# Patient Record
Sex: Male | Born: 1945 | Race: White | Hispanic: No | Marital: Married | Smoking: Former smoker
Health system: Southern US, Community
[De-identification: ages and names within clinical notes are randomized; demographics above are authoritative.]

## PROBLEM LIST (undated history)

## (undated) DIAGNOSIS — I219 Acute myocardial infarction, unspecified: Secondary | ICD-10-CM

## (undated) DIAGNOSIS — I10 Essential (primary) hypertension: Secondary | ICD-10-CM

## (undated) DIAGNOSIS — E039 Hypothyroidism, unspecified: Secondary | ICD-10-CM

## (undated) DIAGNOSIS — G629 Polyneuropathy, unspecified: Secondary | ICD-10-CM

## (undated) DIAGNOSIS — M109 Gout, unspecified: Secondary | ICD-10-CM

## (undated) DIAGNOSIS — T7840XA Allergy, unspecified, initial encounter: Secondary | ICD-10-CM

## (undated) DIAGNOSIS — I639 Cerebral infarction, unspecified: Secondary | ICD-10-CM

## (undated) DIAGNOSIS — E669 Obesity, unspecified: Secondary | ICD-10-CM

## (undated) DIAGNOSIS — E785 Hyperlipidemia, unspecified: Secondary | ICD-10-CM

## (undated) DIAGNOSIS — M1990 Unspecified osteoarthritis, unspecified site: Secondary | ICD-10-CM

## (undated) DIAGNOSIS — R7303 Prediabetes: Secondary | ICD-10-CM

## (undated) DIAGNOSIS — K219 Gastro-esophageal reflux disease without esophagitis: Secondary | ICD-10-CM

## (undated) DIAGNOSIS — K635 Polyp of colon: Secondary | ICD-10-CM

## (undated) DIAGNOSIS — N200 Calculus of kidney: Secondary | ICD-10-CM

## (undated) DIAGNOSIS — N289 Disorder of kidney and ureter, unspecified: Secondary | ICD-10-CM

## (undated) DIAGNOSIS — E1169 Type 2 diabetes mellitus with other specified complication: Secondary | ICD-10-CM

## (undated) DIAGNOSIS — K760 Fatty (change of) liver, not elsewhere classified: Secondary | ICD-10-CM

## (undated) DIAGNOSIS — G709 Myoneural disorder, unspecified: Secondary | ICD-10-CM

## (undated) DIAGNOSIS — K5792 Diverticulitis of intestine, part unspecified, without perforation or abscess without bleeding: Secondary | ICD-10-CM

## (undated) DIAGNOSIS — I2510 Atherosclerotic heart disease of native coronary artery without angina pectoris: Secondary | ICD-10-CM

## (undated) DIAGNOSIS — E7800 Pure hypercholesterolemia, unspecified: Secondary | ICD-10-CM

## (undated) DIAGNOSIS — C4491 Basal cell carcinoma of skin, unspecified: Secondary | ICD-10-CM

## (undated) DIAGNOSIS — U071 COVID-19: Secondary | ICD-10-CM

## (undated) DIAGNOSIS — G459 Transient cerebral ischemic attack, unspecified: Secondary | ICD-10-CM

## (undated) DIAGNOSIS — H409 Unspecified glaucoma: Secondary | ICD-10-CM

## (undated) DIAGNOSIS — I519 Heart disease, unspecified: Secondary | ICD-10-CM

## (undated) DIAGNOSIS — G4730 Sleep apnea, unspecified: Secondary | ICD-10-CM

## (undated) HISTORY — DX: Type 2 diabetes mellitus with other specified complication: E11.69

## (undated) HISTORY — DX: Sleep apnea, unspecified: G47.30

## (undated) HISTORY — DX: Type 2 diabetes mellitus with other specified complication: E78.5

## (undated) HISTORY — PX: EYE SURGERY: SHX253

## (undated) HISTORY — DX: COVID-19: U07.1

## (undated) HISTORY — DX: Fatty (change of) liver, not elsewhere classified: K76.0

## (undated) HISTORY — PX: MOHS SURGERY: SHX181

## (undated) HISTORY — DX: Type 2 diabetes mellitus with other specified complication: E66.9

## (undated) HISTORY — DX: Gastro-esophageal reflux disease without esophagitis: K21.9

## (undated) HISTORY — DX: Cerebral infarction, unspecified: I63.9

## (undated) HISTORY — DX: Polyp of colon: K63.5

## (undated) HISTORY — DX: Unspecified osteoarthritis, unspecified site: M19.90

## (undated) HISTORY — DX: Transient cerebral ischemic attack, unspecified: G45.9

## (undated) HISTORY — PX: CARPAL TUNNEL RELEASE: SHX101

## (undated) HISTORY — DX: Allergy, unspecified, initial encounter: T78.40XA

## (undated) HISTORY — DX: Heart disease, unspecified: I51.9

## (undated) HISTORY — DX: Prediabetes: R73.03

## (undated) HISTORY — DX: Myoneural disorder, unspecified: G70.9

## (undated) HISTORY — DX: Basal cell carcinoma of skin, unspecified: C44.91

## (undated) HISTORY — DX: Diverticulitis of intestine, part unspecified, without perforation or abscess without bleeding: K57.92

---

## 1988-09-27 DIAGNOSIS — H409 Unspecified glaucoma: Secondary | ICD-10-CM | POA: Insufficient documentation

## 1988-09-27 DIAGNOSIS — T7840XA Allergy, unspecified, initial encounter: Secondary | ICD-10-CM

## 1988-09-27 HISTORY — DX: Allergy, unspecified, initial encounter: T78.40XA

## 1993-09-27 DIAGNOSIS — G709 Myoneural disorder, unspecified: Secondary | ICD-10-CM

## 1993-09-27 HISTORY — DX: Myoneural disorder, unspecified: G70.9

## 2000-09-27 DIAGNOSIS — I639 Cerebral infarction, unspecified: Secondary | ICD-10-CM

## 2000-09-27 HISTORY — DX: Cerebral infarction, unspecified: I63.9

## 2006-01-29 DIAGNOSIS — S83209A Unspecified tear of unspecified meniscus, current injury, unspecified knee, initial encounter: Secondary | ICD-10-CM

## 2006-01-29 HISTORY — DX: Unspecified tear of unspecified meniscus, current injury, unspecified knee, initial encounter: S83.209A

## 2006-01-29 HISTORY — PX: KNEE ARTHROSCOPY: SUR90

## 2008-09-27 DIAGNOSIS — M1990 Unspecified osteoarthritis, unspecified site: Secondary | ICD-10-CM

## 2008-09-27 HISTORY — DX: Unspecified osteoarthritis, unspecified site: M19.90

## 2009-04-01 HISTORY — PX: LESION EXCISION: SHX5167

## 2010-09-16 HISTORY — PX: CORONARY ANGIOPLASTY WITH STENT PLACEMENT: SHX49

## 2012-02-16 HISTORY — PX: CARDIAC CATHETERIZATION: SHX172

## 2016-09-20 DIAGNOSIS — N200 Calculus of kidney: Secondary | ICD-10-CM

## 2016-09-20 HISTORY — DX: Calculus of kidney: N20.0

## 2016-09-21 ENCOUNTER — Emergency Department (HOSPITAL_BASED_OUTPATIENT_CLINIC_OR_DEPARTMENT_OTHER): Payer: Medicare Other

## 2016-09-21 ENCOUNTER — Encounter (HOSPITAL_BASED_OUTPATIENT_CLINIC_OR_DEPARTMENT_OTHER): Payer: Self-pay | Admitting: *Deleted

## 2016-09-21 ENCOUNTER — Emergency Department (HOSPITAL_BASED_OUTPATIENT_CLINIC_OR_DEPARTMENT_OTHER)
Admission: EM | Admit: 2016-09-21 | Discharge: 2016-09-21 | Disposition: A | Discharge status: Home / Self Care | Payer: Medicare Other | Attending: Emergency Medicine | Admitting: Emergency Medicine

## 2016-09-21 DIAGNOSIS — I2510 Atherosclerotic heart disease of native coronary artery without angina pectoris: Secondary | ICD-10-CM | POA: Insufficient documentation

## 2016-09-21 DIAGNOSIS — Z79899 Other long term (current) drug therapy: Secondary | ICD-10-CM | POA: Insufficient documentation

## 2016-09-21 DIAGNOSIS — N201 Calculus of ureter: Secondary | ICD-10-CM | POA: Insufficient documentation

## 2016-09-21 DIAGNOSIS — E039 Hypothyroidism, unspecified: Secondary | ICD-10-CM | POA: Diagnosis not present

## 2016-09-21 DIAGNOSIS — R109 Unspecified abdominal pain: Secondary | ICD-10-CM | POA: Diagnosis present

## 2016-09-21 DIAGNOSIS — I10 Essential (primary) hypertension: Secondary | ICD-10-CM | POA: Insufficient documentation

## 2016-09-21 DIAGNOSIS — I252 Old myocardial infarction: Secondary | ICD-10-CM | POA: Diagnosis not present

## 2016-09-21 DIAGNOSIS — Z87891 Personal history of nicotine dependence: Secondary | ICD-10-CM | POA: Insufficient documentation

## 2016-09-21 HISTORY — DX: Gout, unspecified: M10.9

## 2016-09-21 HISTORY — DX: Atherosclerotic heart disease of native coronary artery without angina pectoris: I25.10

## 2016-09-21 HISTORY — DX: Acute myocardial infarction, unspecified: I21.9

## 2016-09-21 HISTORY — DX: Pure hypercholesterolemia, unspecified: E78.00

## 2016-09-21 HISTORY — DX: Essential (primary) hypertension: I10

## 2016-09-21 HISTORY — DX: Polyneuropathy, unspecified: G62.9

## 2016-09-21 HISTORY — DX: Disorder of kidney and ureter, unspecified: N28.9

## 2016-09-21 HISTORY — DX: Hypothyroidism, unspecified: E03.9

## 2016-09-21 HISTORY — DX: Calculus of kidney: N20.0

## 2016-09-21 HISTORY — DX: Unspecified glaucoma: H40.9

## 2016-09-21 LAB — URINALYSIS, ROUTINE W REFLEX MICROSCOPIC
Bilirubin Urine: NEGATIVE
Glucose, UA: NEGATIVE mg/dL
Ketones, ur: NEGATIVE mg/dL
LEUKOCYTES UA: NEGATIVE
NITRITE: NEGATIVE
PROTEIN: 30 mg/dL — AB
SPECIFIC GRAVITY, URINE: 1.018 (ref 1.005–1.030)
pH: 5 (ref 5.0–8.0)

## 2016-09-21 LAB — CBC
HCT: 41.4 % (ref 39.0–52.0)
HEMOGLOBIN: 13.8 g/dL (ref 13.0–17.0)
MCH: 30.3 pg (ref 26.0–34.0)
MCHC: 33.3 g/dL (ref 30.0–36.0)
MCV: 91 fL (ref 78.0–100.0)
PLATELETS: 155 10*3/uL (ref 150–400)
RBC: 4.55 MIL/uL (ref 4.22–5.81)
RDW: 13.2 % (ref 11.5–15.5)
WBC: 7 10*3/uL (ref 4.0–10.5)

## 2016-09-21 LAB — BASIC METABOLIC PANEL
ANION GAP: 7 (ref 5–15)
BUN: 24 mg/dL — AB (ref 6–20)
CHLORIDE: 109 mmol/L (ref 101–111)
CO2: 24 mmol/L (ref 22–32)
Calcium: 8.7 mg/dL — ABNORMAL LOW (ref 8.9–10.3)
Creatinine, Ser: 1.12 mg/dL (ref 0.61–1.24)
Glucose, Bld: 200 mg/dL — ABNORMAL HIGH (ref 65–99)
POTASSIUM: 3.7 mmol/L (ref 3.5–5.1)
SODIUM: 140 mmol/L (ref 135–145)

## 2016-09-21 LAB — URINALYSIS, MICROSCOPIC (REFLEX)

## 2016-09-21 MED ORDER — TAMSULOSIN HCL 0.4 MG PO CAPS: 0.4000 mg | ORAL_CAPSULE | Freq: Every day | ORAL | 0 refills | Status: DC

## 2016-09-21 MED ORDER — OXYCODONE-ACETAMINOPHEN 5-325 MG PO TABS: 1.0000 | ORAL_TABLET | Freq: Four times a day (QID) | ORAL | 0 refills | Status: DC | PRN

## 2016-09-21 MED ORDER — MORPHINE SULFATE (PF) 4 MG/ML IV SOLN
4.0000 mg | Freq: Once | INTRAVENOUS | Status: DC
  Filled 2016-09-21: qty 1

## 2016-09-21 MED ORDER — ONDANSETRON HCL 4 MG/2ML IJ SOLN
4.0000 mg | Freq: Once | INTRAMUSCULAR | Status: DC
  Filled 2016-09-21: qty 2

## 2016-09-21 MED ORDER — ONDANSETRON HCL 4 MG PO TABS: 4.0000 mg | ORAL_TABLET | Freq: Four times a day (QID) | ORAL | 0 refills | Status: DC

## 2016-09-21 MED FILL — TAMSULOSIN HCL 0.4 MG CAP: 0.4 | 14 days supply | Qty: 14 | Fill #0

## 2016-09-21 MED FILL — ONDANSETRON HCL 4 MG TABLET: 4 | 3 days supply | Qty: 12 | Fill #0

## 2016-09-21 MED FILL — OXYCODONE/APAP 5-325: 5-325 | 2 days supply | Qty: 20 | Fill #0

## 2016-09-21 NOTE — Discharge Instructions (Signed)
Return to the ED with any concerns including pain not controlled by medications, vomiting and not able to keep down liquids, fever/chills, decreased level of alertness/lethargy, or any other alarming symptoms

## 2016-09-21 NOTE — ED Provider Notes (Signed)
[facility] Provider Note   CSN: [id number] Arrival date & time: 09/21/16  0831     History   Chief Complaint Chief Complaint  Patient presents with  . Flank Pain    HPI Desiree Estabrooks is a 70 y.o. male.  HPI  Pt presenting with c/o right flank pain, symptoms began last night, they are waxing and waning in nature- worse again this morning with nausea.  At one point radiated down to right groin.  Last night he saw his urine was dark brown.  No difficulty urinating.  No fever/chills. No vomiting.  He has a hx of stones in the past last one approx 30 years ago.  There are no other associated systemic symptoms, there are no other alleviating or modifying factors.   Past Medical History:  Diagnosis Date  . Coronary artery disease   . Glaucoma   . Gout   . Hypercholesteremia   . Hypertension   . Hypothyroid   . Kidney stones   . Myocardial infarction   . Neuropathy ([place])   . Renal disorder     There are no active problems to display for this patient.   History reviewed. No pertinent surgical history.     Home Medications    Prior to Admission medications   Medication Sig Start Date End Date Taking? Authorizing Provider  allopurinol (ZYLOPRIM) 100 MG tablet Take 200 mg by mouth Nightly.   Yes Historical Provider, MD  allopurinol (ZYLOPRIM) 300 MG tablet Take 300 mg by mouth daily.   Yes Historical Provider, MD  atorvastatin (LIPITOR) 40 MG tablet Take 40 mg by mouth daily.   Yes Historical Provider, MD  buPROPion (ZYBAN) 150 MG 12 hr tablet Take 150 mg by mouth 2 (two) times daily.   Yes Historical Provider, MD  clopidogrel (PLAVIX) 75 MG tablet Take 75 mg by mouth daily.   Yes Historical Provider, MD  desloratadine (CLARINEX) 5 MG tablet Take 5 mg by mouth daily.   Yes Historical Provider, MD  DULoxetine (CYMBALTA) 60 MG capsule Take 90 mg by mouth daily.   Yes Historical Provider, MD  levothyroxine (SYNTHROID, LEVOTHROID) 175 MCG tablet Take 175 mcg by  mouth daily before breakfast.   Yes Historical Provider, MD  metoprolol succinate (TOPROL-XL) 50 MG 24 hr tablet Take 50 mg by mouth daily. Take with or immediately following a meal.   Yes Historical Provider, MD  pantoprazole (PROTONIX) 40 MG tablet Take 40 mg by mouth daily.   Yes Historical Provider, MD  valsartan (DIOVAN) 320 MG tablet Take 320 mg by mouth daily.   Yes Historical Provider, MD  ondansetron (ZOFRAN) 4 MG tablet Take 1 tablet (4 mg total) by mouth every 6 (six) hours. 09/21/16   Alfonzo Beers, MD  oxyCODONE-acetaminophen (PERCOCET/ROXICET) 5-325 MG tablet Take 1-2 tablets by mouth every 6 (six) hours as needed for severe pain. 09/21/16   Alfonzo Beers, MD  tamsulosin (FLOMAX) 0.4 MG CAPS capsule Take 1 capsule (0.4 mg total) by mouth daily. 09/21/16   Alfonzo Beers, MD    Family History No family history on file.  Social History Social History  Substance Use Topics  . Smoking status: Former Smoker    Quit date: 09/21/1986  . Smokeless tobacco: Never Used  . Alcohol use Not on file     Allergies   Celebrex [celecoxib] and Iodine   Review of Systems Review of Systems  ROS reviewed and all otherwise negative except for mentioned in HPI   Physical Exam  Updated Vital Signs BP 118/68 (BP Location: Right Arm)   Pulse 81   Temp 97.7 F (36.5 C) (Oral)   Resp 18   Ht 5\' 9"  (1.753 m)   Wt 265 lb (120.2 kg)   SpO2 98%   BMI 39.13 kg/m  Vitals reviewed Physical Exam Physical Examination: General appearance - alert, well appearing, and in no distress Mental status - alert, oriented to person, place, and time Eyes - no conjunctival injection, no scleral icterus Chest - clear to auscultation, no wheezes, rales or rhonchi, symmetric air entry Heart - normal rate, regular rhythm, normal S1, S2, no murmurs, rubs, clicks or gallops Abdomen - soft, nontender, nondistended, no masses or organomegaly Back exam - right CVA tenderness Neurological - alert,  oriented Extremities - peripheral pulses normal, no pedal edema, no clubbing or cyanosis Skin - normal coloration and turgor, no rashes  ED Treatments / Results  Labs (all labs ordered are listed, but only abnormal results are displayed) Labs Reviewed  BASIC METABOLIC PANEL - Abnormal; Notable for the following:       Result Value   Glucose, Bld 200 (*)    BUN 24 (*)    Calcium 8.7 (*)    All other components within normal limits  URINALYSIS, ROUTINE W REFLEX MICROSCOPIC - Abnormal; Notable for the following:    Color, Urine AMBER (*)    APPearance TURBID (*)    Hgb urine dipstick LARGE (*)    Protein, ur 30 (*)    All other components within normal limits  URINALYSIS, MICROSCOPIC (REFLEX) - Abnormal; Notable for the following:    Bacteria, UA FEW (*)    Squamous Epithelial / LPF 6-30 (*)    All other components within normal limits  CBC    EKG  EKG Interpretation None       Radiology Ct Renal Stone Study  Result Date: 09/21/2016 CLINICAL DATA:  Flank pain with hematuria EXAM: CT ABDOMEN AND PELVIS WITHOUT CONTRAST TECHNIQUE: Multidetector CT imaging of the abdomen and pelvis was performed following the standard protocol without oral or intravenous contrast material administration. COMPARISON:  None. FINDINGS: Lower chest: Lung bases are clear. Hepatobiliary: Liver measures 21.3 cm in length. No focal lesions are evident on this noncontrast enhanced study. Gallbladder wall is not appreciably thickened. There is no biliary duct dilatation. Pancreas: No pancreatic mass or inflammatory focus evident. Spleen: No splenic lesions evident. Adrenals/Urinary Tract: Adrenals appear unremarkable bilaterally. There is no appreciable renal mass on either side. There is mild hydronephrosis on the right. There is no appreciable hydronephrosis on the left. There is no intrarenal calculus on either side. There is a 4 mm calculus in the right ureter at the level of L4. No other ureteral calculus  is evident on either side. There is mild generalized thickening of the urinary bladder wall. Stomach/Bowel: There are multiple sigmoid diverticula without diverticulitis. There is no bowel wall or mesenteric thickening. There is no evident bowel obstruction. No free air or portal venous air. Vascular/Lymphatic: There is atherosclerotic calcification in the aorta and common iliac arteries. There is also hypogastric artery calcification. There is no abdominal aortic aneurysm. Major mesenteric vessels appear patent. There is no adenopathy in the abdomen or pelvis. Reproductive: There are a few tiny prostatic calculi. Prostate and seminal vesicles are normal in size and contour. There is no pelvic mass or pelvic fluid collection. Other: Appendix appears normal. There is no ascites or abscess in the abdomen or pelvis. Musculoskeletal: There is degenerative change  in lumbar and lower thoracic spine regions. There is spinal stenosis at L4-5 due to bony hypertrophy as well as diffuse disc protrusion. There is no intramuscular or abdominal wall lesion. There is lumbar levoscoliosis. IMPRESSION: 4 mm calculus in the left ureter at the L4 level. There is mild hydronephrosis on the left. There is urinary bladder wall thickening, felt to represent a degree of cystitis. There are multiple sigmoid diverticula without diverticulitis. No bowel obstruction. No abscess. Appendix appears normal. 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 Prominent liver without focal lesion evident. Spinal stenosis at L4-5, multifactorial. Electronically Signed   By: Lowella Grip III M.D.   On: 09/21/2016 09:15    Procedures Procedures (including critical care time)  Medications Ordered in ED Medications  morphine 4 MG/ML injection 4 mg (not administered)  ondansetron (ZOFRAN) injection 4 mg (not administered)     Initial Impression / Assessment and Plan / ED Course  I have reviewed the triage vital signs and the nursing  notes.  Pertinent labs & imaging results that were available during my care of the patient were reviewed by me and considered in my medical decision making (see chart for details).  Clinical Course     Pt presenting with c/o right flank pain and dark urine.  Pt has remote hx of renal stones.  Labs reassuring, CT shows right ureteral stone.  Urine does not appear to be infected.  Pt has not needed pain medication in the ED.  Discharged with pain med rx, antinausea rx as well as flomax- d/w patient this similar to celebrex- so he may have ankle swelling- if he does stop the medication.  He did not have anaphylaxis to celebrex in the past.  Pt advised to call for followup appointment with urology.  Discharged with strict return precautions.  Pt agreeable with plan.  Final Clinical Impressions(s) / ED Diagnoses   Final diagnoses:  Right ureteral stone    New Prescriptions Discharge Medication List as of 09/21/2016 10:13 AM    START taking these medications   Details  ondansetron (ZOFRAN) 4 MG tablet Take 1 tablet (4 mg total) by mouth every 6 (six) hours., Starting Tue 09/21/2016, Print    oxyCODONE-acetaminophen (PERCOCET/ROXICET) 5-325 MG tablet Take 1-2 tablets by mouth every 6 (six) hours as needed for severe pain., Starting Tue 09/21/2016, Print    tamsulosin (FLOMAX) 0.4 MG CAPS capsule Take 1 capsule (0.4 mg total) by mouth daily., Starting Tue 09/21/2016, Print         Alfonzo Beers, MD 09/21/16 [phone]

## 2016-09-21 NOTE — ED Notes (Signed)
Pt refused morphine and zofran, states he does not need it right now.

## 2016-09-21 NOTE — ED Triage Notes (Signed)
C/o right flank since yesterday am. Last pm pain went into groin. Varies in intensity. States pain is just an ache now. Nausea no vomiting or fever.

## 2016-09-22 ENCOUNTER — Emergency Department (HOSPITAL_BASED_OUTPATIENT_CLINIC_OR_DEPARTMENT_OTHER)
Admission: EM | Admit: 2016-09-22 | Discharge: 2016-09-22 | Disposition: A | Discharge status: Home / Self Care | Payer: Medicare Other | Attending: Emergency Medicine | Admitting: Emergency Medicine

## 2016-09-22 ENCOUNTER — Encounter (HOSPITAL_BASED_OUTPATIENT_CLINIC_OR_DEPARTMENT_OTHER): Payer: Self-pay | Admitting: *Deleted

## 2016-09-22 DIAGNOSIS — Z87891 Personal history of nicotine dependence: Secondary | ICD-10-CM | POA: Diagnosis not present

## 2016-09-22 DIAGNOSIS — Z7982 Long term (current) use of aspirin: Secondary | ICD-10-CM | POA: Diagnosis not present

## 2016-09-22 DIAGNOSIS — N201 Calculus of ureter: Secondary | ICD-10-CM | POA: Insufficient documentation

## 2016-09-22 DIAGNOSIS — I252 Old myocardial infarction: Secondary | ICD-10-CM | POA: Diagnosis not present

## 2016-09-22 DIAGNOSIS — E039 Hypothyroidism, unspecified: Secondary | ICD-10-CM | POA: Insufficient documentation

## 2016-09-22 DIAGNOSIS — Z79899 Other long term (current) drug therapy: Secondary | ICD-10-CM | POA: Insufficient documentation

## 2016-09-22 DIAGNOSIS — I2510 Atherosclerotic heart disease of native coronary artery without angina pectoris: Secondary | ICD-10-CM | POA: Insufficient documentation

## 2016-09-22 DIAGNOSIS — I10 Essential (primary) hypertension: Secondary | ICD-10-CM | POA: Insufficient documentation

## 2016-09-22 DIAGNOSIS — R109 Unspecified abdominal pain: Secondary | ICD-10-CM | POA: Diagnosis present

## 2016-09-22 LAB — URINALYSIS, ROUTINE W REFLEX MICROSCOPIC
Glucose, UA: NEGATIVE mg/dL
KETONES UR: 15 mg/dL — AB
NITRITE: NEGATIVE
PROTEIN: 100 mg/dL — AB
Specific Gravity, Urine: 1.026 (ref 1.005–1.030)
pH: 5 (ref 5.0–8.0)

## 2016-09-22 LAB — URINALYSIS, MICROSCOPIC (REFLEX)

## 2016-09-22 LAB — BASIC METABOLIC PANEL
Anion gap: 8 (ref 5–15)
BUN: 25 mg/dL — ABNORMAL HIGH (ref 6–20)
CALCIUM: 9 mg/dL (ref 8.9–10.3)
CHLORIDE: 108 mmol/L (ref 101–111)
CO2: 24 mmol/L (ref 22–32)
CREATININE: 1.5 mg/dL — AB (ref 0.61–1.24)
GFR calc non Af Amer: 46 mL/min — ABNORMAL LOW (ref >60)
GFR, EST AFRICAN AMERICAN: 53 mL/min — AB (ref >60)
Glucose, Bld: 163 mg/dL — ABNORMAL HIGH (ref 65–99)
Potassium: 4.3 mmol/L (ref 3.5–5.1)
SODIUM: 140 mmol/L (ref 135–145)

## 2016-09-22 MED ORDER — ONDANSETRON HCL 4 MG/2ML IJ SOLN
4.0000 mg | Freq: Once | INTRAMUSCULAR | Status: AC
  Administered 2016-09-22: 4 mg via INTRAVENOUS
  Filled 2016-09-22: qty 2

## 2016-09-22 MED ORDER — HYDROMORPHONE HCL 2 MG PO TABS: 2.0000 mg | ORAL_TABLET | ORAL | 0 refills | Status: DC | PRN

## 2016-09-22 MED ORDER — HYDROMORPHONE HCL 1 MG/ML IJ SOLN
1.0000 mg | Freq: Once | INTRAMUSCULAR | Status: AC
Start: 2016-09-22 — End: 2016-09-22
  Administered 2016-09-22: 1 mg via INTRAVENOUS
  Filled 2016-09-22: qty 1

## 2016-09-22 MED ORDER — HYDROMORPHONE HCL 1 MG/ML IJ SOLN
1.0000 mg | Freq: Once | INTRAMUSCULAR | Status: AC
  Administered 2016-09-22: 1 mg via INTRAVENOUS
  Filled 2016-09-22: qty 1

## 2016-09-22 MED FILL — HYDROmorphone HCL 2 MG TABS: 2 | 3 days supply | Qty: 20 | Fill #0

## 2016-09-22 NOTE — ED Triage Notes (Signed)
Dx with kidney stone yesterday. Reports pain returned at 0445, worst so far. Took 2 percocet without relief. Also reports feeling constipated

## 2016-09-22 NOTE — ED Provider Notes (Signed)
[facility] Provider Note   CSN: [id number] Arrival date & time: 09/22/16  0754     History   Chief Complaint Chief Complaint  Patient presents with  . Flank Pain    HPI Whyatt Hipskind is a 70 y.o. male.  HPI  Pt with right ureteral stone diagnosed yesterday presenting with right flank pain.  Yesterday he had minimal pain and declined IV pain meds.  This morning approx 4am pain recurred- he took percocet at 5am and 6am without relief.  No fever/chills.  No vomiting.  Prior to my evaluation this morning patient urinated and now feels improved to approx 4/10- had been 9/10 on arrival.  There are no other associated systemic symptoms, there are no other alleviating or modifying factors.   Past Medical History:  Diagnosis Date  . Coronary artery disease   . Glaucoma   . Gout   . Hypercholesteremia   . Hypertension   . Hypothyroid   . Kidney stones   . Myocardial infarction   . Neuropathy ([place])   . Renal disorder     There are no active problems to display for this patient.   Past Surgical History:  Procedure Laterality Date  . CARPAL TUNNEL RELEASE    . MOHS SURGERY         Home Medications    Prior to Admission medications   Medication Sig Start Date End Date Taking? Authorizing Provider  allopurinol (ZYLOPRIM) 100 MG tablet Take 200 mg by mouth Nightly.   Yes Historical Provider, MD  allopurinol (ZYLOPRIM) 300 MG tablet Take 300 mg by mouth daily.   Yes Historical Provider, MD  Alpha-Lipoic Acid 300 MG CAPS Take by mouth 2 (two) times daily.   Yes Historical Provider, MD  aspirin 81 MG chewable tablet Chew by mouth daily.   Yes Historical Provider, MD  atorvastatin (LIPITOR) 40 MG tablet Take 40 mg by mouth daily.   Yes Historical Provider, MD  bimatoprost (LUMIGAN) 0.03 % ophthalmic solution 1 drop at bedtime.   Yes Historical Provider, MD  brimonidine-timolol (COMBIGAN) 0.2-0.5 % ophthalmic solution Place 1 drop into both eyes every 12 (twelve)  hours.   Yes Historical Provider, MD  brinzolamide (AZOPT) 1 % ophthalmic suspension Place 1 drop into the right eye 2 (two) times daily.   Yes Historical Provider, MD  buPROPion (ZYBAN) 150 MG 12 hr tablet Take 150 mg by mouth daily.    Yes Historical Provider, MD  clopidogrel (PLAVIX) 75 MG tablet Take 75 mg by mouth daily.   Yes Historical Provider, MD  desloratadine (CLARINEX) 5 MG tablet Take 5 mg by mouth daily.   Yes Historical Provider, MD  DULoxetine (CYMBALTA) 60 MG capsule Take 90 mg by mouth daily.   Yes Historical Provider, MD  levothyroxine (SYNTHROID, LEVOTHROID) 175 MCG tablet Take 175 mcg by mouth daily before breakfast.   Yes Historical Provider, MD  metoprolol succinate (TOPROL-XL) 50 MG 24 hr tablet Take 50 mg by mouth daily. Take with or immediately following a meal.   Yes Historical Provider, MD  Multiple Vitamin (MULTIVITAMIN) capsule Take 1 capsule by mouth daily.   Yes Historical Provider, MD  Omega-3 Fatty Acids (EQL OMEGA 3 FISH OIL) 1400 MG CAPS Take by mouth 2 (two) times daily.   Yes Historical Provider, MD  ondansetron (ZOFRAN) 4 MG tablet Take 1 tablet (4 mg total) by mouth every 6 (six) hours. 09/21/16  Yes Alfonzo Beers, MD  oxyCODONE-acetaminophen (PERCOCET/ROXICET) 5-325 MG tablet Take 1-2 tablets by  mouth every 6 (six) hours as needed for severe pain. 09/21/16  Yes Alfonzo Beers, MD  pantoprazole (PROTONIX) 40 MG tablet Take 40 mg by mouth daily.   Yes Historical Provider, MD  Probiotic Product (PROBIOTIC PO) Take by mouth daily.   Yes Historical Provider, MD  tamsulosin (FLOMAX) 0.4 MG CAPS capsule Take 1 capsule (0.4 mg total) by mouth daily. 09/21/16  Yes Alfonzo Beers, MD  valsartan (DIOVAN) 320 MG tablet Take 320 mg by mouth daily.   Yes Historical Provider, MD  vitamin E 400 UNIT capsule Take 400 Units by mouth daily.   Yes Historical Provider, MD  HYDROmorphone (DILAUDID) 2 MG tablet Take 1 tablet (2 mg total) by mouth every 4 (four) hours as needed for  severe pain. 09/22/16   Alfonzo Beers, MD    Family History No family history on file.  Social History Social History  Substance Use Topics  . Smoking status: Former Smoker    Quit date: 09/21/1986  . Smokeless tobacco: Never Used  . Alcohol use Yes     Comment: occasional     Allergies   Celebrex [celecoxib] and Iodine   Review of Systems Review of Systems  ROS reviewed and all otherwise negative except for mentioned in HPI   Physical Exam Updated Vital Signs BP 145/82   Pulse 77   Temp 97.8 F (36.6 C) (Oral)   Resp 18   Ht 5\' 9"  (1.753 m)   Wt 265 lb (120.2 kg)   SpO2 97%   BMI 39.13 kg/m  Vitals reviewed Physical Exam Physical Examination: General appearance - alert, well appearing, and in no distress Mental status - alert, oriented to person, place, and time Eyes - no conjunctival injection, no scleral icterus Chest - clear to auscultation, no wheezes, rales or rhonchi, symmetric air entry Heart - normal rate, regular rhythm, normal S1, S2, no murmurs, rubs, clicks or gallops Abdomen - soft, nontender, nondistended, no masses or organomegaly Back exam  Right sided CVA tenderness Neurological - alert, oriented, normal speech Extremities - peripheral pulses normal, no pedal edema, no clubbing or cyanosis Skin - normal coloration and turgor, no rashes  ED Treatments / Results  Labs (all labs ordered are listed, but only abnormal results are displayed) Labs Reviewed  BASIC METABOLIC PANEL - Abnormal; Notable for the following:       Result Value   Glucose, Bld 163 (*)    BUN 25 (*)    Creatinine, Ser 1.50 (*)    GFR calc non Af Amer 46 (*)    GFR calc Af Amer 53 (*)    All other components within normal limits  URINALYSIS, ROUTINE W REFLEX MICROSCOPIC - Abnormal; Notable for the following:    Color, Urine RED (*)    APPearance TURBID (*)    Hgb urine dipstick LARGE (*)    Bilirubin Urine MODERATE (*)    Ketones, ur 15 (*)    Protein, ur 100 (*)      Leukocytes, UA SMALL (*)    All other components within normal limits  URINALYSIS, MICROSCOPIC (REFLEX) - Abnormal; Notable for the following:    Bacteria, UA MANY (*)    Squamous Epithelial / LPF 0-5 (*)    All other components within normal limits  URINE CULTURE    EKG  EKG Interpretation None       Radiology Ct Renal Stone Study  Result Date: 09/21/2016 CLINICAL DATA:  Flank pain with hematuria EXAM: CT ABDOMEN AND PELVIS WITHOUT  CONTRAST TECHNIQUE: Multidetector CT imaging of the abdomen and pelvis was performed following the standard protocol without oral or intravenous contrast material administration. COMPARISON:  None. FINDINGS: Lower chest: Lung bases are clear. Hepatobiliary: Liver measures 21.3 cm in length. No focal lesions are evident on this noncontrast enhanced study. Gallbladder wall is not appreciably thickened. There is no biliary duct dilatation. Pancreas: No pancreatic mass or inflammatory focus evident. Spleen: No splenic lesions evident. Adrenals/Urinary Tract: Adrenals appear unremarkable bilaterally. There is no appreciable renal mass on either side. There is mild hydronephrosis on the right. There is no appreciable hydronephrosis on the left. There is no intrarenal calculus on either side. There is a 4 mm calculus in the right ureter at the level of L4. No other ureteral calculus is evident on either side. There is mild generalized thickening of the urinary bladder wall. Stomach/Bowel: There are multiple sigmoid diverticula without diverticulitis. There is no bowel wall or mesenteric thickening. There is no evident bowel obstruction. No free air or portal venous air. Vascular/Lymphatic: There is atherosclerotic calcification in the aorta and common iliac arteries. There is also hypogastric artery calcification. There is no abdominal aortic aneurysm. Major mesenteric vessels appear patent. There is no adenopathy in the abdomen or pelvis. Reproductive: There are a few  tiny prostatic calculi. Prostate and seminal vesicles are normal in size and contour. There is no pelvic mass or pelvic fluid collection. Other: Appendix appears normal. There is no ascites or abscess in the abdomen or pelvis. Musculoskeletal: There is degenerative change in lumbar and lower thoracic spine regions. There is spinal stenosis at L4-5 due to bony hypertrophy as well as diffuse disc protrusion. There is no intramuscular or abdominal wall lesion. There is lumbar levoscoliosis. IMPRESSION: 4 mm calculus in the left ureter at the L4 level. There is mild hydronephrosis on the left. There is urinary bladder wall thickening, felt to represent a degree of cystitis. There are multiple sigmoid diverticula without diverticulitis. No bowel obstruction. No abscess. Appendix appears normal. 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 Prominent liver without focal lesion evident. Spinal stenosis at L4-5, multifactorial. Electronically Signed   By: Lowella Grip III M.D.   On: 09/21/2016 09:15    Procedures Procedures (including critical care time)  Medications Ordered in ED Medications  HYDROmorphone (DILAUDID) injection 1 mg (1 mg Intravenous Given 09/22/16 0839)  ondansetron (ZOFRAN) injection 4 mg (4 mg Intravenous Given 09/22/16 0839)  HYDROmorphone (DILAUDID) injection 1 mg (1 mg Intravenous Given 09/22/16 1015)     Initial Impression / Assessment and Plan / ED Course  I have reviewed the triage vital signs and the nursing notes.  Pertinent labs & imaging results that were available during my care of the patient were reviewed by me and considered in my medical decision making (see chart for details).  Clinical Course   9:52 AM creatinine has increased from 1.1 to 1.5 since yesterday.  D/w Dr. Wendy Poet, urology- he states this is alright, recommends increasing pain medication to po dilaudid and call his office- they will get him in for a visit tomorrow.    Pt feels improved on recheck-  discussed plan with patient- he states he was planning to drive back to [place] tomorrrow to go home.  He will still call urology to see if they can see him in the morning.  He states he also has a doctor at home who has a urologist as part of their group.  He understands that he will definitely need to be rechecked  in the next couple of days.  Discharged with strict return precautions.  Pt agreeable with plan.   Final Clinical Impressions(s) / ED Diagnoses   Final diagnoses:  Right ureteral stone    New Prescriptions Discharge Medication List as of 09/22/2016 10:26 AM    START taking these medications   Details  HYDROmorphone (DILAUDID) 2 MG tablet Take 1 tablet (2 mg total) by mouth every 4 (four) hours as needed for severe pain., Starting Wed 09/22/2016, Print         Alfonzo Beers, MD 09/22/16 1134

## 2016-09-22 NOTE — Discharge Instructions (Signed)
Return to the ED with any concerns including vomiting and not able to keep down liquids, fever/chills, pain not controlled by pain medication, decreased level of alertness/lethargy, or any other alarming symptoms

## 2016-09-22 NOTE — ED Notes (Signed)
Pt up to BR to obtain urine. States pain is now a 4/10

## 2016-09-23 LAB — URINE CULTURE: CULTURE: NO GROWTH

## 2016-10-01 DIAGNOSIS — E039 Hypothyroidism, unspecified: Secondary | ICD-10-CM | POA: Insufficient documentation

## 2016-10-01 DIAGNOSIS — M1A9XX0 Chronic gout, unspecified, without tophus (tophi): Secondary | ICD-10-CM | POA: Insufficient documentation

## 2016-10-01 DIAGNOSIS — M109 Gout, unspecified: Secondary | ICD-10-CM | POA: Insufficient documentation

## 2016-10-01 DIAGNOSIS — I219 Acute myocardial infarction, unspecified: Secondary | ICD-10-CM | POA: Insufficient documentation

## 2016-10-01 DIAGNOSIS — I252 Old myocardial infarction: Secondary | ICD-10-CM | POA: Insufficient documentation

## 2016-10-01 DIAGNOSIS — E782 Mixed hyperlipidemia: Secondary | ICD-10-CM | POA: Insufficient documentation

## 2016-12-20 HISTORY — PX: COLONOSCOPY W/ POLYPECTOMY: SHX1380

## 2016-12-20 LAB — HM COLONOSCOPY

## 2018-09-27 DIAGNOSIS — G609 Hereditary and idiopathic neuropathy, unspecified: Secondary | ICD-10-CM | POA: Insufficient documentation

## 2020-03-06 DIAGNOSIS — I739 Peripheral vascular disease, unspecified: Secondary | ICD-10-CM | POA: Insufficient documentation

## 2020-03-29 DIAGNOSIS — R600 Localized edema: Secondary | ICD-10-CM | POA: Insufficient documentation

## 2020-03-29 DIAGNOSIS — M48061 Spinal stenosis, lumbar region without neurogenic claudication: Secondary | ICD-10-CM | POA: Insufficient documentation

## 2020-03-29 DIAGNOSIS — M1990 Unspecified osteoarthritis, unspecified site: Secondary | ICD-10-CM | POA: Insufficient documentation

## 2020-03-29 DIAGNOSIS — R748 Abnormal levels of other serum enzymes: Secondary | ICD-10-CM | POA: Insufficient documentation

## 2020-03-29 DIAGNOSIS — J309 Allergic rhinitis, unspecified: Secondary | ICD-10-CM | POA: Insufficient documentation

## 2020-03-29 DIAGNOSIS — K219 Gastro-esophageal reflux disease without esophagitis: Secondary | ICD-10-CM | POA: Insufficient documentation

## 2020-03-29 DIAGNOSIS — F32A Depression, unspecified: Secondary | ICD-10-CM | POA: Insufficient documentation

## 2020-03-29 DIAGNOSIS — G4733 Obstructive sleep apnea (adult) (pediatric): Secondary | ICD-10-CM | POA: Insufficient documentation

## 2020-03-29 DIAGNOSIS — R609 Edema, unspecified: Secondary | ICD-10-CM | POA: Insufficient documentation

## 2020-03-29 DIAGNOSIS — I10 Essential (primary) hypertension: Secondary | ICD-10-CM | POA: Insufficient documentation

## 2020-03-29 DIAGNOSIS — R7301 Impaired fasting glucose: Secondary | ICD-10-CM | POA: Insufficient documentation

## 2020-03-29 HISTORY — DX: Depression, unspecified: F32.A

## 2020-10-02 ENCOUNTER — Ambulatory Visit (INDEPENDENT_AMBULATORY_CARE_PROVIDER_SITE_OTHER): Payer: Medicare Other | Admitting: Podiatry

## 2020-10-02 ENCOUNTER — Other Ambulatory Visit: Payer: Self-pay

## 2020-10-02 ENCOUNTER — Encounter: Payer: Self-pay | Admitting: Podiatry

## 2020-10-02 DIAGNOSIS — M79675 Pain in left toe(s): Secondary | ICD-10-CM | POA: Diagnosis not present

## 2020-10-02 DIAGNOSIS — B351 Tinea unguium: Secondary | ICD-10-CM | POA: Diagnosis not present

## 2020-10-02 DIAGNOSIS — M79674 Pain in right toe(s): Secondary | ICD-10-CM | POA: Diagnosis not present

## 2020-10-02 DIAGNOSIS — L603 Nail dystrophy: Secondary | ICD-10-CM | POA: Diagnosis not present

## 2020-10-02 NOTE — Progress Notes (Signed)
  Subjective:  Patient ID: Juan Stanton, male    DOB: 1945-11-02,  MRN: [id number]  Chief Complaint  Patient presents with  . routine foot care    Nail trim. Possible nail fungus. PT stated that he does have neuropathy but he is not a diabetic .    75 y.o. male presents with the above complaint. History confirmed with patient.  Recently moved here from [place].  Previously had nail fungus and it was recommended by Dr. There are to have permanent nail matricectomy's of all the toenails completely.  This was not fully successful and only partially removed to the hallux and second toenails bilaterally.  He has residual nail dystrophy and onychomycosis.  Debridement has been helpful.  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  Not diabetic.  Objective:  Physical Exam: warm, good capillary refill, no trophic changes or ulcerative lesions and normal DP and PT pulses.  Absent light touch sensation and protective sensation distally.  He has nail dystrophy and residual onychomycosis of all nails Assessment:   1. Onychomycosis   2. Pain due to onychomycosis of toenails of both feet      Plan:  Patient was evaluated and treated and all questions answered.  Discussed the etiology and treatment options for the condition in detail with the patient. Educated patient on the topical and oral treatment options for mycotic nails. Recommended debridement of the nails today. Sharp and mechanical debridement performed of all painful and mycotic nails today. Nails debrided in length and thickness using a nail nipper. Discussed treatment options including appropriate shoe gear. Follow up as needed for painful nails.    Return in about 10 weeks (around 12/11/2020).

## 2020-10-15 DIAGNOSIS — E785 Hyperlipidemia, unspecified: Secondary | ICD-10-CM | POA: Insufficient documentation

## 2020-10-15 DIAGNOSIS — E789 Disorder of lipoprotein metabolism, unspecified: Secondary | ICD-10-CM | POA: Insufficient documentation

## 2020-12-11 ENCOUNTER — Other Ambulatory Visit: Payer: Self-pay

## 2020-12-11 ENCOUNTER — Ambulatory Visit (INDEPENDENT_AMBULATORY_CARE_PROVIDER_SITE_OTHER): Payer: Medicare HMO | Admitting: Podiatry

## 2020-12-11 DIAGNOSIS — B351 Tinea unguium: Secondary | ICD-10-CM

## 2020-12-11 DIAGNOSIS — M79675 Pain in left toe(s): Secondary | ICD-10-CM

## 2020-12-11 DIAGNOSIS — L84 Corns and callosities: Secondary | ICD-10-CM | POA: Diagnosis not present

## 2020-12-11 DIAGNOSIS — M79674 Pain in right toe(s): Secondary | ICD-10-CM | POA: Diagnosis not present

## 2020-12-11 DIAGNOSIS — G609 Hereditary and idiopathic neuropathy, unspecified: Secondary | ICD-10-CM | POA: Diagnosis not present

## 2020-12-15 NOTE — Progress Notes (Signed)
  Subjective:  Patient ID: Juan Stanton, male    DOB: July 18, 1946,  MRN: [id number]  Chief Complaint  Patient presents with  . Nail Problem    Nail trim     75 y.o. male returns with the above complaint. History confirmed with patient.  Doing well, no new issues Objective:  Physical Exam: warm, good capillary refill, no trophic changes or ulcerative lesions and normal DP and PT pulses.  Absent light touch sensation and protective sensation distally.  He has nail dystrophy and residual onychomycosis of all nails.  Callus present bilateral hallux  Assessment:   1. Onychomycosis   2. Pain due to onychomycosis of toenails of both feet   3. Callus of foot   4. Idiopathic neuropathy      Plan:  Patient was evaluated and treated and all questions answered.  Discussed the etiology and treatment options for the condition in detail with the patient. Educated patient on the topical and oral treatment options for mycotic nails. Recommended debridement of the nails today. Sharp and mechanical debridement performed of all painful and mycotic nails today. Nails debrided in length and thickness using a nail nipper. Discussed treatment options including appropriate shoe gear. Follow up as needed for painful nails.  All symptomatic hyperkeratoses were safely debrided with a sterile #15 blade to patient's level of comfort without incident. We discussed preventative and palliative care of these lesions including supportive and accommodative shoegear, padding, prefabricated and custom molded accommodative orthoses, use of a pumice stone and lotions/creams daily.    Return in about 3 months (around 03/13/2021) for at risk diabetic foot care.

## 2020-12-30 ENCOUNTER — Other Ambulatory Visit: Payer: Self-pay

## 2020-12-30 DIAGNOSIS — I2510 Atherosclerotic heart disease of native coronary artery without angina pectoris: Secondary | ICD-10-CM | POA: Insufficient documentation

## 2020-12-30 DIAGNOSIS — E669 Obesity, unspecified: Secondary | ICD-10-CM | POA: Insufficient documentation

## 2021-02-05 ENCOUNTER — Other Ambulatory Visit: Payer: Self-pay

## 2021-02-05 DIAGNOSIS — Z955 Presence of coronary angioplasty implant and graft: Secondary | ICD-10-CM | POA: Insufficient documentation

## 2021-03-02 ENCOUNTER — Encounter: Payer: Self-pay | Admitting: Family Medicine

## 2021-03-02 DIAGNOSIS — Z8249 Family history of ischemic heart disease and other diseases of the circulatory system: Secondary | ICD-10-CM | POA: Insufficient documentation

## 2021-03-03 ENCOUNTER — Other Ambulatory Visit: Payer: Self-pay

## 2021-03-03 ENCOUNTER — Encounter: Payer: Self-pay | Admitting: Family Medicine

## 2021-03-03 ENCOUNTER — Ambulatory Visit: Payer: Medicare HMO | Admitting: Family Medicine

## 2021-03-03 VITALS — BP 119/71 | HR 69 | Temp 98.1°F | Ht 68.0 in | Wt 260.0 lb

## 2021-03-03 DIAGNOSIS — G4733 Obstructive sleep apnea (adult) (pediatric): Secondary | ICD-10-CM

## 2021-03-03 DIAGNOSIS — H409 Unspecified glaucoma: Secondary | ICD-10-CM | POA: Diagnosis not present

## 2021-03-03 DIAGNOSIS — E782 Mixed hyperlipidemia: Secondary | ICD-10-CM

## 2021-03-03 DIAGNOSIS — I739 Peripheral vascular disease, unspecified: Secondary | ICD-10-CM

## 2021-03-03 DIAGNOSIS — Z955 Presence of coronary angioplasty implant and graft: Secondary | ICD-10-CM

## 2021-03-03 DIAGNOSIS — G609 Hereditary and idiopathic neuropathy, unspecified: Secondary | ICD-10-CM | POA: Diagnosis not present

## 2021-03-03 DIAGNOSIS — I700 Atherosclerosis of aorta: Secondary | ICD-10-CM | POA: Insufficient documentation

## 2021-03-03 DIAGNOSIS — I10 Essential (primary) hypertension: Secondary | ICD-10-CM | POA: Diagnosis not present

## 2021-03-03 DIAGNOSIS — I2510 Atherosclerotic heart disease of native coronary artery without angina pectoris: Secondary | ICD-10-CM

## 2021-03-03 DIAGNOSIS — Z6839 Body mass index (BMI) 39.0-39.9, adult: Secondary | ICD-10-CM

## 2021-03-03 DIAGNOSIS — M48061 Spinal stenosis, lumbar region without neurogenic claudication: Secondary | ICD-10-CM

## 2021-03-03 DIAGNOSIS — Z8249 Family history of ischemic heart disease and other diseases of the circulatory system: Secondary | ICD-10-CM

## 2021-03-03 DIAGNOSIS — I252 Old myocardial infarction: Secondary | ICD-10-CM

## 2021-03-03 DIAGNOSIS — Z0000 Encounter for general adult medical examination without abnormal findings: Secondary | ICD-10-CM

## 2021-03-03 DIAGNOSIS — D6869 Other thrombophilia: Secondary | ICD-10-CM | POA: Insufficient documentation

## 2021-03-03 DIAGNOSIS — R7309 Other abnormal glucose: Secondary | ICD-10-CM

## 2021-03-03 DIAGNOSIS — E039 Hypothyroidism, unspecified: Secondary | ICD-10-CM | POA: Diagnosis not present

## 2021-03-03 DIAGNOSIS — M1A9XX0 Chronic gout, unspecified, without tophus (tophi): Secondary | ICD-10-CM

## 2021-03-03 DIAGNOSIS — K219 Gastro-esophageal reflux disease without esophagitis: Secondary | ICD-10-CM | POA: Diagnosis not present

## 2021-03-03 LAB — COMPREHENSIVE METABOLIC PANEL
ALT: 36 U/L (ref 0–53)
AST: 23 U/L (ref 0–37)
Albumin: 4.3 g/dL (ref 3.5–5.2)
Alkaline Phosphatase: 49 U/L (ref 39–117)
BUN: 22 mg/dL (ref 6–23)
CO2: 24 mEq/L (ref 19–32)
Calcium: 9 mg/dL (ref 8.4–10.5)
Chloride: 104 mEq/L (ref 96–112)
Creatinine, Ser: 0.83 mg/dL (ref 0.40–1.50)
GFR: 87.1 mL/min (ref >60.00)
Glucose, Bld: 104 mg/dL — ABNORMAL HIGH (ref 70–99)
Potassium: 4.2 mEq/L (ref 3.5–5.1)
Sodium: 140 mEq/L (ref 135–145)
Total Bilirubin: 0.6 mg/dL (ref 0.2–1.2)
Total Protein: 6.4 g/dL (ref 6.0–8.3)

## 2021-03-03 LAB — B12 AND FOLATE PANEL
Folate: >24.4 ng/mL (ref >5.9)
Vitamin B-12: 333 pg/mL (ref 211–911)

## 2021-03-03 LAB — TSH: TSH: 3.84 u[IU]/mL (ref 0.35–4.50)

## 2021-03-03 LAB — HEMOGLOBIN A1C: Hgb A1c MFr Bld: 6.1 % (ref 4.6–6.5)

## 2021-03-03 LAB — T4, FREE: Free T4: 0.86 ng/dL (ref 0.60–1.60)

## 2021-03-03 MED ORDER — CLOPIDOGREL BISULFATE 75 MG PO TABS: 75.0000 mg | ORAL_TABLET | Freq: Every day | ORAL | 3 refills | Status: DC

## 2021-03-03 MED ORDER — ALLOPURINOL 300 MG PO TABS: 300.0000 mg | ORAL_TABLET | Freq: Every day | ORAL | 1 refills | Status: DC

## 2021-03-03 MED ORDER — ATORVASTATIN CALCIUM 80 MG PO TABS: 80.0000 mg | ORAL_TABLET | Freq: Every day | ORAL | 3 refills | Status: DC

## 2021-03-03 MED ORDER — PANTOPRAZOLE SODIUM 40 MG PO TBEC: 40.0000 mg | DELAYED_RELEASE_TABLET | Freq: Every day | ORAL | 1 refills | Status: DC

## 2021-03-03 MED ORDER — CANDESARTAN CILEXETIL-HCTZ 16-12.5 MG PO TABS: 1.0000 | ORAL_TABLET | Freq: Every day | ORAL | 1 refills | Status: DC

## 2021-03-03 MED ORDER — METOPROLOL SUCCINATE ER 25 MG PO TB24: 25.0000 mg | ORAL_TABLET | Freq: Every day | ORAL | 1 refills | Status: DC

## 2021-03-03 MED ORDER — DULOXETINE HCL 30 MG PO CPEP: 90.0000 mg | ORAL_CAPSULE | Freq: Every day | ORAL | 1 refills | Status: DC

## 2021-03-03 MED ORDER — LEVOTHYROXINE SODIUM 150 MCG PO TABS: 150.0000 ug | ORAL_TABLET | Freq: Every day | ORAL | 3 refills | Status: DC

## 2021-03-03 NOTE — Patient Instructions (Addendum)
Great to see you today.  I have refilled the medication(s) we provide.   If labs were collected, we will inform you of lab results once received either by echart message or telephone call.   - echart message- for normal results that have been seen by the patient already.   - telephone call: abnormal results or if patient has not viewed results in their echart.  Please help [place] help you:  We are honored you have chosen [facility] for your Primary Care home. Below you will find basic instructions that you may need to access in the future. Please help [place] help you by reading the instructions, which cover many of the frequent questions we experience.   Prescription refills and request:  -In order to allow more efficient response time, please call your pharmacy for all refills. They will forward the request electronically to [place]. This allows for the quickest possible response. Request left on a nurse line can take longer to refill, since these are checked as time allows between office patients and other phone calls.  - refill request can take up to 3-5 working days to complete.  - If request is sent electronically and request is appropiate, it is usually completed in 1-2 business days.  - all patients will need to be seen routinely for all chronic medical conditions requiring prescription medications (see follow-up below). If you are overdue for follow up on your condition, you will be asked to make an appointment and we will call in enough medication to cover you until your appointment (up to 30 days).  - all controlled substances will require a face to face visit to request/refill.  - if you desire your prescriptions to go through a new pharmacy, and have an active script at original pharmacy, you will need to call your pharmacy and have scripts transferred to new pharmacy. This is completed between the pharmacy locations and not by your provider.    Results: Our office handles many outgoing and  incoming calls daily. If we have not contacted you within 1 week about your results, please check your mychart to see if there is a message first and if not, then contact our office.  In helping with this matter, you help decrease call volume, and therefore allow [place] to be able to respond to patients needs more efficiently.  We will always attempt to call you with results,  normal or abnormal. However, if we are unable to reach you we will send a message in your my chart with results.   Acute office visits (sick visit):  An acute visit is intended for a new problem and are scheduled in shorter time slots to allow schedule openings for patients with new problems. This is the appropriate visit to discuss a new problem. Problems will not be addressed by phone call or Echart message. Appointment is needed if requesting treatment. In order to provide you with excellent quality medical care with proper time for you to explain your problem, have an exam and receive treatment with instructions, these appointments should be limited to one new problem per visit. If you experience a new problem, in which you desire to be addressed, please make an acute office visit, we save openings on the schedule to accommodate you. Please do not save your new problem for any other type of visit, let us take care of it properly and quickly for you.   Follow up visits:  Depending on your condition(s) your provider will need  to see you routinely in order to provide you with quality care and prescribe medication(s). Most chronic conditions (Example: hypertension, Diabetes, depression/anxiety... etc), require visits a couple times a year. Your provider will instruct you on proper follow up for your personal medical conditions and history. Please make certain to make follow up appointments for your condition as instructed. Failing to do so could result in lapse in your medication treatment/refills. If you request a refill, and are overdue  to be seen on a condition, we will always provide you with a 30 day script (once) to allow you time to schedule.    Medicare wellness (well visit): - we have a wonderful Nurse Maudie Mercury), that will meet with you and provide you will yearly medicare wellness visits. These visits should occur yearly (can not be scheduled less than 1 calendar year apart) and cover preventive health, immunizations, advance directives and screenings you are entitled to yearly through your medicare benefits. Do not miss out on your entitled benefits, this is when medicare will pay for these benefits to be ordered for you.  These are strongly encouraged by your provider and is the appropriate type of visit to make certain you are up to date with all preventive health benefits. If you have not had your medicare wellness exam in the last 12 months, please make certain to schedule one by calling the office and schedule your medicare wellness with Maudie Mercury as soon as possible.   Yearly physical (well visit):  - Adults are recommended to be seen yearly for physicals. Check with your insurance and date of your last physical, most insurances require one calendar year between physicals. Physicals include all preventive health topics, screenings, medical exam and labs that are appropriate for gender/age and history. You may have fasting labs needed at this visit. This is a well visit (not a sick visit), new problems should not be covered during this visit (see acute visit).  - Pediatric patients are seen more frequently when they are younger. Your provider will advise you on well child visit timing that is appropriate for your their age. - This is not a medicare wellness visit. Medicare wellness exams do not have an exam portion to the visit. Some medicare companies allow for a physical, some do not allow a yearly physical. If your medicare allows a yearly physical you can schedule the medicare wellness with our nurse Maudie Mercury and have your physical with  your provider after, on the same day. Please check with insurance for your full benefits.   Late Policy/No Shows:  - all new patients should arrive 15-30 minutes earlier than appointment to allow [place] time  to  obtain all personal demographics,  insurance information and for you to complete office paperwork. - All established patients should arrive 10-15 minutes earlier than appointment time to update all information and be checked in .  - In our best efforts to run on time, if you are late for your appointment you will be asked to either reschedule or if able, we will work you back into the schedule. There will be a wait time to work you back in the schedule,  depending on availability.  - If you are unable to make it to your appointment as scheduled, please call 24 hours ahead of time to allow [place] to fill the time slot with someone else who needs to be seen. If you do not cancel your appointment ahead of time, you may be charged a no show fee.

## 2021-03-03 NOTE — Progress Notes (Signed)
Patient ID: Juan Stanton, male  DOB: 01-21-1946, 75 y.o.   MRN: [id number] Patient Care Team    Relationship Specialty Notifications Start End  Ma Hillock, DO PCP - General Family Medicine  03/03/21   Sharyne Peach, MD Consulting Physician Ophthalmology  03/02/21   Alfonso Patten, MD Referring Physician Cardiology  03/02/21   Ulla Gallo, MD Consulting Physician Dermatology  03/02/21   Criselda Peaches, DPM Consulting Physician Podiatry  03/02/21   Elmarie Mainland, MD Consulting Physician Sleep Medicine  03/02/21     Chief Complaint  Patient presents with   Establish Care    [facility]   Peripheral Neuropathy   Obesity    Subjective:  Juan Stanton is a 75 y.o.  male present for new patient establishment. All past medical history, surgical history, allergies, family history, immunizations, medications and social history were updated in the electronic medical record today. All recent labs, ED visits and hospitalizations within the last year were reviewed.  Gastroesophageal reflux disease, unspecified whether esophagitis present Patient reports condition is stable as long as he remains on Protonix 40 mg daily.  Acquired hypothyroidism Patient reports compliance with 150 mg of levothyroxine daily.  He did have a recent dose change prior to moving to the area, without lab follow-up.  Obstructive sleep apnea (adult) (pediatric) Managed by pulmonology.  Spinal stenosis of lumbar region without neurogenic claudication/Peripheral neuropathy, hereditary/idiopathic She reports he has neuropathy of both his distal lower extremities and distal upper extremities.  He states he has been worked up by neurology and there is an unknown cause of his neuropathy.  He has been prescribed Cymbalta 90 mg daily and Lyrica 100 mg twice daily.  He states these are both very helpful, however he believes his neuropathy has been worsening over the last few months.  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 Blood pressures ranges at home within normal limits. Patient denies chest pain, shortness of breath or lower extremity edema. Pt is  prescribed statin.  Patient is on chronic anticoagulation with aspirin/Plavix  Elevated hemoglobin A1c/Obesity Patient reports he has had elevated A1c's and a random glucose greater than 200 per EMR within the last year.  They have not started medicines for diabetes as of yet and have encouraged diet and exercise.  He reports he just does not seem to be able to take any weight off despite exercise activity.  He reports he could be better at his diet and he is working on that aspect of his health.   Depression screen PHQ 2/9 03/03/2021  Decreased Interest 1  Down, Depressed, Hopeless 0  PHQ - 2 Score 1  Altered sleeping 0  Tired, decreased energy 0  Change in appetite 2  Feeling bad or failure about yourself  0  Trouble concentrating 0  Moving slowly or fidgety/restless 0  Suicidal thoughts 0  PHQ-9 Score 3   GAD 7 : Generalized Anxiety Score 03/03/2021  Nervous, Anxious, on Edge 0  Control/stop worrying 0  Worry too much - different things 0  Trouble relaxing 0  Restless 0  Easily annoyed or irritable 0  Afraid - awful might happen 0  Total GAD 7 Score 0  Fall Risk  03/03/2021  Falls in the past year? 0  Number falls in past yr: 0  Injury with Fall? 0  Follow up Falls evaluation completed   Immunization History  Administered Date(s) Administered   Influenza Split 08/09/2011, 11/02/2012,  06/28/2013, 07/16/2013   Influenza, High Dose Seasonal PF 06/18/2016, 08/14/2018   Influenza, Seasonal, Injecte, Preservative Fre 09/30/2015, 08/08/2017, 06/13/2019   PFIZER(Purple Top)SARS-COV-2 Vaccination 11/23/2019, 12/14/2019, 07/15/2020   Pneumococcal Conjugate-13 09/30/2015   Pneumococcal Polysaccharide-23 07/16/2013   Tdap 10/16/2012   Zoster Recombinat (Shingrix) 06/13/2019, 08/14/2019    No results found.  Past Medical History:  Diagnosis Date   Allergies    Coronary artery disease    Depression 03/29/2020   Formatting of this note might be different from the original. Buproprion XL 150 mg qd   Diverticulitis    sigmoid   GERD (gastroesophageal reflux disease)    Glaucoma    Gout    Heart disease    Hypercholesteremia    Hypertension    Hypothyroid    Kidney stones 09/20/2016   x2, 1888 and 2070   Myocardial infarction [facility])    Neuropathy    TIA (transient ischemic attack)    Torn meniscus 01/29/2006   Left knee.   Allergies  Allergen Reactions   Celebrex [Celecoxib] Swelling   Iodine Nausea Only   Past Surgical History:  Procedure Laterality Date   CARDIAC CATHETERIZATION  02/16/2012   [facility]   CARPAL TUNNEL RELEASE Bilateral    Left-01/15/2016.  Right-03/11/2016.   COLONOSCOPY W/ POLYPECTOMY  12/20/2016   4 mm sessile tubular adenoma w/multifoci high grade dysplasia @65  cm   CORONARY ANGIOPLASTY WITH STENT PLACEMENT  09/16/2010   MI-cardiac catheterization-1 stent-OM.  [facility]   KNEE ARTHROSCOPY Left 01/29/2006   Torn meniscus   LESION EXCISION Left 04/01/2009   Benign.  [facility]   MOHS SURGERY     Family History  Problem Relation Age of Onset   Depression Mother    Diabetes Mother    Hyperlipidemia Mother    Hypertension Mother    Hypertension Father    Depression Sister    Hyperlipidemia Sister    Hypertension Sister    Alcohol abuse Brother    Diabetes Brother    Heart attack  Brother    Heart disease Brother    Hyperlipidemia Brother    Hypertension Brother    Breast cancer Maternal Grandmother    Kidney disease Maternal Grandfather    Stroke Paternal Grandmother    Stroke Paternal Grandfather    Heart disease Brother    Hyperlipidemia Brother    Hypertension Brother    Social History   Social History Narrative   Marital status/children/pets: Married.   Education/employment: Masters degree.  Retired from [employer].   Safety:      -smoke alarm in the home:Yes     - wears seatbelt: Yes       Allergies as of 03/03/2021       Reactions   Celebrex [celecoxib] Swelling   Iodine Nausea Only        Medication List        Accurate as of March 03, 2021 11:59 PM. If you have any questions, ask your nurse or doctor.          STOP taking these medications    metoprolol tartrate 25 MG tablet Commonly known as: LOPRESSOR Stopped by: Howard Pouch, DO  TAKE these medications    acetaminophen 650 MG CR tablet Commonly known as: TYLENOL Take by mouth.   allopurinol 300 MG tablet Commonly known as: ZYLOPRIM Take 1 tablet (300 mg total) by mouth daily.   Alpha-Lipoic Acid 300 MG Caps Take by mouth 2 (two) times daily.   aspirin 81 MG chewable tablet Chew by mouth daily.   atorvastatin 80 MG tablet Commonly known as: LIPITOR Take 1 tablet (80 mg total) by mouth daily.   bimatoprost 0.03 % ophthalmic solution Commonly known as: LUMIGAN 1 drop at bedtime. What changed: Another medication with the same name was removed. Continue taking this medication, and follow the directions you see here. Changed by: Howard Pouch, DO   brimonidine-timolol 0.2-0.5 % ophthalmic solution Commonly known as: COMBIGAN Place 1 drop into both eyes every 12 (twelve) hours.   brinzolamide 1 % ophthalmic suspension Commonly known as: AZOPT Place 1 drop into the right eye 2 (two) times daily.   candesartan-hydrochlorothiazide 16-12.5  MG tablet Commonly known as: ATACAND HCT Take 1 tablet by mouth daily.   clopidogrel 75 MG tablet Commonly known as: PLAVIX Take 1 tablet (75 mg total) by mouth daily.   Co Q-10 100 MG Caps   DSS 100 MG Caps Take by mouth.   DULoxetine 30 MG capsule Commonly known as: CYMBALTA Take 3 capsules (90 mg total) by mouth daily.   fluticasone 50 MCG/ACT nasal spray Commonly known as: FLONASE Place into both nostrils.   levothyroxine 150 MCG tablet Commonly known as: SYNTHROID Take 1 tablet (150 mcg total) by mouth daily. Started by: Howard Pouch, DO   loratadine 10 MG tablet Commonly known as: CLARITIN   metoprolol succinate 25 MG 24 hr tablet Commonly known as: TOPROL-XL Take 1 tablet (25 mg total) by mouth daily. Started by: Howard Pouch, DO   multivitamin capsule Take 1 capsule by mouth daily.   nitroGLYCERIN 0.4 MG SL tablet Commonly known as: NITROSTAT Place under the tongue.   pantoprazole 40 MG tablet Commonly known as: PROTONIX Take 1 tablet (40 mg total) by mouth daily.   polyethylene glycol powder 17 GM/SCOOP powder Commonly known as: GLYCOLAX/MIRALAX Take by mouth.   pregabalin 150 MG capsule Commonly known as: LYRICA Take 1 capsule (150 mg total) by mouth 2 (two) times daily. What changed:  medication strength See the new instructions. Changed by: Howard Pouch, DO        All past medical history, surgical history, allergies, family history, immunizations andmedications were updated in the EMR today and reviewed under the history and medication portions of their EMR.    Recent Results (from the past 2160 hour(s))  B12 and Folate Panel     Status: None   Collection Time: 03/03/21  9:47 AM  Result Value Ref Range   Vitamin B-12 333 211 - 911 pg/mL   Folate >24.4 >5.9 ng/mL  TSH     Status: None   Collection Time: 03/03/21  9:47 AM  Result Value Ref Range   TSH 3.84 0.35 - 4.50 uIU/mL  T4, free     Status: None   Collection Time: 03/03/21   9:47 AM  Result Value Ref Range   Free T4 0.86 0.60 - 1.60 ng/dL    Comment: Specimens from patients who are undergoing biotin therapy and /or ingesting biotin supplements may contain high levels of biotin.  The higher biotin concentration in these specimens interferes with this Free T4 assay.  Specimens that contain high levels  of biotin may  cause false high results for this Free T4 assay.  Please interpret results in light of the total clinical presentation of the patient.    Comp Met (CMET)     Status: Abnormal   Collection Time: 03/03/21  9:47 AM  Result Value Ref Range   Sodium 140 135 - 145 mEq/L   Potassium 4.2 3.5 - 5.1 mEq/L   Chloride 104 96 - 112 mEq/L   CO2 24 19 - 32 mEq/L   Glucose, Bld 104 (H) 70 - 99 mg/dL   BUN 22 6 - 23 mg/dL   Creatinine, Ser 0.83 0.40 - 1.50 mg/dL   Total Bilirubin 0.6 0.2 - 1.2 mg/dL   Alkaline Phosphatase 49 39 - 117 U/L   AST 23 0 - 37 U/L   ALT 36 0 - 53 U/L   Total Protein 6.4 6.0 - 8.3 g/dL   Albumin 4.3 3.5 - 5.2 g/dL   GFR 87.10 >60.00 mL/min    Comment: Calculated using the CKD-EPI Creatinine Equation (2021)   Calcium 9.0 8.4 - 10.5 mg/dL  Hemoglobin A1c     Status: None   Collection Time: 03/03/21  9:47 AM  Result Value Ref Range   Hgb A1c MFr Bld 6.1 4.6 - 6.5 %    Comment: Glycemic Control Guidelines for People with Diabetes:Non Diabetic:  <6%Goal of Therapy: <7%Additional Action Suggested:  >8%     No results found.   ROS: 14 pt review of systems performed and negative (unless mentioned in an HPI)  Objective: BP 119/71   Pulse 69   Temp 98.1 F (36.7 C) (Oral)   Ht 5' 8"  (1.727 m)   Wt 260 lb (117.9 kg)   SpO2 97%   BMI 39.53 kg/m  Gen: Afebrile. No acute distress. Nontoxic in appearance, well-developed, well-nourished, very pleasant, obese male. HENT: AT. [place].  No cough on exam, no hoarseness on exam. Eyes:Pupils Equal Round Reactive to light, Extraocular movements intact,  Conjunctiva without redness, discharge or  icterus. Neck/lymp/endocrine: Supple, no lymphadenopathy, no thyromegaly CV: RRR no murmur, no edema, +2/4 P posterior tibialis pulses.  Chest: CTAB, no wheeze, rhonchi or crackles.  Normal respiratory effort.  Good air movement. Abd: Soft.  Obese. NTND. BS present.  Skin: No rashes, purpura or petechiae. Warm and well-perfused. Skin intact. Neuro/Msk:  Normal gait. PERLA. EOMi. Alert. Oriented x3.   Psych: Normal affect, dress and demeanor. Normal speech. Normal thought content and judgment.   Assessment/plan: Gerlad Pelzel is a 75 y.o. male present for new patient establishment/[facility] Gastroesophageal reflux disease, unspecified whether esophagitis present Stable. Continue Protonix 40 mg daily  Acquired hypothyroidism Continue levothyroxine 150 micrograms daily.  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 Continue daily baby aspirin. Continue Plavix 75 mg daily Continue atorvastatin 80 mg daily. Continue candesartan/HCTZ 16-12.5 mg daily. Continue metoprolol XL 25 mg daily   Elevated hemoglobin A1c/Obesity/diabetes type II in obese If A1c still above goal today, will consider  trial of Ozempic both for glucose management, obesity and provide  cardiovascular protection in a patient with heart disease. - Hemoglobin A1c Very tight control of his sugars are recommended given his cardiac conditions.  Gout: Stable. Continue allopurinol 300 mg daily   Return in about 5 months (around 08/10/2021). Greater than 46 minutes was spent on patient encounter today.  Orders Placed This Encounter  Procedures   B12 and Folate Panel   TSH   T4, free   Comp Met (CMET)   Hemoglobin A1c   Meds ordered this encounter  Medications   allopurinol (ZYLOPRIM) 300 MG tablet    Sig: Take 1 tablet (300 mg total) by mouth daily.    Dispense:  90 tablet    Refill:  1   atorvastatin (LIPITOR) 80 MG tablet    Sig: Take 1 tablet (80 mg total) by mouth daily.    Dispense:  90 tablet    Refill:  3   candesartan-hydrochlorothiazide (ATACAND HCT) 16-12.5 MG tablet    Sig: Take 1 tablet by mouth daily.    Dispense:  90 tablet    Refill:  1   clopidogrel (PLAVIX) 75 MG tablet    Sig: Take 1 tablet (75 mg total) by mouth daily.    Dispense:  90 tablet    Refill:  3   pantoprazole (PROTONIX) 40 MG tablet    Sig: Take 1 tablet (40 mg total) by mouth daily.    Dispense:  90 tablet    Refill:  1   DULoxetine (CYMBALTA) 30 MG capsule    Sig: Take 3 capsules (90 mg total) by mouth daily.    Dispense:  270 capsule    Refill:  1   metoprolol succinate (TOPROL-XL) 25 MG 24 hr tablet    Sig: Take 1 tablet (25 mg total) by mouth daily.    Dispense:  90 tablet    Refill:  1   levothyroxine (SYNTHROID) 150 MCG tablet    Sig: Take 1 tablet (150 mcg total) by mouth daily.    Dispense:  90 tablet    Refill:  3   pregabalin (LYRICA) 150 MG capsule    Sig: Take 1 capsule (150 mg total) by mouth 2 (two) times daily.    Dispense:  180 capsule    Refill:  1   Referral Orders  No referral(s) requested today     Note is dictated utilizing voice recognition software. Although note has been proof read prior to signing, occasional typographical errors still  can be missed. If any questions arise, please do not hesitate to call for verification.  Electronically signed by: Howard Pouch, DO [place]

## 2021-03-04 ENCOUNTER — Telehealth: Payer: Self-pay | Admitting: Family Medicine

## 2021-03-04 DIAGNOSIS — I252 Old myocardial infarction: Secondary | ICD-10-CM

## 2021-03-04 DIAGNOSIS — E1169 Type 2 diabetes mellitus with other specified complication: Secondary | ICD-10-CM | POA: Insufficient documentation

## 2021-03-04 DIAGNOSIS — I2510 Atherosclerotic heart disease of native coronary artery without angina pectoris: Secondary | ICD-10-CM

## 2021-03-04 DIAGNOSIS — Z955 Presence of coronary angioplasty implant and graft: Secondary | ICD-10-CM

## 2021-03-04 MED ORDER — PREGABALIN 150 MG PO CAPS: 150.0000 mg | ORAL_CAPSULE | Freq: Two times a day (BID) | ORAL | 1 refills | Status: DC

## 2021-03-04 MED ORDER — OZEMPIC (0.25 OR 0.5 MG/DOSE) 2 MG/1.5ML ~~LOC~~ SOPN: PEN_INJECTOR | SUBCUTANEOUS | 11 refills | Status: DC

## 2021-03-04 NOTE — Telephone Encounter (Signed)
LVM for pt to CB regarding results.  

## 2021-03-04 NOTE — Telephone Encounter (Signed)
Please call patient Liver, kidney and thyroid function are normal> I have refilled his thyroid supplement at his current dose. Folate levels are normal. B12 is on the lower end of normal at 333.  People can have neurological symptoms such as neuropathy-like sensations and fatigue when levels fall below 400-500.  I would recommend he start over-the-counter sublingual B12 [phone] mcg daily. Although he may find benefit in his neuropathy sensations when he increases his B12 levels, I also increased his Lyrica dose to 150 mg every 12 hours.  He can certainly finish the supply he currently has by taking his Lyrica 100 mg capsules every 8 hours.  Total daily dose Lyrica 300 mg. Diabetes screening/A1c is elevated at 6.1.  Given the elevation in A1c above normal and he has had an elevated glucose in the past above 200.  I am going to attempt to prescribe the Ozempic once weekly injection for him that will help lower his sugars, it is cardioprotective and can help with weight loss.  I did call this medicine into the local pharmacy-[facility] in [place], at least until we can see if it is going to be covered by insurance.  - There is a savings offer from the manufacturer that he can also apply for and may pay as little as $25 for 57-month supply if he qualifies.  He can text the word begin to [phone] to sign up for the savings offer  I have refilled all of his medications to his mail-in pharmacy (except Ozempic).  Recommend following up in approximately 3-4 months so we can reevaluate his A1c on medication and see if he has improvement in his neuropathy on current changes.

## 2021-03-05 NOTE — Telephone Encounter (Signed)
Spoke with pt regarding labs and instructions.   

## 2021-03-06 ENCOUNTER — Encounter: Payer: Self-pay | Admitting: Family Medicine

## 2021-03-16 ENCOUNTER — Ambulatory Visit: Payer: Medicare HMO | Admitting: Podiatry

## 2021-03-19 ENCOUNTER — Ambulatory Visit: Payer: Medicare HMO | Admitting: Podiatry

## 2021-03-19 ENCOUNTER — Other Ambulatory Visit: Payer: Self-pay

## 2021-03-19 DIAGNOSIS — B351 Tinea unguium: Secondary | ICD-10-CM | POA: Diagnosis not present

## 2021-03-19 DIAGNOSIS — G609 Hereditary and idiopathic neuropathy, unspecified: Secondary | ICD-10-CM | POA: Diagnosis not present

## 2021-03-19 DIAGNOSIS — M79674 Pain in right toe(s): Secondary | ICD-10-CM | POA: Diagnosis not present

## 2021-03-19 DIAGNOSIS — L84 Corns and callosities: Secondary | ICD-10-CM | POA: Diagnosis not present

## 2021-03-19 DIAGNOSIS — E669 Obesity, unspecified: Secondary | ICD-10-CM | POA: Diagnosis not present

## 2021-03-19 DIAGNOSIS — M79675 Pain in left toe(s): Secondary | ICD-10-CM | POA: Diagnosis not present

## 2021-03-19 DIAGNOSIS — E1169 Type 2 diabetes mellitus with other specified complication: Secondary | ICD-10-CM | POA: Diagnosis not present

## 2021-03-23 ENCOUNTER — Encounter: Payer: Self-pay | Admitting: Podiatry

## 2021-03-23 NOTE — Progress Notes (Signed)
  Subjective:  Patient ID: Juan Stanton, male    DOB: 1946-03-20,  MRN: [id number]  Chief Complaint  Patient presents with   Nail Problem    Routine foot care    75 y.o. male returns with the above complaint. History confirmed with patient.  Doing well, no new issues Objective:  Physical Exam: warm, good capillary refill, no trophic changes or ulcerative lesions and normal DP and PT pulses.  Absent light touch sensation and protective sensation distally.  He has nail dystrophy and residual onychomycosis of all nails.  Callus present bilateral hallux  Assessment:   1. Pain due to onychomycosis of toenails of both feet   2. Callus of foot   3. Diabetes mellitus type 2 in obese ([place])   4. Idiopathic neuropathy      Plan:  Patient was evaluated and treated and all questions answered.  Discussed the etiology and treatment options for the condition in detail with the patient. Educated patient on the topical and oral treatment options for mycotic nails. Recommended debridement of the nails today. Sharp and mechanical debridement performed of all painful and mycotic nails today. Nails debrided in length and thickness using a nail nipper. Discussed treatment options including appropriate shoe gear. Follow up as needed for painful nails.  All symptomatic hyperkeratoses were safely debrided with a sterile #15 blade to patient's level of comfort without incident. We discussed preventative and palliative care of these lesions including supportive and accommodative shoegear, padding, prefabricated and custom molded accommodative orthoses, use of a pumice stone and lotions/creams daily.    Return in about 3 months (around 06/19/2021) for at risk diabetic foot care.

## 2021-06-03 ENCOUNTER — Ambulatory Visit (INDEPENDENT_AMBULATORY_CARE_PROVIDER_SITE_OTHER): Payer: Medicare HMO | Admitting: *Deleted

## 2021-06-03 DIAGNOSIS — Z0000 Encounter for general adult medical examination without abnormal findings: Secondary | ICD-10-CM

## 2021-06-03 NOTE — Patient Instructions (Signed)
Juan Stanton , Thank you for taking time to come for your Medicare Wellness Visit. I appreciate your ongoing commitment to your health goals. Please review the following plan we discussed and let me know if I can assist you in the future.   Screening recommendations/referrals: Colonoscopy: up to date Recommended yearly ophthalmology/optometry visit for glaucoma screening and checkup Recommended yearly dental visit for hygiene and checkup  Vaccinations: Influenza vaccine: up to date Pneumococcal vaccine: up to date Tdap vaccine: up to date Shingles vaccine:  up to date   Advanced directives: Education provided  Conditions/risks identified:   Next appointment: 07-01-2021 @ 9:00 am  Dr. Raoul Pitch  Preventive Care 30 Years and Older, Male Preventive care refers to lifestyle choices and visits with your health care provider that can promote health and wellness. What does preventive care include? A yearly physical exam. This is also called an annual well check. Dental exams once or twice a year. Routine eye exams. Ask your health care provider how often you should have your eyes checked. Personal lifestyle choices, including: Daily care of your teeth and gums. Regular physical activity. Eating a healthy diet. Avoiding tobacco and drug use. Limiting alcohol use. Practicing safe sex. Taking low doses of aspirin every day. Taking vitamin and mineral supplements as recommended by your health care provider. What happens during an annual well check? The services and screenings done by your health care provider during your annual well check will depend on your age, overall health, lifestyle risk factors, and family history of disease. Counseling  Your health care provider may ask you questions about your: Alcohol use. Tobacco use. Drug use. Emotional well-being. Home and relationship well-being. Sexual activity. Eating habits. History of falls. Memory and ability to understand  (cognition). Work and work Statistician. Screening  You may have the following tests or measurements: Height, weight, and BMI. Blood pressure. Lipid and cholesterol levels. These may be checked every 5 years, or more frequently if you are over 36 years old. Skin check. Lung cancer screening. You may have this screening every year starting at age 63 if you have a 30-pack-year history of smoking and currently smoke or have quit within the past 15 years. Fecal occult blood test (FOBT) of the stool. You may have this test every year starting at age 5. Flexible sigmoidoscopy or colonoscopy. You may have a sigmoidoscopy every 5 years or a colonoscopy every 10 years starting at age 43. Prostate cancer screening. Recommendations will vary depending on your family history and other risks. Hepatitis C blood test. Hepatitis B blood test. Sexually transmitted disease (STD) testing. Diabetes screening. This is done by checking your blood sugar (glucose) after you have not eaten for a while (fasting). You may have this done every 1-3 years. Abdominal aortic aneurysm (AAA) screening. You may need this if you are a current or former smoker. Osteoporosis. You may be screened starting at age 21 if you are at high risk. Talk with your health care provider about your test results, treatment options, and if necessary, the need for more tests. Vaccines  Your health care provider may recommend certain vaccines, such as: Influenza vaccine. This is recommended every year. Tetanus, diphtheria, and acellular pertussis (Tdap, Td) vaccine. You may need a Td booster every 10 years. Zoster vaccine. You may need this after age 24. Pneumococcal 13-valent conjugate (PCV13) vaccine. One dose is recommended after age 8. Pneumococcal polysaccharide (PPSV23) vaccine. One dose is recommended after age 71. Talk to your health care provider about  which screenings and vaccines you need and how often you need them. This  information is not intended to replace advice given to you by your health care provider. Make sure you discuss any questions you have with your health care provider. Document Released: 10/10/2015 Document Revised: 06/02/2016 Document Reviewed: 07/15/2015 Elsevier Interactive Patient Education  2017 [employer] Prevention in the Home Falls can cause injuries. They can happen to people of all ages. There are many things you can do to make your home safe and to help prevent falls. What can I do on the outside of my home? Regularly fix the edges of walkways and driveways and fix any cracks. Remove anything that might make you trip as you walk through a door, such as a raised step or threshold. Trim any bushes or trees on the path to your home. Use bright outdoor lighting. Clear any walking paths of anything that might make someone trip, such as rocks or tools. Regularly check to see if handrails are loose or broken. Make sure that both sides of any steps have handrails. Any raised decks and porches should have guardrails on the edges. Have any leaves, snow, or ice cleared regularly. Use sand or salt on walking paths during winter. Clean up any spills in your garage right away. This includes oil or grease spills. What can I do in the bathroom? Use night lights. Install grab bars by the toilet and in the tub and shower. Do not use towel bars as grab bars. Use non-skid mats or decals in the tub or shower. If you need to sit down in the shower, use a plastic, non-slip stool. Keep the floor dry. Clean up any water that spills on the floor as soon as it happens. Remove soap buildup in the tub or shower regularly. Attach bath mats securely with double-sided non-slip rug tape. Do not have throw rugs and other things on the floor that can make you trip. What can I do in the bedroom? Use night lights. Make sure that you have a light by your bed that is easy to reach. Do not use any sheets or  blankets that are too big for your bed. They should not hang down onto the floor. Have a firm chair that has side arms. You can use this for support while you get dressed. Do not have throw rugs and other things on the floor that can make you trip. What can I do in the kitchen? Clean up any spills right away. Avoid walking on wet floors. Keep items that you use a lot in easy-to-reach places. If you need to reach something above you, use a strong step stool that has a grab bar. Keep electrical cords out of the way. Do not use floor polish or wax that makes floors slippery. If you must use wax, use non-skid floor wax. Do not have throw rugs and other things on the floor that can make you trip. What can I do with my stairs? Do not leave any items on the stairs. Make sure that there are handrails on both sides of the stairs and use them. Fix handrails that are broken or loose. Make sure that handrails are as long as the stairways. Check any carpeting to make sure that it is firmly attached to the stairs. Fix any carpet that is loose or worn. Avoid having throw rugs at the top or bottom of the stairs. If you do have throw rugs, attach them to the floor with  carpet tape. Make sure that you have a light switch at the top of the stairs and the bottom of the stairs. If you do not have them, ask someone to add them for you. What else can I do to help prevent falls? Wear shoes that: Do not have high heels. Have rubber bottoms. Are comfortable and fit you well. Are closed at the toe. Do not wear sandals. If you use a stepladder: Make sure that it is fully opened. Do not climb a closed stepladder. Make sure that both sides of the stepladder are locked into place. Ask someone to hold it for you, if possible. Clearly mark and make sure that you can see: Any grab bars or handrails. First and last steps. Where the edge of each step is. Use tools that help you move around (mobility aids) if they are  needed. These include: Canes. Walkers. Scooters. Crutches. Turn on the lights when you go into a dark area. Replace any light bulbs as soon as they burn out. Set up your furniture so you have a clear path. Avoid moving your furniture around. If any of your floors are uneven, fix them. If there are any pets around you, be aware of where they are. Review your medicines with your doctor. Some medicines can make you feel dizzy. This can increase your chance of falling. Ask your doctor what other things that you can do to help prevent falls. This information is not intended to replace advice given to you by your health care provider. Make sure you discuss any questions you have with your health care provider. Document Released: 07/10/2009 Document Revised: 02/19/2016 Document Reviewed: 10/18/2014 Elsevier Interactive Patient Education  2017 [employer].

## 2021-06-03 NOTE — Progress Notes (Signed)
Subjective:   Juan Stanton is a 75 y.o. male who presents for Medicare Annual/Subsequent preventive examination.  I connected with  Juan Stanton on 06/03/21 by a telephone enabled telemedicine application and verified that I am speaking with the correct person using two identifiers.   I discussed the limitations of evaluation and management by telemedicine. The patient expressed understanding and agreed to proceed.   Review of Systems     Cardiac Risk Factors include: advanced age (>56mn, >>79women);hypertension;male gender;obesity (BMI >30kg/m2)     Objective:    Today's Vitals   There is no height or weight on file to calculate BMI.  Advanced Directives 06/03/2021 09/22/2016 09/21/2016  Does Patient Have a Medical Advance Directive? No Yes Yes  Type of Advance Directive - - Living will  Would patient like information on creating a medical advance directive? No - Patient declined - -    Current Medications (verified) Outpatient Encounter Medications as of 06/03/2021  Medication Sig   acetaminophen (TYLENOL) 650 MG CR tablet Take by mouth.   allopurinol (ZYLOPRIM) 300 MG tablet Take 1 tablet (300 mg total) by mouth daily.   Alpha-Lipoic Acid 300 MG CAPS Take by mouth 2 (two) times daily.   aspirin 81 MG chewable tablet Chew by mouth daily.   atorvastatin (LIPITOR) 80 MG tablet Take 1 tablet (80 mg total) by mouth daily.   bimatoprost (LUMIGAN) 0.03 % ophthalmic solution 1 drop at bedtime.   brimonidine-timolol (COMBIGAN) 0.2-0.5 % ophthalmic solution Place 1 drop into both eyes every 12 (twelve) hours.   brinzolamide (AZOPT) 1 % ophthalmic suspension Place 1 drop into the right eye 2 (two) times daily.   candesartan-hydrochlorothiazide (ATACAND HCT) 16-12.5 MG tablet Take 1 tablet by mouth daily.   clopidogrel (PLAVIX) 75 MG tablet Take 1 tablet (75 mg total) by mouth daily.   Coenzyme Q10 (CO Q-10) 100 MG CAPS    DULoxetine (CYMBALTA) 30 MG capsule Take 3 capsules (90 mg  total) by mouth daily.   fluticasone (FLONASE) 50 MCG/ACT nasal spray Place into both nostrils.   levothyroxine (SYNTHROID) 150 MCG tablet Take 1 tablet (150 mcg total) by mouth daily.   metoprolol succinate (TOPROL-XL) 25 MG 24 hr tablet Take 1 tablet (25 mg total) by mouth daily.   Multiple Vitamin (MULTIVITAMIN) capsule Take 1 capsule by mouth daily.   nitroGLYCERIN (NITROSTAT) 0.4 MG SL tablet Place under the tongue.   pantoprazole (PROTONIX) 40 MG tablet Take 1 tablet (40 mg total) by mouth daily.   polyethylene glycol powder (GLYCOLAX/MIRALAX) 17 GM/SCOOP powder Take by mouth.   pregabalin (LYRICA) 150 MG capsule Take 1 capsule (150 mg total) by mouth 2 (two) times daily.   Semaglutide,0.25 or 0.'5MG'$ /DOS, (OZEMPIC, 0.25 OR 0.5 MG/DOSE,) 2 MG/1.5ML SOPN 0.25 mg weekly injection for 4 weeks.  Then, increase dose to 0.5 mg weekly injection.   Docusate Sodium (DSS) 100 MG CAPS Take by mouth.   loratadine (CLARITIN) 10 MG tablet  (Patient not taking: Reported on 06/03/2021)   No facility-administered encounter medications on file as of 06/03/2021.    Allergies (verified) Celebrex [celecoxib] and Iodine   History: Past Medical History:  Diagnosis Date   Allergies    Coronary artery disease    Depression 03/29/2020   Formatting of this note might be different from the original. Buproprion XL 150 mg qd   Diverticulitis    sigmoid   GERD (gastroesophageal reflux disease)    Glaucoma    Gout    Heart disease  Hypercholesteremia    Hypertension    Hypothyroid    Kidney stones 09/20/2016   x2, 1888 and 2070   Myocardial infarction [facility])    Neuropathy    Sleep apnea    Dr. Reece Levy   TIA (transient ischemic attack)    Torn meniscus 01/29/2006   Left knee.   Past Surgical History:  Procedure Laterality Date   CARDIAC CATHETERIZATION  02/16/2012   [facility]   CARPAL TUNNEL RELEASE Bilateral    Left-01/15/2016.  Right-03/11/2016.   COLONOSCOPY W/ POLYPECTOMY   12/20/2016   4 mm sessile tubular adenoma w/multifoci high grade dysplasia '@65'$  cm   CORONARY ANGIOPLASTY WITH STENT PLACEMENT  09/16/2010   MI-cardiac catheterization-1 stent-OM.  [facility]   KNEE ARTHROSCOPY Left 01/29/2006   Torn meniscus   LESION EXCISION Left 04/01/2009   Benign.  [facility]   [facility]     Family History  Problem Relation Age of Onset   Depression Mother    Diabetes Mother    Hyperlipidemia Mother    Hypertension Mother    Hypertension Father    Depression Sister    Hyperlipidemia Sister    Hypertension Sister    Alcohol abuse Brother    Diabetes Brother    Heart attack Brother    Heart disease Brother    Hyperlipidemia Brother    Hypertension Brother    Breast cancer Maternal Grandmother    Kidney disease Maternal Grandfather    Stroke Paternal Grandmother    Stroke Paternal Grandfather    Heart disease Brother    Hyperlipidemia Brother    Hypertension Brother    Social History   Socioeconomic History   Marital status: Married    Spouse name: Not on file   Number of children: Not on file   Years of education: Not on file   Highest education level: Not on file  Occupational History   Not on file  Tobacco Use   Smoking status: Former    Types: Cigarettes    Quit date: 09/21/1986    Years since quitting: 34.7   Smokeless tobacco: Never  Vaping Use   Vaping Use: Never used  Substance and Sexual Activity   Alcohol use: Yes    Alcohol/week: 2.0 standard drinks    Types: 2 Cans of beer per week   Drug use: No   Sexual activity: Not Currently  Other Topics Concern   Not on file  Social History Narrative   Marital status/children/pets: Married.   Education/employment: Masters degree.  Retired from [employer]   Safety:      -smoke alarm in the home:Yes     - wears seatbelt: Yes      Social Determinants of Health   Financial Resource Strain: Low Risk    Difficulty of Paying  Living Expenses: Not hard at all  Food Insecurity: No Food Insecurity   Worried About Charity fundraiser in the Last Year: Never true   Ran Out of Food in the Last Year: Never true  Transportation Needs: No Transportation Needs   Lack of Transportation (Medical): No   Lack of Transportation (Non-Medical): No  Physical Activity: Sufficiently Active   Days of Exercise per Week: 4 days   Minutes of Exercise per Session: 40 min  Stress: No Stress Concern Present   Feeling of Stress : Only a little  Social Connections: Moderately Isolated   Frequency of Communication with Friends and Family: Three times a  week   Frequency of Social Gatherings with Friends and Family: More than three times a week   Attends Religious Services: Never   Marine scientist or Organizations: No   Attends Music therapist: Never   Marital Status: Married    Tobacco Counseling Counseling given: Not Answered   Clinical Intake:  Pre-visit preparation completed: Yes  Pain : No/denies pain     Nutritional Risks: None Diabetes: No  How often do you need to have someone help you when you read instructions, pamphlets, or other written materials from your doctor or pharmacy?: 1 - Never  Diabetic? Pre diabetic   Interpreter Needed?: No  Information entered by :: Leroy Kennedy LPN   Activities of Daily Living In your present state of health, do you have any difficulty performing the following activities: 06/03/2021  Hearing? Y  Vision? N  Difficulty concentrating or making decisions? N  Walking or climbing stairs? N  Dressing or bathing? N  Doing errands, shopping? N  Preparing Food and eating ? N  Using the Toilet? N  In the past six months, have you accidently leaked urine? N  Do you have problems with loss of bowel control? N  Managing your Medications? N  Managing your Finances? N  Housekeeping or managing your Housekeeping? N  Some recent data might be hidden    Patient  Care Team: Ma Hillock, DO as PCP - General (Family Medicine) Sharyne Peach, MD as Consulting Physician (Ophthalmology) Atilano Median, Gayleen Orem, MD as Referring Physician (Cardiology) Ulla Gallo, MD as Consulting Physician (Dermatology) Criselda Peaches, DPM as Consulting Physician (Podiatry) Elmarie Mainland, MD as Consulting Physician (Sleep Medicine)  Indicate any recent Medical Services you may have received from other than [facility] providers in the past year (date may be approximate).     Assessment:   This is a routine wellness examination for Juan Stanton.  Hearing/Vision screen Hearing Screening - Comments:: Patient has tinnitus No hearing aids  Vision Screening - Comments:: Dr. Delman Cheadle Up to date  Dietary issues and exercise activities discussed: Current Exercise Habits: Home exercise routine, Type of exercise: walking, Time (Minutes): 30, Frequency (Times/Week): 4, Weekly Exercise (Minutes/Week): 120, Intensity: Mild   Goals Addressed   None    Depression Screen PHQ 2/9 Scores 06/03/2021 03/03/2021  PHQ - 2 Score 1 1  PHQ- 9 Score - 3    Fall Risk Fall Risk  06/03/2021 03/03/2021  Falls in the past year? 0 0  Number falls in past yr: 0 0  Injury with Fall? 0 0  Follow up Falls evaluation completed;Falls prevention discussed Falls evaluation completed    FALL RISK PREVENTION PERTAINING TO THE HOME:  Any stairs in or around the home? Yes  If so, are there any without handrails? No  Home free of loose throw rugs in walkways, pet beds, electrical cords, etc? Yes  Adequate lighting in your home to reduce risk of falls? Yes   ASSISTIVE DEVICES UTILIZED TO PREVENT FALLS:  Life alert? No  Use of a cane, walker or w/c? No  Grab bars in the bathroom? No  Shower chair or bench in shower? No  Elevated toilet seat or a handicapped toilet? No   TIMED UP AND GO:  Was the test performed? No .    Cognitive Function:  Normal cognitive status assessed by direct observation by  this Nurse Health Advisor. No abnormalities found.          Immunizations Immunization History  Administered Date(s) Administered   Influenza Split 08/09/2011, 11/02/2012, 06/28/2013, 07/16/2013   Influenza, High Dose Seasonal PF 06/18/2016, 08/14/2018   Influenza, Seasonal, Injecte, Preservative Fre 09/30/2015, 08/08/2017, 06/13/2019   PFIZER(Purple Top)SARS-COV-2 Vaccination 11/23/2019, 12/14/2019, 07/15/2020   Pneumococcal Conjugate-13 09/30/2015   Pneumococcal Polysaccharide-23 07/16/2013   Tdap 10/16/2012   Zoster Recombinat (Shingrix) 06/13/2019, 08/14/2019    TDAP status: Up to date  Flu Vaccine status: Up to date  Pneumococcal vaccine status: Up to date  Covid-19 vaccine status: Information provided on how to obtain vaccines.   Qualifies for Shingles Vaccine? No   Zostavax completed No   Shingrix Completed?: Yes  Screening Tests Health Maintenance  Topic Date Due   FOOT EXAM  Never done   OPHTHALMOLOGY EXAM  Never done   Hepatitis C Screening  Never done   COVID-19 Vaccine (4 - Booster for Pfizer series) 11/15/2020   INFLUENZA VACCINE  04/27/2021   HEMOGLOBIN A1C  09/02/2021   COLONOSCOPY (Pts 45-66yr Insurance coverage will need to be confirmed)  12/20/2021   TETANUS/TDAP  10/16/2022   PNA vac Low Risk Adult  Completed   Zoster Vaccines- Shingrix  Completed   HPV VACCINES  Aged Out    Health Maintenance  Health Maintenance Due  Topic Date Due   FOOT EXAM  Never done   OPHTHALMOLOGY EXAM  Never done   Hepatitis C Screening  Never done   COVID-19 Vaccine (4 - Booster for Pfizer series) 11/15/2020   INFLUENZA VACCINE  04/27/2021    Colorectal cancer screening: Type of screening: Colonoscopy. Completed 2018. Repeat every 5 years  Lung Cancer Screening: (Low Dose CT Chest recommended if Age 725-80years, 30 pack-year currently smoking OR have quit w/in 15years.) qualify.   Lung Cancer Screening Referral:   Additional Screening:  Hepatitis C  Screening: does qualify;   Vision Screening: Recommended annual ophthalmology exams for early detection of glaucoma and other disorders of the eye. Is the patient up to date with their annual eye exam?  Yes  Who is the provider or what is the name of the office in which the patient attends annual eye exams? Dr. GDelman CheadleIf pt is not established with a provider, would they like to be referred to a provider to establish care? No .   Dental Screening: Recommended annual dental exams for proper oral hygiene  Community Resource Referral / Chronic Care Management: CRR required this visit?  No   CCM required this visit?  No      Plan:     I have personally reviewed and noted the following in the patient's chart:   Medical and social history Use of alcohol, tobacco or illicit drugs  Current medications and supplements including opioid prescriptions. Patient is not currently taking opioid prescriptions. Functional ability and status Nutritional status Physical activity Advanced directives List of other physicians Hospitalizations, surgeries, and ER visits in previous 12 months Vitals Screenings to include cognitive, depression, and falls Referrals and appointments  In addition, I have reviewed and discussed with patient certain preventive protocols, quality metrics, and best practice recommendations. A written personalized care plan for preventive services as well as general preventive health recommendations were provided to patient.     JLeroy Kennedy LPN   [id number]  Nurse Notes:

## 2021-06-12 ENCOUNTER — Encounter: Payer: Self-pay | Admitting: Family Medicine

## 2021-06-12 ENCOUNTER — Other Ambulatory Visit: Payer: Self-pay

## 2021-06-12 ENCOUNTER — Ambulatory Visit (INDEPENDENT_AMBULATORY_CARE_PROVIDER_SITE_OTHER): Payer: Medicare HMO | Admitting: Family Medicine

## 2021-06-12 VITALS — BP 99/66 | HR 68 | Temp 98.5°F | Ht 68.0 in | Wt 270.0 lb

## 2021-06-12 DIAGNOSIS — E039 Hypothyroidism, unspecified: Secondary | ICD-10-CM

## 2021-06-12 DIAGNOSIS — Z6839 Body mass index (BMI) 39.0-39.9, adult: Secondary | ICD-10-CM

## 2021-06-12 DIAGNOSIS — E1169 Type 2 diabetes mellitus with other specified complication: Secondary | ICD-10-CM

## 2021-06-12 DIAGNOSIS — M48061 Spinal stenosis, lumbar region without neurogenic claudication: Secondary | ICD-10-CM

## 2021-06-12 DIAGNOSIS — E782 Mixed hyperlipidemia: Secondary | ICD-10-CM

## 2021-06-12 DIAGNOSIS — I252 Old myocardial infarction: Secondary | ICD-10-CM

## 2021-06-12 DIAGNOSIS — Z955 Presence of coronary angioplasty implant and graft: Secondary | ICD-10-CM

## 2021-06-12 DIAGNOSIS — Z23 Encounter for immunization: Secondary | ICD-10-CM | POA: Diagnosis not present

## 2021-06-12 DIAGNOSIS — Z8249 Family history of ischemic heart disease and other diseases of the circulatory system: Secondary | ICD-10-CM

## 2021-06-12 DIAGNOSIS — E785 Hyperlipidemia, unspecified: Secondary | ICD-10-CM

## 2021-06-12 DIAGNOSIS — E669 Obesity, unspecified: Secondary | ICD-10-CM | POA: Diagnosis not present

## 2021-06-12 DIAGNOSIS — I2510 Atherosclerotic heart disease of native coronary artery without angina pectoris: Secondary | ICD-10-CM

## 2021-06-12 DIAGNOSIS — E119 Type 2 diabetes mellitus without complications: Secondary | ICD-10-CM

## 2021-06-12 DIAGNOSIS — G609 Hereditary and idiopathic neuropathy, unspecified: Secondary | ICD-10-CM

## 2021-06-12 DIAGNOSIS — D6869 Other thrombophilia: Secondary | ICD-10-CM

## 2021-06-12 DIAGNOSIS — I739 Peripheral vascular disease, unspecified: Secondary | ICD-10-CM

## 2021-06-12 DIAGNOSIS — M1A9XX0 Chronic gout, unspecified, without tophus (tophi): Secondary | ICD-10-CM | POA: Diagnosis not present

## 2021-06-12 DIAGNOSIS — K219 Gastro-esophageal reflux disease without esophagitis: Secondary | ICD-10-CM

## 2021-06-12 DIAGNOSIS — I700 Atherosclerosis of aorta: Secondary | ICD-10-CM

## 2021-06-12 DIAGNOSIS — I10 Essential (primary) hypertension: Secondary | ICD-10-CM

## 2021-06-12 DIAGNOSIS — E538 Deficiency of other specified B group vitamins: Secondary | ICD-10-CM

## 2021-06-12 LAB — POCT GLYCOSYLATED HEMOGLOBIN (HGB A1C)
HbA1c POC (<> result, manual entry): 5.5 % (ref 4.0–5.6)
HbA1c, POC (controlled diabetic range): 5.5 % (ref 0.0–7.0)
HbA1c, POC (prediabetic range): 5.5 % — AB (ref 5.7–6.4)
Hemoglobin A1C: 5.5 % (ref 4.0–5.6)

## 2021-06-12 MED ORDER — CLOPIDOGREL BISULFATE 75 MG PO TABS: 75.0000 mg | ORAL_TABLET | Freq: Every day | ORAL | 3 refills | Status: DC

## 2021-06-12 MED ORDER — PANTOPRAZOLE SODIUM 40 MG PO TBEC: 40.0000 mg | DELAYED_RELEASE_TABLET | Freq: Every day | ORAL | 1 refills | Status: DC

## 2021-06-12 MED ORDER — CANDESARTAN CILEXETIL-HCTZ 16-12.5 MG PO TABS: 1.0000 | ORAL_TABLET | Freq: Every day | ORAL | 1 refills | Status: DC

## 2021-06-12 MED ORDER — OZEMPIC (0.25 OR 0.5 MG/DOSE) 2 MG/1.5ML ~~LOC~~ SOPN: 0.5000 mg | PEN_INJECTOR | Freq: Every day | SUBCUTANEOUS | 2 refills | Status: DC

## 2021-06-12 MED ORDER — ALLOPURINOL 300 MG PO TABS: 300.0000 mg | ORAL_TABLET | Freq: Every day | ORAL | 1 refills | Status: DC

## 2021-06-12 MED ORDER — DULOXETINE HCL 30 MG PO CPEP: 90.0000 mg | ORAL_CAPSULE | Freq: Every day | ORAL | 1 refills | Status: DC

## 2021-06-12 NOTE — Telephone Encounter (Signed)
Pt requesting rx to be sent via mail order.  Med pending, please advise.

## 2021-06-12 NOTE — Patient Instructions (Signed)
Great to see you today.  I have refilled the medication(s) we provide.   If labs were collected, we will inform you of lab results once received either by echart message or telephone call.   - echart message- for normal results that have been seen by the patient already.   - telephone call: abnormal results or if patient has not viewed results in their echart.  

## 2021-06-12 NOTE — Progress Notes (Signed)
Patient ID: Juan Stanton, male  DOB: 1946-08-24, 75 y.o.   MRN: [id number] Patient Care Team    Relationship Specialty Notifications Start End  Ma Hillock, DO PCP - General Family Medicine  03/03/21   Sharyne Peach, MD Consulting Physician Ophthalmology  03/02/21   Alfonso Patten, MD Referring Physician Cardiology  03/02/21   Ulla Gallo, MD Consulting Physician Dermatology  03/02/21   Criselda Peaches, DPM Consulting Physician Podiatry  03/02/21   Elmarie Mainland, MD Consulting Physician Sleep Medicine  03/02/21     Chief Complaint  Patient presents with   Diabetes    [place]; pt is not fasting    Subjective: Juan Stanton is a 75 y.o.  male present for [facility] Gastroesophageal reflux disease, unspecified whether esophagitis present Patient reports condition is stable as long as he remains on Protonix 40 mg daily.  No complaints  Acquired hypothyroidism Patient reports compliance with 150 mg of levothyroxine daily.  Labs up-to-date  Obstructive sleep apnea (adult) (pediatric) Managed by pulmonology.  Spinal stenosis of lumbar region without neurogenic claudication/Peripheral neuropathy, hereditary/idiopathic After last visit patient reports the additional Lyrica dose did make a difference.  As well as adding the B12.  He is taking sublingual B12 daily.  He is due for labs today.  He reports compliance with Cymbalta 90 mg daily. Prior note He reports he has neuropathy of both his distal lower extremities and distal upper extremities.  He states he has been worked up by neurology and there is an unknown cause of his neuropathy.  He has been prescribed Cymbalta 90 mg daily and Lyrica 100 mg twice daily.  He states these are both very helpful, however he believes his neuropathy has been worsening over the last few months.  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 Blood pressures ranges at home within normal limits. .Patient denies chest pain, shortness of breath, dizziness or lower extremity edema.  Pt is  prescribed statin.  Patient is on chronic anticoagulation with aspirin/Plavix  Diabetes/obesity Pt reports compliance with Ozempic 0.5 mg weekly.  He states he is tolerating the medicine well.. Denies new numbness, tingling of extremities, hypo/hyperglycemic events or non-healing wounds.  Depression screen [facility] 2/9 06/03/2021 03/03/2021  Decreased Interest 1 1  Down, Depressed, Hopeless 0 0  PHQ - 2 Score 1 1  Altered sleeping - 0  Tired, decreased energy - 0  Change in appetite - 2  Feeling bad or failure about yourself  - 0  Trouble concentrating - 0  Moving slowly or fidgety/restless - 0  Suicidal thoughts - 0  PHQ-9 Score - 3   GAD 7 : Generalized Anxiety Score 03/03/2021  Nervous, Anxious, on Edge 0  Control/stop worrying 0  Worry too much - different things 0  Trouble relaxing 0  Restless 0  Easily annoyed or irritable 0  Afraid - awful might happen 0  Total GAD 7 Score 0       Fall Risk  06/03/2021 03/03/2021  Falls in the past year? 0 0  Number falls in past yr: 0 0  Injury with Fall? 0 0  Follow up Falls evaluation completed;Falls prevention discussed Falls evaluation completed   Immunization History  Administered Date(s) Administered   Fluad Quad(high Dose 65+) 06/12/2021   Influenza Split 08/09/2011, 11/02/2012, 06/28/2013, 07/16/2013   Influenza, High Dose Seasonal PF 06/18/2016, 08/14/2018   Influenza, Seasonal, Injecte, Preservative Fre 09/30/2015, 08/08/2017, 06/13/2019   PFIZER(Purple  Top)SARS-COV-2 Vaccination 11/23/2019, 12/14/2019, 07/15/2020   Pneumococcal Conjugate-13 09/30/2015   Pneumococcal Polysaccharide-23 07/16/2013   Tdap 10/16/2012   Zoster Recombinat (Shingrix) 06/13/2019, 08/14/2019    No results found.  Past Medical History:  Diagnosis Date   Allergies    Coronary artery disease    Depression 03/29/2020   Formatting of this note might be different from the original. Buproprion XL 150 mg qd   Diverticulitis    sigmoid   GERD (gastroesophageal reflux disease)    Glaucoma    Gout    Heart disease    Hypercholesteremia    Hypertension    Hypothyroid    Kidney stones 09/20/2016   x2, 1888 and 2070   Myocardial infarction [facility])    Neuropathy    Sleep apnea    Dr. Reece Levy   TIA (transient ischemic attack)    Torn meniscus 01/29/2006   Left knee.   Allergies  Allergen Reactions   Celebrex [Celecoxib] Swelling   Iodine Nausea Only   Past Surgical History:  Procedure Laterality Date   CARDIAC CATHETERIZATION  02/16/2012   [facility]   CARPAL TUNNEL RELEASE Bilateral    Left-01/15/2016.  Right-03/11/2016.   COLONOSCOPY W/ POLYPECTOMY  12/20/2016   4 mm sessile tubular adenoma w/multifoci high grade dysplasia '@65'$  cm   CORONARY ANGIOPLASTY WITH STENT PLACEMENT  09/16/2010   MI-cardiac catheterization-1 stent-OM.  [facility]   KNEE ARTHROSCOPY Left 01/29/2006   Torn meniscus   LESION EXCISION Left 04/01/2009   Benign.  [facility]   MOHS SURGERY     Family History  Problem Relation Age of Onset   Depression Mother    Diabetes Mother    Hyperlipidemia Mother    Hypertension Mother    Hypertension Father    Depression Sister    Hyperlipidemia Sister    Hypertension Sister    Alcohol abuse Brother    Diabetes Brother    Heart attack Brother    Heart disease Brother    Hyperlipidemia Brother    Hypertension Brother    Breast cancer Maternal Grandmother    Kidney disease Maternal  Grandfather    Stroke Paternal Grandmother    Stroke Paternal Grandfather    Heart disease Brother    Hyperlipidemia Brother    Hypertension Brother    Social History   Social History Narrative   Marital status/children/pets: Married.   Education/employment: Masters degree.  Retired from [employer].   Safety:      -smoke alarm in the home:Yes     - wears seatbelt: Yes       Allergies as of 06/12/2021       Reactions   Celebrex [celecoxib] Swelling   Iodine Nausea Only        Medication List        Accurate as of June 12, 2021  5:38 PM. If you have any questions, ask your nurse or doctor.          acetaminophen 650 MG CR tablet Commonly known as: TYLENOL Take by mouth.   allopurinol 300 MG tablet Commonly known as: ZYLOPRIM Take 1 tablet (300 mg total) by mouth daily.   Alpha-Lipoic Acid 300 MG Caps Take by mouth 2 (two) times  daily.   aspirin 81 MG chewable tablet Chew by mouth daily.   atorvastatin 80 MG tablet Commonly known as: LIPITOR Take 1 tablet (80 mg total) by mouth daily.   bimatoprost 0.03 % ophthalmic solution Commonly known as: LUMIGAN 1 drop at bedtime.   brimonidine-timolol 0.2-0.5 % ophthalmic solution Commonly known as: COMBIGAN Place 1 drop into both eyes every 12 (twelve) hours.   brinzolamide 1 % ophthalmic suspension Commonly known as: AZOPT Place 1 drop into the right eye 2 (two) times daily.   candesartan-hydrochlorothiazide 16-12.5 MG tablet Commonly known as: ATACAND HCT Take 1 tablet by mouth daily.   clopidogrel 75 MG tablet Commonly known as: PLAVIX Take 1 tablet (75 mg total) by mouth daily.   Co Q-10 100 MG Caps   DSS 100 MG Caps Take by mouth.   DULoxetine 30 MG capsule Commonly known as: CYMBALTA Take 3 capsules (90 mg total) by mouth daily.   fluticasone 50 MCG/ACT nasal spray Commonly known as: FLONASE Place into both nostrils.   levothyroxine 150 MCG tablet Commonly known  as: SYNTHROID Take 1 tablet (150 mcg total) by mouth daily.   loratadine 10 MG tablet Commonly known as: CLARITIN   metoprolol succinate 25 MG 24 hr tablet Commonly known as: TOPROL-XL Take 1 tablet (25 mg total) by mouth daily.   multivitamin capsule Take 1 capsule by mouth daily.   nitroGLYCERIN 0.4 MG SL tablet Commonly known as: NITROSTAT Place under the tongue.   Ozempic (0.25 or 0.5 MG/DOSE) 2 MG/1.5ML Sopn Generic drug: Semaglutide(0.25 or 0.'5MG'$ /DOS) Inject 0.5 mg as directed daily. What changed:  how much to take how to take this when to take this additional instructions Changed by: Howard Pouch, DO   pantoprazole 40 MG tablet Commonly known as: PROTONIX Take 1 tablet (40 mg total) by mouth daily.   PATADAY OP Apply to eye.   polyethylene glycol powder 17 GM/SCOOP powder Commonly known as: GLYCOLAX/MIRALAX Take by mouth.   pregabalin 150 MG capsule Commonly known as: LYRICA Take 1 capsule (150 mg total) by mouth 2 (two) times daily.        All past medical history, surgical history, allergies, family history, immunizations andmedications were updated in the EMR today and reviewed under the history and medication portions of their EMR.    Recent Results (from the past 2160 hour(s))  POCT HgB A1C     Status: Abnormal   Collection Time: 06/12/21  8:56 AM  Result Value Ref Range   Hemoglobin A1C 5.5 4.0 - 5.6 %   HbA1c POC (<> result, manual entry) 5.5 4.0 - 5.6 %   HbA1c, POC (prediabetic range) 5.5 (A) 5.7 - 6.4 %   HbA1c, POC (controlled diabetic range) 5.5 0.0 - 7.0 %    No results found.   ROS: 14 pt review of systems performed and negative (unless mentioned in an HPI)  Objective: BP 99/66   Pulse 68   Temp 98.5 F (36.9 C) (Oral)   Ht '5\' 8"'$  (1.727 m)   Wt 270 lb (122.5 kg)   SpO2 95%   BMI 41.05 kg/m  Gen: Afebrile. No acute distress.  Nontoxic, pleasant obese male HENT: AT. [place].  Eyes:Pupils Equal Round Reactive to light, Extraocular  movements intact,  Conjunctiva without redness, discharge or icterus. Neck/lymp/endocrine: Supple, no lymphadenopathy, no thyromegaly CV: RRR, no edema, +2/4 P posterior tibialis pulses Chest: CTAB, no wheeze or crackles Abd: Soft.  Obese. NTND.  Skin: no rashes, purpura or petechiae.  Neuro:  Normal gait. PERLA. EOMi. Alert. Oriented. x3 Psych: Normal affect, dress and demeanor. Normal speech. Normal thought content and judgment.    Assessment/plan: Juan Stanton is a 75 y.o. male present for  Gastroesophageal reflux disease, unspecified whether esophagitis present Stable.  Continue Protonix 40 mg daily  Acquired hypothyroidism Stable.  Continue levothyroxine 150 micrograms daily.    Obstructive sleep apnea (adult) (pediatric) Managed by pulmonology  Spinal stenosis of lumbar region without neurogenic claudication/Peripheral neuropathy, hereditary/idiopathic - Improving - continue Cymbalta 90 mg daily - continue  Lyrica to 150 mg twice daily. - B12 level repeated today.  He is noticing a positive change since starting the B12 sublingual.  Essential (primary) hypertension/Mixed hyperlipidemia/presence of coronary angioplasty implant and graft/Atherosclerosis of aorta (HCC)/Family history of heart disease/PAD (peripheral artery disease) (HCC)/Morbid obesity (HCC)-BMI 39.0-39.9,adult/Atherosclerosis of native coronary artery of native heart without angina pectoris/Acquired thrombophilia ([place]), chronic anticoag Stable Continue daily baby aspirin. Continue Plavix 75 mg daily Continue atorvastatin 80 mg daily. Continue candesartan/HCTZ 16-12.5 mg daily. 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  Continue Ozempic 0.5 mg weekly. - Hemoglobin A1c> 5.5 today Very tight control of his sugars are recommended given his cardiac conditions. Patient is established with podiatry Yearly eye exams encouraged-patient reports he is had his  eye exam this year.  Records requested. Influenza vaccine administered today  Gout: Stable Continue allopurinol 300 mg daily   Return in about 6 months (around 12/24/2021) for [facility] (30 min).  Orders Placed This Encounter  Procedures   Flu Vaccine QUAD High Dose(Fluad)   POCT HgB A1C   Meds ordered this encounter  Medications   allopurinol (ZYLOPRIM) 300 MG tablet    Sig: Take 1 tablet (300 mg total) by mouth daily.    Dispense:  90 tablet    Refill:  1   candesartan-hydrochlorothiazide (ATACAND HCT) 16-12.5 MG tablet    Sig: Take 1 tablet by mouth daily.    Dispense:  90 tablet    Refill:  1   clopidogrel (PLAVIX) 75 MG tablet    Sig: Take 1 tablet (75 mg total) by mouth daily.    Dispense:  90 tablet    Refill:  3   DULoxetine (CYMBALTA) 30 MG capsule    Sig: Take 3 capsules (90 mg total) by mouth daily.    Dispense:  270 capsule    Refill:  1   pantoprazole (PROTONIX) 40 MG tablet    Sig: Take 1 tablet (40 mg total) by mouth daily.    Dispense:  90 tablet    Refill:  1   Semaglutide,0.25 or 0.'5MG'$ /DOS, (OZEMPIC, 0.25 OR 0.5 MG/DOSE,) 2 MG/1.5ML SOPN    Sig: Inject 0.5 mg as directed daily.    Dispense:  6 mL    Refill:  2    Referral Orders  No referral(s) requested today     Note is dictated utilizing voice recognition software. Although note has been proof read prior to signing, occasional typographical errors still can be missed. If any questions arise, please do not hesitate to call for verification.  Electronically signed by: Howard Pouch, DO [place]

## 2021-06-22 ENCOUNTER — Ambulatory Visit: Payer: Medicare HMO | Admitting: Podiatry

## 2021-06-24 ENCOUNTER — Ambulatory Visit: Payer: Medicare HMO | Admitting: Podiatry

## 2021-06-24 ENCOUNTER — Other Ambulatory Visit: Payer: Self-pay

## 2021-06-24 DIAGNOSIS — E1169 Type 2 diabetes mellitus with other specified complication: Secondary | ICD-10-CM | POA: Diagnosis not present

## 2021-06-24 DIAGNOSIS — G609 Hereditary and idiopathic neuropathy, unspecified: Secondary | ICD-10-CM

## 2021-06-24 DIAGNOSIS — E669 Obesity, unspecified: Secondary | ICD-10-CM | POA: Diagnosis not present

## 2021-06-24 DIAGNOSIS — L84 Corns and callosities: Secondary | ICD-10-CM | POA: Diagnosis not present

## 2021-06-24 DIAGNOSIS — B351 Tinea unguium: Secondary | ICD-10-CM

## 2021-06-24 DIAGNOSIS — M79674 Pain in right toe(s): Secondary | ICD-10-CM

## 2021-06-24 DIAGNOSIS — M79675 Pain in left toe(s): Secondary | ICD-10-CM

## 2021-06-24 NOTE — Progress Notes (Signed)
  Subjective:  Patient ID: Juan Stanton, male    DOB: December 28, 1945,  MRN: [id number]  Chief Complaint  Patient presents with   Nail Problem    Routine foot care Nail trim 1-5 bilateral     75 y.o. male returns with the above complaint. History confirmed with patient.  Doing well, no new issues Objective:  Physical Exam: warm, good capillary refill, no trophic changes or ulcerative lesions and normal DP and PT pulses.  Absent light touch sensation and protective sensation distally.  He has nail dystrophy and residual onychomycosis of all nails.  Callus present bilateral hallux  Assessment:   1. Pain due to onychomycosis of toenails of both feet   2. Callus of foot   3. Diabetes mellitus type 2 in obese ([place])   4. Idiopathic neuropathy       Plan:  Patient was evaluated and treated and all questions answered.  Discussed the etiology and treatment options for the condition in detail with the patient. Educated patient on the topical and oral treatment options for mycotic nails. Recommended debridement of the nails today. Sharp and mechanical debridement performed of all painful and mycotic nails today. Nails debrided in length and thickness using a nail nipper. Discussed treatment options including appropriate shoe gear. Follow up as needed for painful nails.  All symptomatic hyperkeratoses were safely debrided with a sterile #15 blade to patient's level of comfort without incident. We discussed preventative and palliative care of these lesions including supportive and accommodative shoegear, padding, prefabricated and custom molded accommodative orthoses, use of a pumice stone and lotions/creams daily.    Return in about 3 months (around 09/23/2021) for at risk diabetic foot care.

## 2021-07-01 ENCOUNTER — Ambulatory Visit: Payer: Medicare HMO | Admitting: Family Medicine

## 2021-07-16 ENCOUNTER — Other Ambulatory Visit: Payer: Self-pay | Admitting: Family Medicine

## 2021-07-20 ENCOUNTER — Encounter: Payer: Self-pay | Admitting: Family Medicine

## 2021-07-20 ENCOUNTER — Other Ambulatory Visit: Payer: Self-pay

## 2021-07-20 ENCOUNTER — Ambulatory Visit (INDEPENDENT_AMBULATORY_CARE_PROVIDER_SITE_OTHER): Payer: Medicare HMO | Admitting: Family Medicine

## 2021-07-20 VITALS — BP 116/78 | HR 60 | Temp 97.7°F | Ht 68.0 in | Wt 268.0 lb

## 2021-07-20 DIAGNOSIS — M25551 Pain in right hip: Secondary | ICD-10-CM

## 2021-07-20 DIAGNOSIS — I700 Atherosclerosis of aorta: Secondary | ICD-10-CM | POA: Diagnosis not present

## 2021-07-20 DIAGNOSIS — Z1211 Encounter for screening for malignant neoplasm of colon: Secondary | ICD-10-CM

## 2021-07-20 DIAGNOSIS — Z125 Encounter for screening for malignant neoplasm of prostate: Secondary | ICD-10-CM | POA: Diagnosis not present

## 2021-07-20 DIAGNOSIS — Z6839 Body mass index (BMI) 39.0-39.9, adult: Secondary | ICD-10-CM

## 2021-07-20 DIAGNOSIS — M159 Polyosteoarthritis, unspecified: Secondary | ICD-10-CM

## 2021-07-20 DIAGNOSIS — Z0000 Encounter for general adult medical examination without abnormal findings: Secondary | ICD-10-CM | POA: Diagnosis not present

## 2021-07-20 DIAGNOSIS — D6869 Other thrombophilia: Secondary | ICD-10-CM

## 2021-07-20 DIAGNOSIS — M25512 Pain in left shoulder: Secondary | ICD-10-CM

## 2021-07-20 DIAGNOSIS — M25552 Pain in left hip: Secondary | ICD-10-CM

## 2021-07-20 DIAGNOSIS — E538 Deficiency of other specified B group vitamins: Secondary | ICD-10-CM | POA: Diagnosis not present

## 2021-07-20 DIAGNOSIS — Z1159 Encounter for screening for other viral diseases: Secondary | ICD-10-CM

## 2021-07-20 HISTORY — DX: Pain in right hip: M25.551

## 2021-07-20 HISTORY — DX: Pain in left shoulder: M25.512

## 2021-07-20 LAB — LIPID PANEL
Cholesterol: 140 mg/dL (ref 0–200)
HDL: 37.2 mg/dL — ABNORMAL LOW (ref >39.00)
LDL Cholesterol: 67 mg/dL (ref 0–99)
NonHDL: 102.72
Total CHOL/HDL Ratio: 4
Triglycerides: 179 mg/dL — ABNORMAL HIGH (ref 0.0–149.0)
VLDL: 35.8 mg/dL (ref 0.0–40.0)

## 2021-07-20 LAB — CBC
HCT: 43.5 % (ref 39.0–52.0)
Hemoglobin: 14.4 g/dL (ref 13.0–17.0)
MCHC: 33.2 g/dL (ref 30.0–36.0)
MCV: 94.8 fl (ref 78.0–100.0)
Platelets: 162 10*3/uL (ref 150.0–400.0)
RBC: 4.59 Mil/uL (ref 4.22–5.81)
RDW: 13.7 % (ref 11.5–15.5)
WBC: 6.9 10*3/uL (ref 4.0–10.5)

## 2021-07-20 LAB — PSA: PSA: 1.51 ng/mL (ref 0.10–4.00)

## 2021-07-20 LAB — VITAMIN B12: Vitamin B-12: 1501 pg/mL — ABNORMAL HIGH (ref 211–911)

## 2021-07-20 NOTE — Progress Notes (Signed)
This visit occurred during the SARS-CoV-2 public health emergency.  Safety protocols were in place, including screening questions prior to the visit, additional usage of staff PPE, and extensive cleaning of exam room while observing appropriate contact time as indicated for disinfecting solutions.    Patient ID: Juan Stanton, male  DOB: 11/17/1945, 75 y.o.   MRN: [id number] Patient Care Team    Relationship Specialty Notifications Start End  Ma Hillock, DO PCP - General Family Medicine  03/03/21   Sharyne Peach, MD Consulting Physician Ophthalmology  03/02/21   Alfonso Patten, MD Referring Physician Cardiology  03/02/21   Ulla Gallo, MD Consulting Physician Dermatology  03/02/21   Criselda Peaches, DPM Consulting Physician Podiatry  03/02/21   Elmarie Mainland, MD Consulting Physician Sleep Medicine  03/02/21     Chief Complaint  Patient presents with   Annual Exam    Pt is not fasting    Subjective:  Juan Stanton is a 75 y.o. male present for CPE/acute. All past medical history, surgical history, allergies, family history, immunizations, medications and social history were updated in the electronic medical record today. All recent labs, ED visits and hospitalizations within the last year were reviewed.  Health maintenance:  Colonoscopy: last screen 11/2016- 5 yr recall. Referral placed.  Immunizations:  tdap 09/2012, influenza 2022, PNA series completed, zostavax series completed. Covid series completed Infectious disease screening: Hep C completed.  PSA: No results found for: PSA, pt was counseled on prostate cancer screenings.  Assistive device: none Oxygen QIH:KVQQ Patient has a Dental home. Hospitalizations/ED visits: reviewed  Patient reports he is having left shoulder pain.  He has a history of bursitis in his shoulder and occasionally will receive steroid injections if needed.  He has not tried physical therapy in the past.  NSAIDs contraindicated secondary to  chronic anticoagulation.  Patient is a diabetic. He also reports bilateral hip pain.  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  He states he has noticed more discomfort in both of his hips equally, mostly after walking.  Given that he cannot take any anti-inflammatories, and Tylenol is not effective he is wondering if it is getting to the point where surgery would be recommended for him.  Depression screen [facility] 2/9 07/20/2021 06/03/2021 03/03/2021  Decreased Interest 1 1 1   Down, Depressed, Hopeless 1 0 0  PHQ - 2 Score 2 1 1   Altered sleeping 1 - 0  Tired, decreased energy 1 - 0  Change in appetite 0 - 2  Feeling bad or failure about yourself  0 - 0  Trouble concentrating 0 - 0  Moving slowly or fidgety/restless 0 - 0  Suicidal thoughts 0 - 0  PHQ-9 Score 4 - 3   GAD 7 : Generalized Anxiety Score 07/20/2021 03/03/2021  Nervous, Anxious, on Edge 0 0  Control/stop worrying 0 0  Worry too much - different things 0 0  Trouble relaxing 0 0  Restless 0 0  Easily annoyed or irritable 0 0  Afraid - awful might happen 0 0  Total GAD 7 Score 0 0       Fall Risk  07/20/2021 06/03/2021 03/03/2021  Falls in the past year? 0 0 0  Number falls in past yr: 0 0 0  Injury with Fall? 0 0 0  Risk for fall due to : No Fall Risks - -  Follow up Falls evaluation completed Falls evaluation completed;Falls prevention discussed Falls evaluation completed  Immunization History  Administered Date(s) Administered   Fluad Quad(high Dose 65+) 06/12/2021   Influenza Split 08/09/2011, 11/02/2012, 06/28/2013, 07/16/2013   Influenza, High Dose Seasonal PF 06/18/2016, 08/14/2018   Influenza, Seasonal, Injecte, Preservative Fre 09/30/2015, 08/08/2017, 06/13/2019   PFIZER(Purple Top)SARS-COV-2 Vaccination 11/23/2019, 12/14/2019, 07/15/2020   Pneumococcal Conjugate-13 09/30/2015   Pneumococcal Polysaccharide-23 07/16/2013   Tdap 10/16/2012   Zoster Recombinat (Shingrix) 06/13/2019, 08/14/2019    Past  Medical History:  Diagnosis Date   Allergies    Allergy 1990   Arthritis 2010   Coronary artery disease    Depression 03/29/2020   Formatting of this note might be different from the original. Buproprion XL 150 mg qd   Diverticulitis    sigmoid   GERD (gastroesophageal reflux disease)    Glaucoma    Gout    Heart disease    Hypercholesteremia    Hypertension    Hypothyroid    Kidney stones 09/20/2016   x2, 1888 and 2070   Myocardial infarction [facility])    Neuromuscular disorder ([place]) 1995   Neuropathy    Sleep apnea    Dr. Reece Levy   Stroke [facility]) 2002   TIA   TIA (transient ischemic attack)    Torn meniscus 01/29/2006   Left knee.   Allergies  Allergen Reactions   Celebrex [Celecoxib] Swelling   Iodine Nausea Only   Past Surgical History:  Procedure Laterality Date   CARDIAC CATHETERIZATION  02/16/2012   [facility]   CARPAL TUNNEL RELEASE Bilateral    Left-01/15/2016.  Right-03/11/2016.   COLONOSCOPY W/ POLYPECTOMY  12/20/2016   4 mm sessile tubular adenoma w/multifoci high grade dysplasia @65  cm   CORONARY ANGIOPLASTY WITH STENT PLACEMENT  09/16/2010   MI-cardiac catheterization-1 stent-OM.  [facility]   EYE SURGERY  2017   KNEE ARTHROSCOPY Left 01/29/2006   Torn meniscus   LESION EXCISION Left 04/01/2009   Benign.  [facility]   MOHS SURGERY     Family History  Problem Relation Age of Onset   Depression Mother    Diabetes Mother    Hyperlipidemia Mother    Hypertension Mother    Hypertension Father    Depression Sister    Hyperlipidemia Sister    Hypertension Sister    Alcohol abuse Brother    Diabetes Brother    Heart attack Brother    Heart disease Brother    Hyperlipidemia Brother    Hypertension Brother    Breast cancer Maternal Grandmother    Kidney disease Maternal Grandfather    Stroke Paternal Grandmother    Stroke Paternal Grandfather    Heart disease Brother    Hyperlipidemia Brother     Hypertension Brother    Social History   Social History Narrative   Marital status/children/pets: Married.   Education/employment: Masters degree.  Retired from [employer].   Safety:      -smoke alarm in the home:Yes     - wears seatbelt: Yes       Allergies as of 07/20/2021       Reactions   Celebrex [celecoxib] Swelling   Iodine Nausea Only        Medication List        Accurate as of July 20, 2021 12:34 PM. If you have any questions, ask your nurse or doctor.          STOP taking these medications    desloratadine 5 MG tablet Commonly known as: CLARINEX Stopped by: Joseph Art  Taquilla Downum, DO       TAKE these medications    acetaminophen 650 MG CR tablet Commonly known as: TYLENOL Take by mouth.   allopurinol 300 MG tablet Commonly known as: ZYLOPRIM Take 1 tablet (300 mg total) by mouth daily.   Alpha-Lipoic Acid 300 MG Caps Take by mouth 2 (two) times daily.   aspirin 81 MG chewable tablet Chew by mouth daily.   atorvastatin 80 MG tablet Commonly known as: LIPITOR Take 1 tablet (80 mg total) by mouth daily.   bimatoprost 0.03 % ophthalmic solution Commonly known as: LUMIGAN 1 drop at bedtime.   brimonidine-timolol 0.2-0.5 % ophthalmic solution Commonly known as: COMBIGAN Place 1 drop into both eyes every 12 (twelve) hours.   brinzolamide 1 % ophthalmic suspension Commonly known as: AZOPT Place 1 drop into the right eye 2 (two) times daily.   candesartan-hydrochlorothiazide 16-12.5 MG tablet Commonly known as: ATACAND HCT Take 1 tablet by mouth daily.   clopidogrel 75 MG tablet Commonly known as: PLAVIX Take 1 tablet (75 mg total) by mouth daily.   Co Q-10 100 MG Caps   DSS 100 MG Caps Take by mouth.   DULoxetine 30 MG capsule Commonly known as: CYMBALTA Take 3 capsules (90 mg total) by mouth daily.   fluticasone 50 MCG/ACT nasal spray Commonly known as: FLONASE Place into both nostrils.   levothyroxine 150  MCG tablet Commonly known as: SYNTHROID Take 1 tablet (150 mcg total) by mouth daily.   loratadine 10 MG tablet Commonly known as: CLARITIN   metoprolol succinate 25 MG 24 hr tablet Commonly known as: TOPROL-XL Take 1 tablet (25 mg total) by mouth daily.   multivitamin capsule Take 1 capsule by mouth daily.   nitroGLYCERIN 0.4 MG SL tablet Commonly known as: NITROSTAT Place under the tongue.   Ozempic (0.25 or 0.5 MG/DOSE) 2 MG/1.5ML Sopn Generic drug: Semaglutide(0.25 or 0.5MG /DOS) Inject 0.5 mg as directed daily.   pantoprazole 40 MG tablet Commonly known as: PROTONIX Take 1 tablet (40 mg total) by mouth daily.   PATADAY OP Apply to eye.   polyethylene glycol powder 17 GM/SCOOP powder Commonly known as: GLYCOLAX/MIRALAX Take by mouth.   pregabalin 150 MG capsule Commonly known as: LYRICA Take 1 capsule (150 mg total) by mouth 2 (two) times daily. What changed: Another medication with the same name was removed. Continue taking this medication, and follow the directions you see here. Changed by: Howard Pouch, DO       All past medical history, surgical history, allergies, family history, immunizations andmedications were updated in the EMR today and reviewed under the history and medication portions of their EMR.     Recent Results (from the past 2160 hour(s))  POCT HgB A1C     Status: Abnormal   Collection Time: 06/12/21  8:56 AM  Result Value Ref Range   Hemoglobin A1C 5.5 4.0 - 5.6 %   HbA1c POC (<> result, manual entry) 5.5 4.0 - 5.6 %   HbA1c, POC (prediabetic range) 5.5 (A) 5.7 - 6.4 %   HbA1c, POC (controlled diabetic range) 5.5 0.0 - 7.0 %    No results found.   ROS: 14 pt review of systems performed and negative (unless mentioned in an HPI)  Objective: BP 116/78   Pulse 60   Temp 97.7 F (36.5 C) (Oral)   Ht 5\' 8"  (1.727 m)   Wt 268 lb (121.6 kg)   SpO2 95%   BMI 40.75 kg/m  Gen: Afebrile. No acute  distress. Nontoxic in appearance,  well-developed, well-nourished, pleasant obese male. HENT: AT. [place]. Bilateral TM visualized and normal in appearance, normal external auditory canal. MMM, no oral lesions, adequate dentition. Bilateral nares within normal limits. Throat without erythema, ulcerations or exudates.  No cough on exam, no hoarseness on exam. Eyes:Pupils Equal Round Reactive to light, Extraocular movements intact,  Conjunctiva without redness, discharge or icterus. Neck/lymp/endocrine: Supple, no lymphadenopathy, no thyromegaly CV: RRR 1/6, no edema, +2/4 P posterior tibialis pulses.  Chest: CTAB, no wheeze, rhonchi or crackles.  Normal respiratory effort.  Good air movement. Abd: Soft.  Obese. NTND. BS present.  No masses palpated. No hepatosplenomegaly. No rebound tenderness or guarding. Skin: No rashes, purpura or petechiae. Warm and well-perfused. Skin intact. Neuro/Msk:  Normal gait. PERLA. EOMi. Alert. Oriented x3.  Cranial nerves II through XII intact. Muscle strength 5/5 upper/lower extremity. DTRs equal bilaterally. Psych: Normal affect, dress and demeanor. Normal speech. Normal thought content and judgment.  No results found.  Assessment/plan: Juan Stanton is a 75 y.o. male present for CPE/acute Acquired thrombophilia ([place]), chronic anticoag - CBC BMI 39.0-39.9,adult/Morbid obesity (HCC) Continue exercise and dietary modifications Atherosclerosis of aorta (HCC) - Lipid panel B12 deficiency - B12 Continue supplementation.  He is uncertain of total dose. Need for hepatitis C screening test - Hepatitis C antibody Colon cancer screening - Ambulatory referral to Gastroenterology Prostate cancer screening - PSA Bilateral hip pain-arthritis -History of arthritis.  NSAIDs contraindicated. Ambulatory referral to Orthopedic Surgery Acute pain of left shoulder-arthritis - Ambulatory referral to Physical Therapy -History of bursitis.  NSAIDs contraindicated.  Diabetic would prefer not to use steroids  unless absolutely necessary. Routine general medical examination at a health care facility Colonoscopy: last screen 11/2016- 5 yr recall. Referral placed.  Immunizations:  tdap 09/2012, influenza 2022, PNA series completed, zostavax series completed. Covid series completed Infectious disease screening: Hep C completed.  PSA: No results found for: PSA, pt was counseled on prostate cancer screenings.  Patient was encouraged to exercise greater than 150 minutes a week. Patient was encouraged to choose a diet filled with fresh fruits and vegetables, and lean meats. AVS provided to patient today for education/recommendation on gender specific health and safety maintenance. Return in about 4 months (around 11/20/2021) for [place] (30 min).  Orders Placed This Encounter  Procedures   CBC   Lipid panel   PSA   Hepatitis C antibody   B12   Ambulatory referral to Gastroenterology   Ambulatory referral to Orthopedic Surgery   Ambulatory referral to Physical Therapy   Orders Placed This Encounter  Procedures   CBC   Lipid panel   PSA   Hepatitis C antibody   B12   Ambulatory referral to Gastroenterology   Ambulatory referral to Orthopedic Surgery   Ambulatory referral to Physical Therapy   No orders of the defined types were placed in this encounter.  Referral Orders         Ambulatory referral to Gastroenterology         Ambulatory referral to Orthopedic Surgery         Ambulatory referral to Physical Therapy       Note is dictated utilizing voice recognition software. Although note has been proof read prior to signing, occasional typographical errors still can be missed. If any questions arise, please do not hesitate to call for verification.  Electronically signed by: Howard Pouch, DO [place]

## 2021-07-20 NOTE — Patient Instructions (Addendum)
Great to see you today.  I have refilled the medication(s) we provide.   If labs were collected, we will inform you of lab results once received either by echart message or telephone call.   - echart message- for normal results that have been seen by the patient already.   - telephone call: abnormal results or if patient has not viewed results in their echart.  [facility]  Address:  [address], [place], [place] [address]  Phone: [phone]   Health Maintenance After Age 89 After age 22, you are at a higher risk for certain long-term diseases and infections as well as injuries from falls. Falls are a major cause of broken bones and head injuries in people who are older than age 66. Getting regular preventive care can help to keep you healthy and well. Preventive care includes getting regular testing and making lifestyle changes as recommended by your health care provider. Talk with your health care provider about: Which screenings and tests you should have. A screening is a test that checks for a disease when you have no symptoms. A diet and exercise plan that is right for you. What should I know about screenings and tests to prevent falls? Screening and testing are the best ways to find a health problem early. Early diagnosis and treatment give you the best chance of managing medical conditions that are common after age 39. Certain conditions and lifestyle choices may make you more likely to have a fall. Your health care provider may recommend: Regular vision checks. Poor vision and conditions such as cataracts can make you more likely to have a fall. If you wear glasses, make sure to get your prescription updated if your vision changes. Medicine review. Work with your health care provider to regularly review all of the medicines you are taking, including over-the-counter medicines. Ask your health care provider about any side effects that may make you more likely to  have a fall. Tell your health care provider if any medicines that you take make you feel dizzy or sleepy. Osteoporosis screening. Osteoporosis is a condition that causes the bones to get weaker. This can make the bones weak and cause them to break more easily. Blood pressure screening. Blood pressure changes and medicines to control blood pressure can make you feel dizzy. Strength and balance checks. Your health care provider may recommend certain tests to check your strength and balance while standing, walking, or changing positions. Foot health exam. Foot pain and numbness, as well as not wearing proper footwear, can make you more likely to have a fall. Depression screening. You may be more likely to have a fall if you have a fear of falling, feel emotionally low, or feel unable to do activities that you used to do. Alcohol use screening. Using too much alcohol can affect your balance and may make you more likely to have a fall. What actions can I take to lower my risk of falls? General instructions Talk with your health care provider about your risks for falling. Tell your health care provider if: You fall. Be sure to tell your health care provider about all falls, even ones that seem minor. You feel dizzy, sleepy, or off-balance. Take over-the-counter and prescription medicines only as told by your health care provider. These include any supplements. Eat a healthy diet and maintain a healthy weight. A healthy diet includes low-fat dairy products, low-fat (lean) meats, and fiber from whole grains, beans, and lots of fruits and vegetables.  Home safety Remove any tripping hazards, such as rugs, cords, and clutter. Install safety equipment such as grab bars in bathrooms and safety rails on stairs. Keep rooms and walkways well-lit. Activity  Follow a regular exercise program to stay fit. This will help you maintain your balance. Ask your health care provider what types of exercise are appropriate  for you. If you need a cane or walker, use it as recommended by your health care provider. Wear supportive shoes that have nonskid soles. Lifestyle Do not drink alcohol if your health care provider tells you not to drink. If you drink alcohol, limit how much you have: 0-1 drink a day for women. 0-2 drinks a day for men. Be aware of how much alcohol is in your drink. In the [place] one drink equals one typical bottle of beer (12 oz), one-half glass of wine (5 oz), or one shot of hard liquor (1 oz). Do not use any products that contain nicotine or tobacco, such as cigarettes and e-cigarettes. If you need help quitting, ask your health care provider. Summary Having a healthy lifestyle and getting preventive care can help to protect your health and wellness after age 55. Screening and testing are the best way to find a health problem early and help you avoid having a fall. Early diagnosis and treatment give you the best chance for managing medical conditions that are more common for people who are older than age 61. Falls are a major cause of broken bones and head injuries in people who are older than age 20. Take precautions to prevent a fall at home. Work with your health care provider to learn what changes you can make to improve your health and wellness and to prevent falls. This information is not intended to replace advice given to you by your health care provider. Make sure you discuss any questions you have with your health care provider. Document Revised: 11/21/2020 Document Reviewed: 08/29/2020 Elsevier Patient Education  2022 [employer].

## 2021-07-21 LAB — HEPATITIS C ANTIBODY
Hepatitis C Ab: NONREACTIVE
SIGNAL TO CUT-OFF: 0.04 (ref <1.00)

## 2021-08-04 ENCOUNTER — Telehealth: Payer: Self-pay

## 2021-08-04 ENCOUNTER — Ambulatory Visit (INDEPENDENT_AMBULATORY_CARE_PROVIDER_SITE_OTHER): Payer: Medicare HMO | Admitting: Internal Medicine

## 2021-08-04 ENCOUNTER — Encounter: Payer: Self-pay | Admitting: Internal Medicine

## 2021-08-04 DIAGNOSIS — K5900 Constipation, unspecified: Secondary | ICD-10-CM

## 2021-08-04 DIAGNOSIS — K219 Gastro-esophageal reflux disease without esophagitis: Secondary | ICD-10-CM

## 2021-08-04 DIAGNOSIS — Z86010 Personal history of colonic polyps: Secondary | ICD-10-CM

## 2021-08-04 MED ORDER — NA SULFATE-K SULFATE-MG SULF 17.5-3.13-1.6 GM/177ML PO SOLN: 1.0000 | ORAL | 0 refills | Status: DC

## 2021-08-04 NOTE — Patient Instructions (Signed)
If you are age 73 or older, your body mass index should be between 23-30. Your Body mass index is 41.55 kg/m. If this is out of the aforementioned range listed, please consider follow up with your Primary Care Provider.  If you are age 76 or younger, your body mass index should be between 19-25. Your Body mass index is 41.55 kg/m. If this is out of the aformentioned range listed, please consider follow up with your Primary Care Provider.   ________________________________________________________  The [facility] providers would like to encourage you to use [facility] to communicate with providers for non-urgent requests or questions.  Due to long hold times on the telephone, sending your provider a message by [facility] may be a faster and more efficient way to get a response.  Please allow 48 business hours for a response.  Please remember that this is for non-urgent requests.  _______________________________________________________  Dennis Bast have been scheduled for an endoscopy and colonoscopy. Please follow the written instructions given to you at your visit today. Please pick up your prep supplies at the pharmacy within the next 1-3 days. If you use inhalers (even only as needed), please bring them with you on the day of your procedure.  Due to recent changes in healthcare laws, you may see the results of your imaging and laboratory studies on MyChart before your provider has had a chance to review them.  We understand that in some cases there may be results that are confusing or concerning to you. Not all laboratory results come back in the same time frame and the provider may be waiting for multiple results in order to interpret others.  Please give [place] 48 hours in order for your provider to thoroughly review all the results before contacting the office for clarification of your results.   You will be contacted by our office prior to your procedure for directions on holding your Plavix.  If you do not hear  from our office 1 week prior to your scheduled procedure, please call [phone] to discuss.   Thank you for entrusting me with your care and choosing [facility].  Dr Ardis Hughs

## 2021-08-04 NOTE — Progress Notes (Signed)
Chief Complaint: History of colon polyps  HPI : 75 year old male with history of CAD s/p PCI on Plavix, TIA, OSA presents for consideration of colonoscopy  He has a history of colon polyps.  Denies hematochezia or melena. Denies weight loss. He takes daily Miralax for constipation and is now going once day with the laxative. He also has heartburn issues for which he takes Protonix three times weekly. Symptoms are pretty well controlled with PPI therapy. Per intake form, denies dysphagia, N&V, or abdominal pain. Denies fam hx of GI cancers. He is on Plavix. Denies prior abdominal surgeries. Last colonoscopy was in 2018 with a colon polyp with high grade dysplasia. He has had an EGD in the remote past.   Past Medical History:  Diagnosis Date   Allergies    Allergy 1990   Arthritis 2010   Colon polyps    Coronary artery disease    Depression 03/29/2020   Formatting of this note might be different from the original. Buproprion XL 150 mg qd   Diverticulitis    sigmoid   GERD (gastroesophageal reflux disease)    Glaucoma    Gout    Heart disease    Hypercholesteremia    Hypertension    Hypothyroid    Kidney stones 09/20/2016   x2, 1888 and 2070   Myocardial infarction [facility])    Neuromuscular disorder ([place]) 1995   Neuropathy    Prediabetes    Sleep apnea    Dr. Reece Levy   Stroke [facility]) 2002   TIA   TIA (transient ischemic attack)    Torn meniscus 01/29/2006   Left knee.     Past Surgical History:  Procedure Laterality Date   CARDIAC CATHETERIZATION  02/16/2012   [facility]   CARPAL TUNNEL RELEASE Bilateral    Left-01/15/2016.  Right-03/11/2016.   COLONOSCOPY W/ POLYPECTOMY  12/20/2016   4 mm sessile tubular adenoma w/multifoci high grade dysplasia @65  cm   CORONARY ANGIOPLASTY WITH STENT PLACEMENT  09/16/2010   MI-cardiac catheterization-1 stent-OM.  [facility]   EYE SURGERY  2017   KNEE ARTHROSCOPY Left 01/29/2006   Torn meniscus   LESION  EXCISION Left 04/01/2009   Benign.  [facility]   [place]     Family History  Problem Relation Age of Onset   Depression Mother    Diabetes Mother    Hyperlipidemia Mother    Hypertension Mother    Hypertension Father    Depression Sister    Hyperlipidemia Sister    Hypertension Sister    Alcohol abuse Brother    Diabetes Brother    Heart attack Brother    Heart disease Brother    Hyperlipidemia Brother    Hypertension Brother    Breast cancer Maternal Grandmother    Kidney disease Maternal Grandfather    Stroke Paternal Grandmother    Stroke Paternal Grandfather    Heart disease Brother    Hyperlipidemia Brother    Hypertension Brother    Social History   Tobacco Use   Smoking status: Former    Packs/day: 3.00    Years: 5.00    Pack years: 15.00    Types: Cigarettes    Quit date: 02/11/1980    Years since quitting: 41.5   Smokeless tobacco: Never  Vaping Use   Vaping Use: Never used  Substance Use Topics   Alcohol use: Yes    Alcohol/week: 2.0 standard drinks    Types: 2 Cans of  beer per week   Drug use: No   Current Outpatient Medications  Medication Sig Dispense Refill   acetaminophen (TYLENOL) 650 MG CR tablet Take by mouth.     allopurinol (ZYLOPRIM) 300 MG tablet Take 1 tablet (300 mg total) by mouth daily. 90 tablet 1   Alpha-Lipoic Acid 300 MG CAPS Take by mouth 2 (two) times daily.     aspirin 81 MG chewable tablet Chew by mouth daily.     atorvastatin (LIPITOR) 80 MG tablet Take 1 tablet (80 mg total) by mouth daily. 90 tablet 3   bimatoprost (LUMIGAN) 0.03 % ophthalmic solution 1 drop at bedtime.     brimonidine-timolol (COMBIGAN) 0.2-0.5 % ophthalmic solution Place 1 drop into both eyes every 12 (twelve) hours.     brinzolamide (AZOPT) 1 % ophthalmic suspension Place 1 drop into the right eye 2 (two) times daily.     candesartan-hydrochlorothiazide (ATACAND HCT) 16-12.5 MG tablet Take 1 tablet by mouth daily. 90 tablet 1    clopidogrel (PLAVIX) 75 MG tablet Take 1 tablet (75 mg total) by mouth daily. 90 tablet 3   Coenzyme Q10 (CO Q-10) 100 MG CAPS      Docusate Sodium (DSS) 100 MG CAPS Take by mouth.     DULoxetine (CYMBALTA) 30 MG capsule Take 3 capsules (90 mg total) by mouth daily. 270 capsule 1   fluticasone (FLONASE) 50 MCG/ACT nasal spray Place into both nostrils.     levothyroxine (SYNTHROID) 150 MCG tablet Take 1 tablet (150 mcg total) by mouth daily. 90 tablet 3   metoprolol succinate (TOPROL-XL) 25 MG 24 hr tablet Take 1 tablet (25 mg total) by mouth daily. 90 tablet 1   Multiple Vitamin (MULTIVITAMIN) capsule Take 1 capsule by mouth daily.     nitroGLYCERIN (NITROSTAT) 0.4 MG SL tablet Place under the tongue.     Olopatadine HCl (PATADAY OP) Apply to eye.     pantoprazole (PROTONIX) 40 MG tablet Take 1 tablet (40 mg total) by mouth daily. 90 tablet 1   polyethylene glycol powder (GLYCOLAX/MIRALAX) 17 GM/SCOOP powder Take by mouth.     pregabalin (LYRICA) 150 MG capsule Take 1 capsule (150 mg total) by mouth 2 (two) times daily. 180 capsule 1   Semaglutide,0.25 or 0.5MG /DOS, (OZEMPIC, 0.25 OR 0.5 MG/DOSE,) 2 MG/1.5ML SOPN Inject 0.5 mg as directed daily. 6 mL 2   No current facility-administered medications for this visit.   Allergies  Allergen Reactions   Celebrex [Celecoxib] Swelling   Iodine Nausea Only     Review of Systems: All systems reviewed and negative except where noted in HPI.   Physical Exam: BP 116/80   Pulse 85   Ht 5\' 8"  (1.727 m)   Wt 273 lb 4 oz (123.9 kg)   BMI 41.55 kg/m  Constitutional: Pleasant,well-developed, male in no acute distress. HEENT: Normocephalic and atraumatic. Conjunctivae are normal. No scleral icterus. Cardiovascular: Normal rate, regular rhythm.  Pulmonary/chest: Effort normal and breath sounds normal. No wheezing, rales or rhonchi. Abdominal: Soft, nondistended, nontender. Bowel sounds active throughout. There are no masses palpable. No  hepatomegaly. Extremities: No edema Neurological: Alert and oriented to person place and time. Skin: Skin is warm and dry. No rashes noted. Psychiatric: Normal mood and affect. Behavior is normal.  Labs 02/2021: Unremarkable CMP  Labs 06/2021: Nml CBC  CT renal stone study 09/21/16: IMPRESSION: 4 mm calculus in the left ureter at the L4 level. There is mild hydronephrosis on the left.  There is urinary bladder wall  thickening, felt to represent a degree of cystitis.  There are multiple sigmoid diverticula without diverticulitis. No bowel obstruction. No abscess. Appendix appears normal.  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  Prominent liver without focal lesion evident.  Spinal stenosis at L4-5, multifactorial.  Colonoscopy 12/20/16: 4 mm sigmoid colon polyp Path: TA with high grade dysplasia  ASSESSMENT AND PLAN: History of adenoma with high grade dysplasia Constipation GERD Per review of his most recent colonoscopy in 2018, he had 1 adenoma with high-grade dysplasia, which requires 3-year polyp surveillance.  Thus we will plan to move forward with follow up colonoscopy.  Patient has several risk factors for Barrett's esophagus including GERD, male sex, and age.  We will plan to add on an EGD to his colonoscopy. - Continue Miralax QD - Continue Protonix QD - EGD/colonoscopy [facility]. Will need Plavix held beforehand.  We will touch base with Dr. Gabrielle Dare with [facility].  [name], MD

## 2021-08-04 NOTE — Telephone Encounter (Signed)
   Juan Stanton Nov 16, 1945 [id number]  Dear Dr Atilano Median:  We have scheduled the above named patient for an endoscopy and colonoscopy procedure. Our records show that he is on anticoagulation therapy.  Please advise as to whether the patient may come off their therapy of Plavix 5 days prior to their procedure which is scheduled for 08-13-21.  Please route your response to [place], [place] or fax response to [phone].  Sincerely,    [facility]

## 2021-08-07 NOTE — Telephone Encounter (Signed)
Called to Dr Gilman Schmidt office and spoke with Jeani Hawking.  Jeani Hawking stated that they had not received clearance fax.  Refaxed clearance form to [phone].  Jeani Hawking received the fax and stated she would hand deliver to Dr Gilman Schmidt nurse.  Will follow up this afternoon.

## 2021-08-07 NOTE — Telephone Encounter (Signed)
Received fax from [facility] that patient can hold Plavix.  Patient advised that he has been given clearance to hold Plavix 5 days prior to endoscopy and colonoscopy scheduled for 08-13-21.  Patient advised to take last dose of Plavix on 08-07-21, and he will be advised when to restart Plavix by Dr Lorenso Courier after the procedure.  Patient agreed to plan and verbalized understanding.  No further questions.

## 2021-08-10 ENCOUNTER — Ambulatory Visit: Payer: Medicare HMO | Admitting: Family Medicine

## 2021-08-13 ENCOUNTER — Ambulatory Visit (AMBULATORY_SURGERY_CENTER): Payer: Medicare HMO | Admitting: Internal Medicine

## 2021-08-13 ENCOUNTER — Encounter: Payer: Self-pay | Admitting: Internal Medicine

## 2021-08-13 VITALS — BP 131/80 | HR 64 | Temp 97.5°F | Resp 13 | Ht 68.0 in | Wt 273.0 lb

## 2021-08-13 DIAGNOSIS — K317 Polyp of stomach and duodenum: Secondary | ICD-10-CM | POA: Diagnosis not present

## 2021-08-13 DIAGNOSIS — K319 Disease of stomach and duodenum, unspecified: Secondary | ICD-10-CM

## 2021-08-13 DIAGNOSIS — R12 Heartburn: Secondary | ICD-10-CM | POA: Diagnosis not present

## 2021-08-13 DIAGNOSIS — Z86010 Personal history of colonic polyps: Secondary | ICD-10-CM

## 2021-08-13 DIAGNOSIS — Z538 Procedure and treatment not carried out for other reasons: Secondary | ICD-10-CM

## 2021-08-13 DIAGNOSIS — K219 Gastro-esophageal reflux disease without esophagitis: Secondary | ICD-10-CM

## 2021-08-13 DIAGNOSIS — K2970 Gastritis, unspecified, without bleeding: Secondary | ICD-10-CM | POA: Diagnosis not present

## 2021-08-13 MED ORDER — SODIUM CHLORIDE 0.9 % IV SOLN: 500.0000 mL | INTRAVENOUS | Status: DC

## 2021-08-13 NOTE — Progress Notes (Signed)
GASTROENTEROLOGY PROCEDURE H&P NOTE   Primary Care Physician: Ma Hillock, DO    Reason for Procedure:   History of colon polyp, GERD, screening for Barrett's  Plan:    EGD/colonoscopy  Patient is appropriate for endoscopic procedure(s) in the ambulatory ([facility]) setting.  The nature of the procedure, as well as the risks, benefits, and alternatives were carefully and thoroughly reviewed with the patient. Ample time for discussion and questions allowed. The patient understood, was satisfied, and agreed to proceed.     HPI: Juan Stanton is a 75 y.o. male who presents for EGD/colonoscopy for evaluation of polyp surveillance, GERD, Barrett's screening .  Patient was most recently seen in the Gastroenterology Clinic on 08/04/21.  No interval change in medical history since that appointment. Please refer to that note for full details regarding GI history and clinical presentation.   Past Medical History:  Diagnosis Date   Allergies    Allergy 1990   Arthritis 2010   Colon polyps    Coronary artery disease    Depression 03/29/2020   Formatting of this note might be different from the original. Buproprion XL 150 mg qd   Diverticulitis    sigmoid   GERD (gastroesophageal reflux disease)    Glaucoma    Gout    Heart disease    Hypercholesteremia    Hypertension    Hypothyroid    Kidney stones 09/20/2016   x2, 1888 and 2070   Myocardial infarction [facility])    Neuromuscular disorder ([place]) 1995   Neuropathy    Prediabetes    Sleep apnea    Dr. Reece Levy   Stroke [facility]) 2002   TIA   TIA (transient ischemic attack)    Torn meniscus 01/29/2006   Left knee.    Past Surgical History:  Procedure Laterality Date   CARDIAC CATHETERIZATION  02/16/2012   [facility]   CARPAL TUNNEL RELEASE Bilateral    Left-01/15/2016.  Right-03/11/2016.   COLONOSCOPY W/ POLYPECTOMY  12/20/2016   4 mm sessile tubular adenoma w/multifoci high grade dysplasia @65  cm   CORONARY  ANGIOPLASTY WITH STENT PLACEMENT  09/16/2010   MI-cardiac catheterization-1 stent-OM.  [facility]   EYE SURGERY  2017   KNEE ARTHROSCOPY Left 01/29/2006   Torn meniscus   LESION EXCISION Left 04/01/2009   Benign.  [facility]   MOHS SURGERY      Prior to Admission medications   Medication Sig Start Date End Date Taking? Authorizing Provider  allopurinol (ZYLOPRIM) 300 MG tablet Take 1 tablet (300 mg total) by mouth daily. 06/12/21  Yes Kuneff, Renee A, DO  Alpha-Lipoic Acid 300 MG CAPS Take by mouth 2 (two) times daily.   Yes [provider]  aspirin 81 MG chewable tablet Chew by mouth daily.   Yes [provider]  atorvastatin (LIPITOR) 80 MG tablet Take 1 tablet (80 mg total) by mouth daily. 03/03/21  Yes Kuneff, Renee A, DO  bimatoprost (LUMIGAN) 0.03 % ophthalmic solution 1 drop at bedtime.   Yes [provider]  brimonidine-timolol (COMBIGAN) 0.2-0.5 % ophthalmic solution Place 1 drop into both eyes every 12 (twelve) hours.   Yes [provider]  brinzolamide (AZOPT) 1 % ophthalmic suspension Place 1 drop into the right eye 2 (two) times daily.   Yes [provider]  candesartan-hydrochlorothiazide (ATACAND HCT) 16-12.5 MG tablet Take 1 tablet by mouth daily. 06/12/21  Yes Kuneff, Renee A, DO  Coenzyme Q10 (CO Q-10) 100 MG CAPS  Yes [provider]  Docusate Sodium (DSS) 100 MG CAPS Take by mouth.   Yes [provider]  DULoxetine (CYMBALTA) 30 MG capsule Take 3 capsules (90 mg total) by mouth daily. 06/12/21  Yes Kuneff, Renee A, DO  levothyroxine (SYNTHROID) 150 MCG tablet Take 1 tablet (150 mcg total) by mouth daily. 03/03/21  Yes Kuneff, Renee A, DO  metoprolol succinate (TOPROL-XL) 25 MG 24 hr tablet Take 1 tablet (25 mg total) by mouth daily. 03/03/21  Yes Kuneff, Renee A, DO  Multiple Vitamin (MULTIVITAMIN) capsule Take 1 capsule by mouth daily.   Yes [provider]  Olopatadine HCl  (PATADAY OP) Apply to eye.   Yes [provider]  pantoprazole (PROTONIX) 40 MG tablet Take 1 tablet (40 mg total) by mouth daily. 06/12/21  Yes Kuneff, Renee A, DO  polyethylene glycol powder (GLYCOLAX/MIRALAX) 17 GM/SCOOP powder Take by mouth. 10/03/19  Yes [provider]  pregabalin (LYRICA) 150 MG capsule Take 1 capsule (150 mg total) by mouth 2 (two) times daily. 03/04/21  Yes Kuneff, Renee A, DO  Semaglutide,0.25 or 0.5MG /DOS, (OZEMPIC, 0.25 OR 0.5 MG/DOSE,) 2 MG/1.5ML SOPN Inject 0.5 mg as directed daily. 06/12/21  Yes Kuneff, Renee A, DO  acetaminophen (TYLENOL) 650 MG CR tablet Take by mouth. 10/03/19   [provider]  clopidogrel (PLAVIX) 75 MG tablet Take 1 tablet (75 mg total) by mouth daily. 06/12/21   Kuneff, Renee A, DO  fluticasone (FLONASE) 50 MCG/ACT nasal spray Place into both nostrils. 01/04/21   [provider]  nitroGLYCERIN (NITROSTAT) 0.4 MG SL tablet Place under the tongue. 05/27/20   [provider]    Current Outpatient Medications  Medication Sig Dispense Refill   allopurinol (ZYLOPRIM) 300 MG tablet Take 1 tablet (300 mg total) by mouth daily. 90 tablet 1   Alpha-Lipoic Acid 300 MG CAPS Take by mouth 2 (two) times daily.     aspirin 81 MG chewable tablet Chew by mouth daily.     atorvastatin (LIPITOR) 80 MG tablet Take 1 tablet (80 mg total) by mouth daily. 90 tablet 3   bimatoprost (LUMIGAN) 0.03 % ophthalmic solution 1 drop at bedtime.     brimonidine-timolol (COMBIGAN) 0.2-0.5 % ophthalmic solution Place 1 drop into both eyes every 12 (twelve) hours.     brinzolamide (AZOPT) 1 % ophthalmic suspension Place 1 drop into the right eye 2 (two) times daily.     candesartan-hydrochlorothiazide (ATACAND HCT) 16-12.5 MG tablet Take 1 tablet by mouth daily. 90 tablet 1   Coenzyme Q10 (CO Q-10) 100 MG CAPS      Docusate Sodium (DSS) 100 MG CAPS Take by mouth.     DULoxetine (CYMBALTA) 30 MG capsule Take 3 capsules (90 mg total) by mouth  daily. 270 capsule 1   levothyroxine (SYNTHROID) 150 MCG tablet Take 1 tablet (150 mcg total) by mouth daily. 90 tablet 3   metoprolol succinate (TOPROL-XL) 25 MG 24 hr tablet Take 1 tablet (25 mg total) by mouth daily. 90 tablet 1   Multiple Vitamin (MULTIVITAMIN) capsule Take 1 capsule by mouth daily.     Olopatadine HCl (PATADAY OP) Apply to eye.     pantoprazole (PROTONIX) 40 MG tablet Take 1 tablet (40 mg total) by mouth daily. 90 tablet 1   polyethylene glycol powder (GLYCOLAX/MIRALAX) 17 GM/SCOOP powder Take by mouth.     pregabalin (LYRICA) 150 MG capsule Take 1 capsule (150 mg total) by mouth 2 (two) times daily. 180 capsule 1  Semaglutide,0.25 or 0.5MG /DOS, (OZEMPIC, 0.25 OR 0.5 MG/DOSE,) 2 MG/1.5ML SOPN Inject 0.5 mg as directed daily. 6 mL 2   acetaminophen (TYLENOL) 650 MG CR tablet Take by mouth.     clopidogrel (PLAVIX) 75 MG tablet Take 1 tablet (75 mg total) by mouth daily. 90 tablet 3   fluticasone (FLONASE) 50 MCG/ACT nasal spray Place into both nostrils.     nitroGLYCERIN (NITROSTAT) 0.4 MG SL tablet Place under the tongue.     Current Facility-Administered Medications  Medication Dose Route Frequency Provider Last Rate Last Admin   0.9 %  sodium chloride infusion  500 mL Intravenous Continuous Sharyn Creamer, MD        Allergies as of 08/13/2021 - Review Complete 08/13/2021  Allergen Reaction Noted   Celebrex [celecoxib] Swelling 09/21/2016   Iodine Nausea Only 09/21/2016    Family History  Problem Relation Age of Onset   Depression Mother    Diabetes Mother    Hyperlipidemia Mother    Hypertension Mother    Hypertension Father    Depression Sister    Hyperlipidemia Sister    Hypertension Sister    Alcohol abuse Brother    Diabetes Brother    Heart attack Brother    Heart disease Brother    Hyperlipidemia Brother    Hypertension Brother    Breast cancer Maternal Grandmother    Kidney disease Maternal Grandfather    Stroke Paternal Grandmother     Stroke Paternal Grandfather    Heart disease Brother    Hyperlipidemia Brother    Hypertension Brother     Social History   Socioeconomic History   Marital status: Married    Spouse name: Not on file   Number of children: Not on file   Years of education: Not on file   Highest education level: Not on file  Occupational History   Not on file  Tobacco Use   Smoking status: Former    Packs/day: 3.00    Years: 5.00    Pack years: 15.00    Types: Cigarettes    Quit date: 02/11/1980    Years since quitting: 41.5   Smokeless tobacco: Never  Vaping Use   Vaping Use: Never used  Substance and Sexual Activity   Alcohol use: Yes    Alcohol/week: 2.0 standard drinks    Types: 2 Cans of beer per week   Drug use: No   Sexual activity: Not Currently  Other Topics Concern   Not on file  Social History Narrative   Marital status/children/pets: Married.   Education/employment: Masters degree.  Retired from [employer]   Safety:      -smoke alarm in the home:Yes     - wears seatbelt: Yes      Social Determinants of Health   Financial Resource Strain: Low Risk    Difficulty of Paying Living Expenses: Not hard at all  Food Insecurity: No Food Insecurity   Worried About Charity fundraiser in the Last Year: Never true   Ran Out of Food in the Last Year: Never true  Transportation Needs: No Transportation Needs   Lack of Transportation (Medical): No   Lack of Transportation (Non-Medical): No  Physical Activity: Sufficiently Active   Days of Exercise per Week: 4 days   Minutes of Exercise per Session: 40 min  Stress: No Stress Concern Present   Feeling of Stress : Only a little  Social Connections: Moderately Isolated   Frequency of Communication with Friends and Family:  Three times a week   Frequency of Social Gatherings with Friends and Family: More than three times a week   Attends Religious Services: Never   Marine scientist or Organizations: No    Attends Music therapist: Never   Marital Status: Married  Human resources officer Violence: Not At Risk   Fear of Current or Ex-Partner: No   Emotionally Abused: No   Physically Abused: No   Sexually Abused: No    Physical Exam: Vital signs in last 24 hours: BP 110/71   Pulse 73   Temp (!) 97.5 F (36.4 C) (Temporal)   Ht 5\' 8"  (1.727 m)   Wt 273 lb (123.8 kg)   SpO2 96%   BMI 41.51 kg/m  GEN: NAD EYE: Sclerae anicteric ENT: MMM CV: Non-tachycardic Pulm: No increased WOB GI: Soft NEURO:  Alert & Oriented   [name], MD [facility]   08/13/2021 3:25 PM

## 2021-08-13 NOTE — Op Note (Signed)
[place] Patient Name: Juan Stanton Procedure Date: 08/13/2021 3:24 PM MRN: [id number] Endoscopist: Sonny Masters "Juan Stanton ,  Age: 75 Referring MD:  Date of Birth: 25-May-1946 Gender: Male Account #: [id number] Procedure:                Colonoscopy Indications:              High risk colon cancer surveillance: Personal                            history of colonic polyps Medicines:                Monitored Anesthesia Care Procedure:                Pre-Anesthesia Assessment:                           - Prior to the procedure, a History and Physical                            was performed, and patient medications and                            allergies were reviewed. The patient's tolerance of                            previous anesthesia was also reviewed. The risks                            and benefits of the procedure and the sedation                            options and risks were discussed with the patient.                            All questions were answered, and informed consent                            was obtained. Prior Anticoagulants: The patient has                            taken Plavix (clopidogrel), last dose was 5 days                            prior to procedure. ASA Grade Assessment: III - A                            patient with severe systemic disease. After                            reviewing the risks and benefits, the patient was                            deemed in satisfactory condition to undergo the  procedure.                           After obtaining informed consent, the colonoscope                            was passed under direct vision. Throughout the                            procedure, the patient's blood pressure, pulse, and                            oxygen saturations were monitored continuously. The                            Olympus CF-HQ190L [id number]) Colonoscope was                             introduced through the anus and advanced to the the                            cecum, identified by the ileocecal valve. The                            colonoscopy was performed without difficulty. The                            patient tolerated the procedure well. The quality                            of the bowel preparation was inadequate. Scope In: 3:51:17 PM Scope Out: 4:07:27 PM Scope Withdrawal Time: 0 hours 12 minutes 13 seconds  Total Procedure Duration: 0 hours 16 minutes 10 seconds  Findings:                 Stool was found in the entire colon, interfering                            with visualization.                           A few diverticula were found in the sigmoid colon.                           Non-bleeding internal hemorrhoids were found during                            retroflexion. Complications:            No immediate complications. Estimated Blood Loss:     Estimated blood loss: none. Impression:               - Preparation of the colon was inadequate.                           - Stool in the entire examined colon.                           -  Diverticulosis in the sigmoid colon.                           - Non-bleeding internal hemorrhoids.                           - No specimens collected. Recommendation:           - Discharge patient to home (with escort).                           - Repeat colonoscopy at appointment to be scheduled                            because the bowel preparation was suboptimal.                           - The findings and recommendations were discussed                            with the patient. [address]Christia Stanton,  08/13/2021 4:18:44 PM

## 2021-08-13 NOTE — Progress Notes (Signed)
Called to room to assist during endoscopic procedure.  Patient ID and intended procedure confirmed with present staff. Received instructions for my participation in the procedure from the performing physician.  

## 2021-08-13 NOTE — Op Note (Addendum)
[place] Patient Name: Juan Stanton Procedure Date: 08/13/2021 3:30 PM MRN: [id number] Endoscopist: Sonny Masters "Juan Stanton ,  Age: 75 Referring MD:  Date of Birth: [birth year] Gender: Male Account #: [id number] Procedure:                Upper GI endoscopy Indications:              Heartburn, Screening for Barrett's esophagus Medicines:                Monitored Anesthesia Care Procedure:                Pre-Anesthesia Assessment:                           - Prior to the procedure, a History and Physical                            was performed, and patient medications and                            allergies were reviewed. The patient's tolerance of                            previous anesthesia was also reviewed. The risks                            and benefits of the procedure and the sedation                            options and risks were discussed with the patient.                            All questions were answered, and informed consent                            was obtained. Prior Anticoagulants: The patient has                            taken Plavix (clopidogrel), last dose was 5 days                            prior to procedure. ASA Grade Assessment: III - A                            patient with severe systemic disease. After                            reviewing the risks and benefits, the patient was                            deemed in satisfactory condition to undergo the                            procedure.  After obtaining informed consent, the endoscope was                            passed under direct vision. Throughout the                            procedure, the patient's blood pressure, pulse, and                            oxygen saturations were monitored continuously. The                            Endoscope was introduced through the mouth, and                            advanced to the second part of duodenum.  The upper                            GI endoscopy was accomplished without difficulty.                            The patient tolerated the procedure well. Scope In: Scope Out: Findings:                 The examined esophagus was normal.                           Localized inflammation characterized by congestion                            (edema) and erythema was found in the gastric body                            and in the gastric antrum. Biopsies were taken with                            a cold forceps for Helicobacter pylori testing.                           Multiple 5 to 10 mm pedunculated and sessile polyps                            with no bleeding and no stigmata of recent bleeding                            were found in the gastric body. Biopsies were taken                            with a cold forceps for histology.                           The examined duodenum was normal. Complications:            No immediate complications. Estimated Blood Loss:  Estimated blood loss was minimal. Impression:               - Normal esophagus.                           - Gastritis. Biopsied.                           - Multiple gastric polyps. Biopsied.                           - Normal examined duodenum. Recommendation:           - Await pathology results.                           - Perform a colonoscopy today. Sonny Masters "Juan Stanton,  08/13/2021 4:13:49 PM

## 2021-08-13 NOTE — Progress Notes (Signed)
Pt's states no medical or surgical changes since previsit or office visit. 

## 2021-08-13 NOTE — Patient Instructions (Signed)
YOU HAD AN ENDOSCOPIC PROCEDURE TODAY: Refer to the procedure report and other information in the discharge instructions given to you for any specific questions about what was found during the examination. If this information does not answer your questions, please call [place] office at [phone] to clarify.  ° °YOU SHOULD EXPECT: Some feelings of bloating in the abdomen. Passage of more gas than usual. Walking can help get rid of the air that was put into your GI tract during the procedure and reduce the bloating. If you had a lower endoscopy (such as a colonoscopy or flexible sigmoidoscopy) you may notice spotting of blood in your stool or on the toilet paper. Some abdominal soreness may be present for a day or two, also. ° °DIET: Your first meal following the procedure should be a light meal and then it is ok to progress to your normal diet. A half-sandwich or bowl of soup is an example of a good first meal. Heavy or fried foods are harder to digest and may make you feel nauseous or bloated. Drink plenty of fluids but you should avoid alcoholic beverages for 24 hours. If you had a esophageal dilation, please see attached instructions for diet.   ° °ACTIVITY: Your care partner should take you home directly after the procedure. You should plan to take it easy, moving slowly for the rest of the day. You can resume normal activity the day after the procedure however YOU SHOULD NOT DRIVE, use power tools, machinery or perform tasks that involve climbing or major physical exertion for 24 hours (because of the sedation medicines used during the test).  ° °SYMPTOMS TO REPORT IMMEDIATELY: °A gastroenterologist can be reached at any hour. Please call [phone]  for any of the following symptoms:  °Following lower endoscopy (colonoscopy, flexible sigmoidoscopy) °Excessive amounts of blood in the stool  °Significant tenderness, worsening of abdominal pains  °Swelling of the abdomen that is new, acute  °Fever of 100° or  higher  °Following upper endoscopy (EGD, EUS, ERCP, esophageal dilation) °Vomiting of blood or coffee ground material  °New, significant abdominal pain  °New, significant chest pain or pain under the shoulder blades  °Painful or persistently difficult swallowing  °New shortness of breath  °Black, tarry-looking or red, bloody stools ° °FOLLOW UP:  °If any biopsies were taken you will be contacted by phone or by letter within the next 1-3 weeks. Call [phone]  if you have not heard about the biopsies in 3 weeks.  °Please also call with any specific questions about appointments or follow up tests. ° °

## 2021-08-13 NOTE — Progress Notes (Signed)
Patient had poor colon prep. Colonoscopy rescheduled for tomorrow. Patient and spouse received colon prep instructions and both verbalized understanding.

## 2021-08-13 NOTE — Progress Notes (Signed)
Report to PACU, RN, vss, BBS= Clear.  

## 2021-08-14 ENCOUNTER — Other Ambulatory Visit: Payer: Self-pay

## 2021-08-14 ENCOUNTER — Encounter: Payer: Self-pay | Admitting: Gastroenterology

## 2021-08-14 ENCOUNTER — Ambulatory Visit: Payer: Medicare HMO | Admitting: Gastroenterology

## 2021-08-14 VITALS — BP 128/71 | HR 61 | Temp 98.4°F | Resp 13 | Ht 68.0 in | Wt 273.0 lb

## 2021-08-14 DIAGNOSIS — Z86010 Personal history of colonic polyps: Secondary | ICD-10-CM

## 2021-08-14 DIAGNOSIS — D123 Benign neoplasm of transverse colon: Secondary | ICD-10-CM

## 2021-08-14 MED ORDER — SODIUM CHLORIDE 0.9 % IV SOLN: 500.0000 mL | Freq: Once | INTRAVENOUS | Status: DC

## 2021-08-14 NOTE — Progress Notes (Signed)
Pt's states no medical or surgical changes since visit yesterday, had colon sched yesterday and was not cleaned out, resched colon today  Vitals DT

## 2021-08-14 NOTE — Progress Notes (Signed)
History and Physical Interval Note:  08/14/2021 9:31 AM  Juan Stanton  has presented today for endoscopic procedure(s), with the diagnosis of  Encounter Diagnosis  Name Primary?   Personal history of colonic polyps Yes  .  The various methods of evaluation and treatment have been discussed with the patient and/or family. After consideration of risks, benefits and other options for treatment, the patient has consented to  the endoscopic procedure(s).   The patient's history has been reviewed, patient examined, no change in status, stable for endoscopic procedure(s).  I have reviewed the patient's chart and labs.  Questions were answered to the patient's satisfaction.    Patient underwent EGD/Colonoscopy yesterday but bowel prep was inadequate.  Repeat attempt at colonoscopy today.  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  No fam hx of CRC.  Juan Stanton E. Candis Schatz, MD [facility]

## 2021-08-14 NOTE — Progress Notes (Signed)
Called to room to assist during endoscopic procedure.  Patient ID and intended procedure confirmed with present staff. Received instructions for my participation in the procedure from the performing physician.  

## 2021-08-14 NOTE — Progress Notes (Signed)
To PACU, VSS. Report to [email or web address] 

## 2021-08-14 NOTE — Patient Instructions (Addendum)
Handouts on hemorrhoids, diverticulosis, and polyps given to patient. Await pathology results. Repeat colonoscopy for screening purposes in 3 years. Recommend a 2-day bowel prep for next colonoscopy Resume Plavix (Clopidogrel) at prior dose tomorrow 08/15/21 Resume previous diet and continue present medications.   YOU HAD AN ENDOSCOPIC PROCEDURE TODAY AT [facility]:   Refer to the procedure report that was given to you for any specific questions about what was found during the examination.  If the procedure report does not answer your questions, please call your gastroenterologist to clarify.  If you requested that your care partner not be given the details of your procedure findings, then the procedure report has been included in a sealed envelope for you to review at your convenience later.  YOU SHOULD EXPECT: Some feelings of bloating in the abdomen. Passage of more gas than usual.  Walking can help get rid of the air that was put into your GI tract during the procedure and reduce the bloating. If you had a lower endoscopy (such as a colonoscopy or flexible sigmoidoscopy) you may notice spotting of blood in your stool or on the toilet paper. If you underwent a bowel prep for your procedure, you may not have a normal bowel movement for a few days.  Please Note:  You might notice some irritation and congestion in your nose or some drainage.  This is from the oxygen used during your procedure.  There is no need for concern and it should clear up in a day or so.  SYMPTOMS TO REPORT IMMEDIATELY:  Following lower endoscopy (colonoscopy or flexible sigmoidoscopy):  Excessive amounts of blood in the stool  Significant tenderness or worsening of abdominal pains  Swelling of the abdomen that is new, acute  Fever of 100F or higher  For urgent or emergent issues, a gastroenterologist can be reached at any hour by calling [phone]. Do not use MyChart messaging for urgent  concerns.    DIET:  We do recommend a small meal at first, but then you may proceed to your regular diet.  Drink plenty of fluids but you should avoid alcoholic beverages for 24 hours.  ACTIVITY:  You should plan to take it easy for the rest of today and you should NOT DRIVE or use heavy machinery until tomorrow (because of the sedation medicines used during the test).    FOLLOW UP: Our staff will call the number listed on your records 48-72 hours following your procedure to check on you and address any questions or concerns that you may have regarding the information given to you following your procedure. If we do not reach you, we will leave a message.  We will attempt to reach you two times.  During this call, we will ask if you have developed any symptoms of COVID 19. If you develop any symptoms (ie: fever, flu-like symptoms, shortness of breath, cough etc.) before then, please call [phone].  If you test positive for Covid 19 in the 2 weeks post procedure, please call and report this information to [place].    If any biopsies were taken you will be contacted by phone or by letter within the next 1-3 weeks.  Please call us at [phone] if you have not heard about the biopsies in 3 weeks.    SIGNATURES/CONFIDENTIALITY: You and/or your care partner have signed paperwork which will be entered into your electronic medical record.  These signatures attest to the fact that that the information above on your  After Visit Summary has been reviewed and is understood.  Full responsibility of the confidentiality of this discharge information lies with you and/or your care-partner.

## 2021-08-14 NOTE — Op Note (Signed)
[place] Patient Name: Juan Stanton Procedure Date: 08/14/2021 9:30 AM MRN: [id number] Endoscopist: Nicki Reaper E. Candis Schatz , MD Age: 75 Referring MD:  Date of Birth: 09/09/1946 Gender: Male Account #: [id number] Procedure:                Colonoscopy Indications:              High risk colon cancer surveillance: Personal                            history of adenoma with high grade dysplasia Medicines:                Monitored Anesthesia Care Procedure:                Pre-Anesthesia Assessment:                           - Prior to the procedure, a History and Physical                            was performed, and patient medications and                            allergies were reviewed. The patient's tolerance of                            previous anesthesia was also reviewed. The risks                            and benefits of the procedure and the sedation                            options and risks were discussed with the patient.                            All questions were answered, and informed consent                            was obtained. Prior Anticoagulants: The patient has                            taken Plavix (clopidogrel), last dose was 7 days                            prior to procedure. ASA Grade Assessment: III - A                            patient with severe systemic disease. After                            reviewing the risks and benefits, the patient was                            deemed in satisfactory condition to undergo the  procedure.                           After obtaining informed consent, the colonoscope                            was passed under direct vision. Throughout the                            procedure, the patient's blood pressure, pulse, and                            oxygen saturations were monitored continuously. The                            CF [id number] #[id number] was introduced through the  anus                            and advanced to the the terminal ileum, with                            identification of the appendiceal orifice and IC                            valve. The colonoscopy was somewhat difficult due                            to a redundant colon and significant looping.                            Successful completion of the procedure was aided by                            using manual pressure. The patient tolerated the                            procedure well. The quality of the bowel                            preparation was adequate. The terminal ileum,                            ileocecal valve, appendiceal orifice, and rectum                            were photographed. The bowel preparation used was                            Miralax via split dose instruction. Scope In: 9:45:35 AM Scope Out: 10:13:43 AM Scope Withdrawal Time: 0 hours 17 minutes 56 seconds  Total Procedure Duration: 0 hours 28 minutes 8 seconds  Findings:                 The perianal and digital rectal examinations were  normal. Pertinent negatives include normal                            sphincter tone and no palpable rectal lesions.                           A 10 mm polyp was found in the splenic flexure. The                            polyp was sessile. The polyp was removed with a                            cold snare. Resection and retrieval were complete.                            Estimated blood loss was minimal.                           Multiple small and large-mouthed diverticula were                            found in the sigmoid colon and descending colon.                           The exam was otherwise normal throughout the                            examined colon.                           The terminal ileum appeared normal.                           Non-bleeding internal hemorrhoids were found during                             retroflexion. The hemorrhoids were Grade I                            (internal hemorrhoids that do not prolapse).                           No additional abnormalities were found on                            retroflexion. Complications:            No immediate complications. Estimated Blood Loss:     Estimated blood loss was minimal. Impression:               - One 10 mm polyp at the splenic flexure, removed                            with a cold snare. Resected and retrieved.                           -  Diverticulosis in the sigmoid colon and in the                            descending colon.                           - The examined portion of the ileum was normal.                           - Non-bleeding internal hemorrhoids. Recommendation:           - Patient has a contact number available for                            emergencies. The signs and symptoms of potential                            delayed complications were discussed with the                            patient. Return to normal activities tomorrow.                            Written discharge instructions were provided to the                            patient.                           - Resume previous diet.                           - Resume Plavix (clopidogrel) at prior dose                            tomorrow.                           - Await pathology results.                           - Repeat colonoscopy in 3 years for surveillance.                           - Recommend 2 day bowel prep for next colonoscopy Azyriah Nevins E. Candis Schatz, MD 08/14/2021 10:20:53 AM This report has been signed electronically.

## 2021-08-18 ENCOUNTER — Telehealth: Payer: Self-pay | Admitting: *Deleted

## 2021-08-18 ENCOUNTER — Telehealth: Payer: Self-pay

## 2021-08-18 NOTE — Telephone Encounter (Signed)
Attempted to call patient for their post-procedure follow-up call. No answer. Left voicemail.   

## 2021-08-18 NOTE — Telephone Encounter (Signed)
Called [phone] and left a message we tried to reach pt for a follow up call. maw

## 2021-08-19 ENCOUNTER — Encounter: Payer: Self-pay | Admitting: Internal Medicine

## 2021-08-22 NOTE — Progress Notes (Signed)
Juan Stanton,  The polyp that I removed during your recent procedure was completely benign but was proven to be a "pre-cancerous" polyp that MAY have grown into cancers if it had not been removed.  Studies shows that at least 20% of women over age 73 and 30% of men over age 67 have pre-cancerous polyps.  Based on current nationally recognized surveillance guidelines, I recommend that you have a repeat colonoscopy in 3 years.   If you develop any new rectal bleeding, abdominal pain or significant bowel habit changes, please contact me before then.

## 2021-09-29 ENCOUNTER — Encounter: Payer: Self-pay | Admitting: Podiatry

## 2021-09-29 ENCOUNTER — Other Ambulatory Visit: Payer: Self-pay

## 2021-09-29 ENCOUNTER — Ambulatory Visit: Payer: Medicare HMO | Admitting: Podiatry

## 2021-09-29 DIAGNOSIS — M79674 Pain in right toe(s): Secondary | ICD-10-CM | POA: Diagnosis not present

## 2021-09-29 DIAGNOSIS — M79675 Pain in left toe(s): Secondary | ICD-10-CM

## 2021-09-29 DIAGNOSIS — B351 Tinea unguium: Secondary | ICD-10-CM

## 2021-09-29 NOTE — Progress Notes (Signed)
°  Subjective:  Patient ID: Juan Stanton, male    DOB: July 26, 1946,  MRN: [id number]  Chief Complaint  Patient presents with   Nail Problem      3 month follow up for at risk diabetic foot care    76 y.o. male returns with the above complaint. History confirmed with patient.  Doing well, no new issues Objective:  Physical Exam: warm, good capillary refill, no trophic changes or ulcerative lesions and normal DP and PT pulses.  Absent light touch sensation and protective sensation distally.  He has nail dystrophy and residual onychomycosis of all nails.  Callus present bilateral hallux  Assessment:   1. Pain due to onychomycosis of toenails of both feet       Plan:  Patient was evaluated and treated and all questions answered.  Discussed the etiology and treatment options for the condition in detail with the patient. Educated patient on the topical and oral treatment options for mycotic nails. Recommended debridement of the nails today. Sharp and mechanical debridement performed of all painful and mycotic nails today. Nails debrided in length and thickness using a nail nipper. Discussed treatment options including appropriate shoe gear. Follow up as needed for painful nails.  All symptomatic hyperkeratoses were safely debrided with a sterile #15 blade to patient's level of comfort without incident. We discussed preventative and palliative care of these lesions including supportive and accommodative shoegear, padding, prefabricated and custom molded accommodative orthoses, use of a pumice stone and lotions/creams daily.    Return in about 3 months (around 12/28/2021) for at risk diabetic foot care.

## 2021-10-17 ENCOUNTER — Other Ambulatory Visit: Payer: Self-pay | Admitting: Family Medicine

## 2021-10-19 ENCOUNTER — Other Ambulatory Visit: Payer: Self-pay | Admitting: Family Medicine

## 2021-10-21 ENCOUNTER — Ambulatory Visit (INDEPENDENT_AMBULATORY_CARE_PROVIDER_SITE_OTHER): Payer: Medicare HMO | Admitting: Family Medicine

## 2021-10-21 ENCOUNTER — Encounter: Payer: Self-pay | Admitting: Family Medicine

## 2021-10-21 ENCOUNTER — Other Ambulatory Visit: Payer: Self-pay

## 2021-10-21 VITALS — BP 111/74 | HR 71 | Temp 97.8°F | Ht 68.0 in | Wt 273.0 lb

## 2021-10-21 DIAGNOSIS — G609 Hereditary and idiopathic neuropathy, unspecified: Secondary | ICD-10-CM | POA: Diagnosis not present

## 2021-10-21 DIAGNOSIS — M1A9XX0 Chronic gout, unspecified, without tophus (tophi): Secondary | ICD-10-CM

## 2021-10-21 DIAGNOSIS — I739 Peripheral vascular disease, unspecified: Secondary | ICD-10-CM

## 2021-10-21 DIAGNOSIS — M48061 Spinal stenosis, lumbar region without neurogenic claudication: Secondary | ICD-10-CM

## 2021-10-21 DIAGNOSIS — I2510 Atherosclerotic heart disease of native coronary artery without angina pectoris: Secondary | ICD-10-CM | POA: Diagnosis not present

## 2021-10-21 DIAGNOSIS — I10 Essential (primary) hypertension: Secondary | ICD-10-CM

## 2021-10-21 DIAGNOSIS — K219 Gastro-esophageal reflux disease without esophagitis: Secondary | ICD-10-CM

## 2021-10-21 DIAGNOSIS — E785 Hyperlipidemia, unspecified: Secondary | ICD-10-CM

## 2021-10-21 DIAGNOSIS — Z6839 Body mass index (BMI) 39.0-39.9, adult: Secondary | ICD-10-CM

## 2021-10-21 DIAGNOSIS — Z955 Presence of coronary angioplasty implant and graft: Secondary | ICD-10-CM

## 2021-10-21 DIAGNOSIS — E1169 Type 2 diabetes mellitus with other specified complication: Secondary | ICD-10-CM

## 2021-10-21 DIAGNOSIS — E039 Hypothyroidism, unspecified: Secondary | ICD-10-CM

## 2021-10-21 DIAGNOSIS — I252 Old myocardial infarction: Secondary | ICD-10-CM

## 2021-10-21 DIAGNOSIS — I700 Atherosclerosis of aorta: Secondary | ICD-10-CM

## 2021-10-21 DIAGNOSIS — D6869 Other thrombophilia: Secondary | ICD-10-CM

## 2021-10-21 DIAGNOSIS — Z8249 Family history of ischemic heart disease and other diseases of the circulatory system: Secondary | ICD-10-CM

## 2021-10-21 DIAGNOSIS — G4733 Obstructive sleep apnea (adult) (pediatric): Secondary | ICD-10-CM

## 2021-10-21 LAB — POCT GLYCOSYLATED HEMOGLOBIN (HGB A1C)
HbA1c POC (<> result, manual entry): 5.8 % (ref 4.0–5.6)
HbA1c, POC (controlled diabetic range): 5.8 % (ref 0.0–7.0)
HbA1c, POC (prediabetic range): 5.8 % (ref 5.7–6.4)
Hemoglobin A1C: 5.8 % — AB (ref 4.0–5.6)

## 2021-10-21 MED ORDER — LEVOTHYROXINE SODIUM 150 MCG PO TABS: 150.0000 ug | ORAL_TABLET | Freq: Every day | ORAL | 3 refills | Status: DC

## 2021-10-21 MED ORDER — METOPROLOL SUCCINATE ER 25 MG PO TB24: 12.5000 mg | ORAL_TABLET | Freq: Every day | ORAL | 1 refills | Status: DC

## 2021-10-21 MED ORDER — PREGABALIN 150 MG PO CAPS: 150.0000 mg | ORAL_CAPSULE | Freq: Two times a day (BID) | ORAL | 0 refills | Status: DC

## 2021-10-21 MED ORDER — ATORVASTATIN CALCIUM 80 MG PO TABS: 80.0000 mg | ORAL_TABLET | Freq: Every day | ORAL | 3 refills | Status: DC

## 2021-10-21 MED ORDER — ALLOPURINOL 300 MG PO TABS: 300.0000 mg | ORAL_TABLET | Freq: Every day | ORAL | 1 refills | Status: DC

## 2021-10-21 MED ORDER — DULOXETINE HCL 60 MG PO CPEP: 60.0000 mg | ORAL_CAPSULE | Freq: Every day | ORAL | 1 refills | Status: DC

## 2021-10-21 MED ORDER — OZEMPIC (0.25 OR 0.5 MG/DOSE) 2 MG/1.5ML ~~LOC~~ SOPN: 0.5000 mg | PEN_INJECTOR | Freq: Every day | SUBCUTANEOUS | 2 refills | Status: DC

## 2021-10-21 MED ORDER — CANDESARTAN CILEXETIL-HCTZ 16-12.5 MG PO TABS: 1.0000 | ORAL_TABLET | Freq: Every day | ORAL | 1 refills | Status: DC

## 2021-10-21 MED ORDER — PREGABALIN 150 MG PO CAPS: 150.0000 mg | ORAL_CAPSULE | Freq: Two times a day (BID) | ORAL | 1 refills | Status: DC

## 2021-10-21 MED ORDER — PANTOPRAZOLE SODIUM 40 MG PO TBEC: 40.0000 mg | DELAYED_RELEASE_TABLET | Freq: Every day | ORAL | 1 refills | Status: DC

## 2021-10-21 NOTE — Patient Instructions (Signed)
Great to see you today.  I have refilled the medication(s) we provide.   If labs were collected, we will inform you of lab results once received either by echart message or telephone call.   - echart message- for normal results that have been seen by the patient already.   - telephone call: abnormal results or if patient has not viewed results in their echart.  

## 2021-10-21 NOTE — Progress Notes (Signed)
Patient ID: Juan Stanton, male  DOB: 1945-09-29, 76 y.o.   MRN: [id number] Patient Care Team    Relationship Specialty Notifications Start End  Ma Hillock, DO PCP - General Family Medicine  03/03/21   Sharyne Peach, MD Consulting Physician Ophthalmology  03/02/21   Alfonso Patten, MD Referring Physician Cardiology  03/02/21   Ulla Gallo, MD Consulting Physician Dermatology  03/02/21   Criselda Peaches, DPM Consulting Physician Podiatry  03/02/21   Elmarie Mainland, MD Consulting Physician Sleep Medicine  03/02/21     Chief Complaint  Patient presents with   Peripheral Neuropathy    CMC; pt is not fasting    Subjective: Juan Stanton is a 76 y.o.  male present for [facility] Gastroesophageal reflux disease, unspecified whether esophagitis present Patient reports condition is stable as long as he remains on Protonix 40 mg daily.  Patient is compliant.  No complaints  Acquired hypothyroidism Patient reports compliance with 150 mg of levothyroxine daily.  Labs up-to-date June 2022.  Obstructive sleep apnea (adult) (pediatric) Managed by pulmonology.  Spinal stenosis of lumbar region without neurogenic claudication/Peripheral neuropathy, hereditary/idiopathic Patient reports he is unsure if [facility] has been sending him the 100 or the 150 mg twice daily dosing.  He will check once he gets home.  He would like to take the 150 Lyrica twice daily.  He has continued the B12.  B12 responded well per lab.  Prior note He reports he has neuropathy of both his distal lower extremities and distal upper extremities.  He states he has been worked up by neurology and there is an unknown cause of his neuropathy.  He has been prescribed Cymbalta 90 mg daily and Lyrica 100 mg twice daily.  He states these are both very helpful, however he believes his neuropathy has been worsening over the last few months.  Essential (primary) hypertension/Mixed hyperlipidemia/presence of coronary angioplasty  implant and graft/Atherosclerosis of aorta (HCC)/Family history of heart disease/PAD (peripheral artery disease) (HCC)/Morbid obesity (HCC)-BMI 39.0-39.9,adult/Atherosclerosis of native coronary artery of native heart without angina pectoris/Acquired thrombophilia ([place]), chronic anticoag Pt reports compliance with metoprolol XL (12.5 mg) 25 mg daily, aspirin/Plavix/statin, candesartan-HCTZ 16-12.5 mg daily. Blood pressures ranges at home within normal limits. Patient denies chest pain, shortness of breath, dizziness or lower extremity edema.  He had his cardiology follow-up this week and EKG was reported as normal. Pt is  prescribed statin.  Patient is on chronic anticoagulation with aspirin/Plavix  Diabetes/obesity Pt reports compliance with Ozempic 0.5 mg weekly.  He states he is tolerating the medicine well.Patient denies dizziness, hyperglycemic or hypoglycemic events. Patient denies numbness, tingling in the extremities or nonhealing wounds of feet.   BP 111/74    Pulse 71    Temp 97.8 F (36.6 C) (Oral)    Ht 5\' 8"  (1.727 m)    Wt 273 lb (123.8 kg)    SpO2 95%    BMI 41.51 kg/m   Physical Exam Vitals and nursing note reviewed.  Constitutional:      General: He is not in acute distress.    Appearance: Normal appearance. He is obese. He is not ill-appearing, toxic-appearing or diaphoretic.  HENT:     Head: Normocephalic and atraumatic.  Eyes:     General: No scleral icterus.       Right eye: No discharge.        Left eye: No discharge.     Extraocular Movements: Extraocular movements intact.  Pupils: Pupils are equal, round, and reactive to light.  Cardiovascular:     Rate and Rhythm: Normal rate and regular rhythm.     Heart sounds: No murmur heard.   No friction rub. No gallop.  Pulmonary:     Effort: Pulmonary effort is normal. No respiratory distress.     Breath sounds: Normal breath sounds. No wheezing, rhonchi or rales.  Musculoskeletal:     Cervical back: Neck supple.      Right lower leg: No edema.     Left lower leg: No edema.  Lymphadenopathy:     Cervical: No cervical adenopathy.  Skin:    General: Skin is warm and dry.     Coloration: Skin is not jaundiced or pale.     Findings: No rash.  Neurological:     Mental Status: He is alert and oriented to person, place, and time. Mental status is at baseline.  Psychiatric:        Mood and Affect: Mood normal.        Behavior: Behavior normal.        Thought Content: Thought content normal.        Judgment: Judgment normal.    Assessment/plan: Juan Stanton is a 76 y.o. male present for  Gastroesophageal reflux disease, unspecified whether esophagitis present Stable.  Continue Protonix 40 mg daily  Acquired hypothyroidism Stable.  Continue levothyroxine 150 micrograms daily.    Obstructive sleep apnea (adult) (pediatric) Managed by pulmonology  Spinal stenosis of lumbar region without neurogenic claudication/Peripheral neuropathy, hereditary/idiopathic - Stable -Decrease per patient request Cymbalta 90 mg > 60 mg daily - Continue Lyrica to 150 mg twice daily. -Continue B12 level -labs up-to-date  Essential (primary) hypertension/Mixed hyperlipidemia/presence of coronary angioplasty implant and graft/Atherosclerosis of aorta (HCC)/Family history of heart disease/PAD (peripheral artery disease) (HCC)/Morbid obesity (HCC)-BMI 39.0-39.9,adult/Atherosclerosis of native coronary artery of native heart without angina pectoris/Acquired thrombophilia ([place]), chronic anticoag Stable Continue daily baby aspirin. Continue Plavix 75 mg daily Continue atorvastatin 80 mg daily. Continue candesartan/HCTZ 16-12.5 mg daily. 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 - Hemoglobin A1c> 5.5 >5.8 today Very tight control of his sugars are recommended given his cardiac conditions. Patient is established with  podiatry Yearly eye exams encouraged-completed 06/08/2021 Foot exam up-to-date 07/20/2021  Gout: Stable  Continue allopurinol 300 mg daily   Return in about 24 weeks (around 04/07/2022) for CMC (30 min).  Orders Placed This Encounter  Procedures   POCT HgB A1C   Meds ordered this encounter  Medications   pregabalin (LYRICA) 150 MG capsule    Sig: Take 1 capsule (150 mg total) by mouth 2 (two) times daily.    Dispense:  180 capsule    Refill:  1    DC other Lyrica scripts please.   pregabalin (LYRICA) 150 MG capsule    Sig: Take 1 capsule (150 mg total) by mouth 2 (two) times daily.    Dispense:  60 capsule    Refill:  0    Short term script until mail order arrives.   allopurinol (ZYLOPRIM) 300 MG tablet    Sig: Take 1 tablet (300 mg total) by mouth daily.    Dispense:  90 tablet    Refill:  1   atorvastatin (LIPITOR) 80 MG tablet    Sig: Take 1 tablet (80 mg total) by mouth daily.    Dispense:  90  tablet    Refill:  3   candesartan-hydrochlorothiazide (ATACAND HCT) 16-12.5 MG tablet    Sig: Take 1 tablet by mouth daily.    Dispense:  90 tablet    Refill:  1   levothyroxine (SYNTHROID) 150 MCG tablet    Sig: Take 1 tablet (150 mcg total) by mouth daily.    Dispense:  90 tablet    Refill:  3   metoprolol succinate (TOPROL-XL) 25 MG 24 hr tablet    Sig: Take 0.5 tablets (12.5 mg total) by mouth daily.    Dispense:  45 tablet    Refill:  1   pantoprazole (PROTONIX) 40 MG tablet    Sig: Take 1 tablet (40 mg total) by mouth daily.    Dispense:  90 tablet    Refill:  1   Semaglutide,0.25 or 0.5MG /DOS, (OZEMPIC, 0.25 OR 0.5 MG/DOSE,) 2 MG/1.5ML SOPN    Sig: Inject 0.5 mg as directed daily.    Dispense:  6 mL    Refill:  2   DULoxetine (CYMBALTA) 60 MG capsule    Sig: Take 1 capsule (60 mg total) by mouth daily.    Dispense:  90 capsule    Refill:  1    DC other scripts- dose change    Referral Orders  No referral(s) requested today     Note is dictated  utilizing voice recognition software. Although note has been proof read prior to signing, occasional typographical errors still can be missed. If any questions arise, please do not hesitate to call for verification.  Electronically signed by: Howard Pouch, DO [place]

## 2021-10-30 ENCOUNTER — Ambulatory Visit: Payer: Medicare HMO | Admitting: Family Medicine

## 2021-11-16 ENCOUNTER — Other Ambulatory Visit: Payer: Self-pay

## 2021-11-16 DIAGNOSIS — I252 Old myocardial infarction: Secondary | ICD-10-CM

## 2021-11-16 DIAGNOSIS — E1169 Type 2 diabetes mellitus with other specified complication: Secondary | ICD-10-CM

## 2021-11-16 DIAGNOSIS — I2510 Atherosclerotic heart disease of native coronary artery without angina pectoris: Secondary | ICD-10-CM

## 2021-11-16 DIAGNOSIS — Z955 Presence of coronary angioplasty implant and graft: Secondary | ICD-10-CM

## 2021-11-16 MED ORDER — OZEMPIC (0.25 OR 0.5 MG/DOSE) 2 MG/1.5ML ~~LOC~~ SOPN: 0.5000 mg | PEN_INJECTOR | SUBCUTANEOUS | 2 refills | Status: DC

## 2021-11-16 NOTE — Telephone Encounter (Signed)
Corrected prescription, weekly injections

## 2021-11-16 NOTE — Telephone Encounter (Signed)
Faxed received from pharmacy, [facility], stating that Semaglutide 0.5MG /DOS, ([place]) sig for exceeds max dose recommendations. Sig states daily but max is weekly. Please advise.

## 2021-11-20 ENCOUNTER — Ambulatory Visit: Payer: Medicare HMO | Admitting: Family Medicine

## 2021-12-01 ENCOUNTER — Other Ambulatory Visit: Payer: Self-pay | Admitting: Family Medicine

## 2021-12-01 MED ORDER — FLUTICASONE PROPIONATE 50 MCG/ACT NA SUSP: 1.0000 | Freq: Every day | NASAL | 11 refills | Status: DC

## 2021-12-01 NOTE — Telephone Encounter (Signed)
Not prescribed by you. Last seen 01/25. Please advise ?

## 2021-12-18 ENCOUNTER — Ambulatory Visit: Payer: Medicare HMO | Admitting: Family Medicine

## 2021-12-21 ENCOUNTER — Other Ambulatory Visit: Payer: Self-pay

## 2021-12-21 ENCOUNTER — Ambulatory Visit (INDEPENDENT_AMBULATORY_CARE_PROVIDER_SITE_OTHER): Payer: Medicare HMO | Admitting: Family Medicine

## 2021-12-21 ENCOUNTER — Ambulatory Visit: Payer: Medicare HMO | Admitting: Family Medicine

## 2021-12-21 ENCOUNTER — Encounter: Payer: Self-pay | Admitting: Family Medicine

## 2021-12-21 VITALS — BP 124/80 | HR 79 | Temp 98.1°F | Ht 68.0 in | Wt 269.8 lb

## 2021-12-21 DIAGNOSIS — M7061 Trochanteric bursitis, right hip: Secondary | ICD-10-CM | POA: Diagnosis not present

## 2021-12-21 DIAGNOSIS — M7062 Trochanteric bursitis, left hip: Secondary | ICD-10-CM

## 2021-12-21 MED ORDER — METHYLPREDNISOLONE ACETATE 40 MG/ML IJ SUSP
40.0000 mg | Freq: Once | INTRAMUSCULAR | Status: AC
  Administered 2021-12-21: 40 mg via INTRAMUSCULAR

## 2021-12-21 NOTE — Patient Instructions (Signed)
Apply ice pack to left hip every 4-6 hours for the next 24h ?

## 2021-12-21 NOTE — Progress Notes (Signed)
OFFICE NOTE ? ?12/21/2021 ? ?CC:  ?Chief Complaint  ?Patient presents with  ? Hip Pain  ?  Bilateral, ongoing since Nov. Referral to [facility], XR completed and  found out bursitis. Given at home exercises to complete. Pt states L hip is the worse and Friday pain became unbearable.  Has been using Tylenol.   ? ?Patient is a 76 y.o.  male who is here for hip pain. ? ?HPI: ?Complains of bilateral hip pain for the last 2 to 3 months.  In the past has walked regularly for exercise but then after a move he did not walk for a long time.  Then he began a walking regimen again and his hips began hurting. ?Points to lateral aspect of both hips. ?He went to the orthopedist a couple months ago and patient states x-rays were normal and he was told he had bilateral bursitis.  He was given some exercises to do at home and these seem to calm things down for a week or 2 but recently has returned severe. ?The pain extends at times down over the tops and sides of his thighs.  No radiation down the back of his leg into the lower leg or foot.  No groin pain.  No numbness or tingling. ? ? ? ?Past Medical History:  ?Diagnosis Date  ? Acute pain of left shoulder 07/20/2021  ? improved with PT  ? Allergy 1990  ? Arthritis 2010  ? Bilateral hip pain 07/20/2021  ? bursitis - PT helped  ? Colon polyps   ? Coronary artery disease   ? Depression 03/29/2020  ? Formatting of this note might be different from the original. Buproprion XL 150 mg qd  ? Diverticulitis   ? sigmoid  ? GERD (gastroesophageal reflux disease)   ? Glaucoma   ? Gout   ? Heart disease   ? Hypercholesteremia   ? Hypertension   ? Hypothyroid   ? Kidney stones 09/20/2016  ? x2, 1888 and 2070  ? Myocardial infarction [facility])   ? Neuromuscular disorder ([place]) 1995  ? Neuropathy   ? Sleep apnea   ? Dr. Reece Levy  ? Stroke [facility]) 2002  ? TIA  ? TIA (transient ischemic attack)   ? Torn meniscus 01/29/2006  ? Left knee.  ? Type 2 diabetes mellitus with hyperlipidemia ([place])   ? ?Past Surgical  History:  ?Procedure Laterality Date  ? CARDIAC CATHETERIZATION  02/16/2012  ? [facility]  ? CARPAL TUNNEL RELEASE Bilateral   ? Left-01/15/2016.  Right-03/11/2016.  ? COLONOSCOPY W/ POLYPECTOMY  12/20/2016  ? 4 mm sessile tubular adenoma w/multifoci high grade dysplasia '@65'$  cm  ? CORONARY ANGIOPLASTY WITH STENT PLACEMENT  09/16/2010  ? MI-cardiac catheterization-1 stent-OM.  [facility]  ? EYE SURGERY  2017  ? KNEE ARTHROSCOPY Left 01/29/2006  ? Torn meniscus  ? LESION EXCISION Left 04/01/2009  ? Benign.  [facility]  ? MOHS SURGERY    ? ?MEDS;   ?Outpatient Medications Prior to Visit  ?Medication Sig Dispense Refill  ? acetaminophen (TYLENOL) 650 MG CR tablet Take by mouth.    ? allopurinol (ZYLOPRIM) 300 MG tablet Take 1 tablet (300 mg total) by mouth daily. 90 tablet 1  ? Alpha-Lipoic Acid 300 MG CAPS Take by mouth 2 (two) times daily.    ? aspirin 81 MG chewable tablet Chew by mouth daily.    ? atorvastatin (LIPITOR) 80 MG tablet Take 1 tablet (80 mg total) by mouth daily.  90 tablet 3  ? bimatoprost (LUMIGAN) 0.03 % ophthalmic solution 1 drop at bedtime.    ? brimonidine-timolol (COMBIGAN) 0.2-0.5 % ophthalmic solution Place 1 drop into both eyes every 12 (twelve) hours.    ? brinzolamide (AZOPT) 1 % ophthalmic suspension Place 1 drop into the right eye 2 (two) times daily.    ? candesartan-hydrochlorothiazide (ATACAND HCT) 16-12.5 MG tablet Take 1 tablet by mouth daily. 90 tablet 1  ? clopidogrel (PLAVIX) 75 MG tablet Take 1 tablet (75 mg total) by mouth daily. 90 tablet 3  ? Coenzyme Q10 (CO Q-10) 100 MG CAPS     ? Docusate Sodium (DSS) 100 MG CAPS Take by mouth.    ? DULoxetine (CYMBALTA) 60 MG capsule Take 1 capsule (60 mg total) by mouth daily. 90 capsule 1  ? fluticasone (FLONASE) 50 MCG/ACT nasal spray Place 1 spray into both nostrils daily. 16 mL 11  ? levothyroxine (SYNTHROID) 150 MCG tablet Take 1 tablet (150 mcg total) by mouth daily. 90 tablet 3  ?  metoprolol succinate (TOPROL-XL) 25 MG 24 hr tablet Take 0.5 tablets (12.5 mg total) by mouth daily. 45 tablet 1  ? Multiple Vitamin (MULTIVITAMIN) capsule Take 1 capsule by mouth daily.    ? nitroGLYCERIN (NITROSTAT) 0.4 MG SL tablet Place under the tongue.    ? Olopatadine HCl (PATADAY OP) Apply to eye.    ? pantoprazole (PROTONIX) 40 MG tablet Take 1 tablet (40 mg total) by mouth daily. 90 tablet 1  ? polyethylene glycol powder (GLYCOLAX/MIRALAX) 17 GM/SCOOP powder Take by mouth.    ? pregabalin (LYRICA) 150 MG capsule Take 1 capsule (150 mg total) by mouth 2 (two) times daily. 180 capsule 1  ? pregabalin (LYRICA) 150 MG capsule Take 1 capsule (150 mg total) by mouth 2 (two) times daily. 60 capsule 0  ? Semaglutide,0.25 or 0.'5MG'$ /DOS, (OZEMPIC, 0.25 OR 0.5 MG/DOSE,) 2 MG/1.5ML SOPN Inject 0.5 mg as directed once a week. 4.5 mL 2  ? ?No facility-administered medications prior to visit.  ? ? ?PE: ? ?  12/21/2021  ? 10:41 AM 10/21/2021  ? 11:31 AM 08/14/2021  ? 10:36 AM  ?Vitals with BMI  ?Height '5\' 8"'$  '5\' 8"'$    ?Weight 269 lbs 13 oz 273 lbs   ?BMI 41.03 41.52   ?Systolic 366 440 347  ?Diastolic 80 74 71  ?Pulse 79 71 61  ? ?General: Alert and well-appearing. ?He appears to be in pain and walks slowly and gets on the exam table with difficulty. ?He has tenderness to palpation over greater trochanter bilaterally.  Minimal ischial tuberosity tenderness. ?Single-leg standing test elicits significant pain in the lateral hip, left greater than right. ?Flexion at the hip is stiff and a bit uncomfortable but does not reproduce his pain.  Minimal external rotation and internal rotation at the hips reproduces lateral hip pain. ? ?IMPRESSION AND PLAN: ? ?Bilateral gluteal tendonopathy, left greater than right. ?Not responding to home stretching/rehab regimen. ?Offered steroid injection in greater trochanter and recommended he start formal PT. ?He was in agreement with both. ?Referred to [facility] PT today. ? ?Ultrasound-guided  injection is preferred based on studies that show increased duration, increased effect, greater accuracy, decreased procedural pain, increased response rate, and decreased cost with ultrasound-guided versus blind injection. ?Procedure: Real-time ultrasound guided injection of left trochanteric bursa.Marland Kitchen ?Device: GE LogiQ E ?Verbal informed consent obtained.  Timeout conducted.  No overlying erythema, induration, or other signs of local infection. ?After sterile prep with Betadine, injected 3  cc of plain 1% lidocaine initially followed by 40 mg depo medrol+39m plain 1% lidocaine mixture using 3 1/2" 22g needle, passing the needle from lateral approach into medial.   ?Patient tolerated the procedure well.  No immediate complications.  Post-injection care discussed. ?Advised to call if fever/chills, erythema, drainage, or persistent bleeding. ?Impression: Technically successful ultrasound-guided injection. ? ?An After Visit Summary was printed and given to the patient. ? ?FOLLOW UP:  Return for 4-6 wks, earlier if not improving or if pt desires greater troch inj on the R. ? ?Signed:  PCrissie Sickles MD           12/21/2021 ? ?

## 2021-12-26 DIAGNOSIS — R310 Gross hematuria: Secondary | ICD-10-CM

## 2021-12-26 HISTORY — DX: Gross hematuria: R31.0

## 2021-12-29 ENCOUNTER — Ambulatory Visit: Payer: Medicare HMO | Admitting: Podiatry

## 2021-12-29 DIAGNOSIS — M79675 Pain in left toe(s): Secondary | ICD-10-CM

## 2021-12-29 DIAGNOSIS — L84 Corns and callosities: Secondary | ICD-10-CM | POA: Diagnosis not present

## 2021-12-29 DIAGNOSIS — E1169 Type 2 diabetes mellitus with other specified complication: Secondary | ICD-10-CM | POA: Diagnosis not present

## 2021-12-29 DIAGNOSIS — B351 Tinea unguium: Secondary | ICD-10-CM | POA: Diagnosis not present

## 2021-12-29 DIAGNOSIS — M79674 Pain in right toe(s): Secondary | ICD-10-CM

## 2021-12-29 DIAGNOSIS — E669 Obesity, unspecified: Secondary | ICD-10-CM

## 2022-01-02 NOTE — Progress Notes (Signed)
?  Subjective:  ?Patient ID: Juan Stanton, male    DOB: 02/21/1946,  MRN: [id number] ? ?Chief Complaint  ?Patient presents with  ? Nail Problem  ?  Thick painful toenails, 3 month follow up   ? ? ?76 y.o. male returns with the above complaint. History confirmed with patient.  Doing well, no new issues blood sugar remains well controlled ?Objective:  ?Physical Exam: ?warm, good capillary refill, no trophic changes or ulcerative lesions and normal DP and PT pulses.  Absent light touch sensation and protective sensation distally.  He has nail dystrophy and residual onychomycosis of all nails.  Callus present bilateral hallux ? ?Assessment:  ? ?1. Pain due to onychomycosis of toenails of both feet   ?2. Diabetes mellitus type 2 in obese [facility])   ? ? ? ? ?Plan:  ?Patient was evaluated and treated and all questions answered. ? ?Discussed the etiology and treatment options for the condition in detail with the patient. Educated patient on the topical and oral treatment options for mycotic nails. Recommended debridement of the nails today. Sharp and mechanical debridement performed of all painful and mycotic nails today. Nails debrided in length and thickness using a nail nipper. Discussed treatment options including appropriate shoe gear. Follow up as needed for painful nails. ? ?All symptomatic hyperkeratoses were safely debrided with a sterile #15 blade to patient's level of comfort without incident. We discussed preventative and palliative care of these lesions including supportive and accommodative shoegear, padding, prefabricated and custom molded accommodative orthoses, use of a pumice stone and lotions/creams daily. ? ? ? ?Return in about 3 months (around 03/30/2022) for at risk diabetic foot care.  ? ?

## 2022-01-04 ENCOUNTER — Ambulatory Visit (INDEPENDENT_AMBULATORY_CARE_PROVIDER_SITE_OTHER): Payer: Medicare HMO | Admitting: Family Medicine

## 2022-01-04 ENCOUNTER — Encounter: Payer: Self-pay | Admitting: Family Medicine

## 2022-01-04 VITALS — BP 106/66 | HR 86 | Temp 98.6°F | Ht 68.0 in | Wt 267.6 lb

## 2022-01-04 DIAGNOSIS — R319 Hematuria, unspecified: Secondary | ICD-10-CM

## 2022-01-04 DIAGNOSIS — R310 Gross hematuria: Secondary | ICD-10-CM

## 2022-01-04 LAB — POCT URINALYSIS DIPSTICK
Glucose, UA: NEGATIVE
Leukocytes, UA: NEGATIVE
Nitrite, UA: NEGATIVE
Protein, UA: POSITIVE — AB
Spec Grav, UA: 1.025 (ref 1.010–1.025)
Urobilinogen, UA: 0.2 E.U./dL
pH, UA: 5 (ref 5.0–8.0)

## 2022-01-04 MED ORDER — DIPHENHYDRAMINE HCL 50 MG PO TABS
ORAL_TABLET | ORAL | 0 refills | Status: DC
Start: 2022-01-04 — End: 2022-04-07

## 2022-01-04 MED ORDER — PREDNISONE 50 MG PO TABS
ORAL_TABLET | ORAL | 0 refills | Status: DC
Start: 2022-01-04 — End: 2022-04-07

## 2022-01-04 NOTE — Progress Notes (Signed)
OFFICE VISIT ? ?01/04/2022 ? ?CC:  ?Chief Complaint  ?Patient presents with  ? Hematuria  ?  Pt c/o blood in urine since yesterday afternoon. Last night and this morning were bad. Has not taken any otc meds. Denies having any other urinary symptoms  ? ?Patient is a 76 y.o. male who presents for concern of blood in urine. ? ?HPI: ?Patient had onset of blood in urine yesterday, a few small clots with this. ?No pain with urination, no recent urgency or frequency.  No history of BPH or prostatitis. ?No flank pain, abdominal pain, or groin pain. ?No recent fevers.  No blood in stool.  No nosebleeds. ?He is on aspirin and Plavix. ? ?Past Medical History:  ?Diagnosis Date  ? Acute pain of left shoulder 07/20/2021  ? improved with PT  ? Allergy 1990  ? Arthritis 2010  ? Bilateral hip pain 07/20/2021  ? bursitis - PT helped  ? Colon polyps   ? Coronary artery disease   ? Depression 03/29/2020  ? Formatting of this note might be different from the original. Buproprion XL 150 mg qd  ? Diverticulitis   ? sigmoid  ? GERD (gastroesophageal reflux disease)   ? Glaucoma   ? Gout   ? Heart disease   ? Hypercholesteremia   ? Hypertension   ? Hypothyroid   ? Kidney stones 09/20/2016  ? x2, 1888 and 2070  ? Myocardial infarction [facility])   ? Neuromuscular disorder ([place]) 1995  ? Neuropathy   ? Sleep apnea   ? Dr. Reece Levy  ? Stroke [facility]) 2002  ? TIA  ? TIA (transient ischemic attack)   ? Torn meniscus 01/29/2006  ? Left knee.  ? Type 2 diabetes mellitus with hyperlipidemia ([place])   ? ? ?Past Surgical History:  ?Procedure Laterality Date  ? CARDIAC CATHETERIZATION  02/16/2012  ? [facility]  ? CARPAL TUNNEL RELEASE Bilateral   ? Left-01/15/2016.  Right-03/11/2016.  ? COLONOSCOPY W/ POLYPECTOMY  12/20/2016  ? 4 mm sessile tubular adenoma w/multifoci high grade dysplasia '@65'$  cm  ? CORONARY ANGIOPLASTY WITH STENT PLACEMENT  09/16/2010  ? MI-cardiac catheterization-1 stent-OM.  [facility]  ? EYE SURGERY  2017  ? KNEE  ARTHROSCOPY Left 01/29/2006  ? Torn meniscus  ? LESION EXCISION Left 04/01/2009  ? Benign.  [facility]  ? MOHS SURGERY    ? ? ?Outpatient Medications Prior to Visit  ?Medication Sig Dispense Refill  ? acetaminophen (TYLENOL) 650 MG CR tablet Take by mouth.    ? allopurinol (ZYLOPRIM) 300 MG tablet Take 1 tablet (300 mg total) by mouth daily. 90 tablet 1  ? Alpha-Lipoic Acid 300 MG CAPS Take by mouth 2 (two) times daily.    ? aspirin 81 MG chewable tablet Chew by mouth daily.    ? atorvastatin (LIPITOR) 80 MG tablet Take 1 tablet (80 mg total) by mouth daily. 90 tablet 3  ? bimatoprost (LUMIGAN) 0.03 % ophthalmic solution 1 drop at bedtime.    ? brimonidine-timolol (COMBIGAN) 0.2-0.5 % ophthalmic solution Place 1 drop into both eyes every 12 (twelve) hours.    ? brinzolamide (AZOPT) 1 % ophthalmic suspension Place 1 drop into the right eye 2 (two) times daily.    ? candesartan-hydrochlorothiazide (ATACAND HCT) 16-12.5 MG tablet Take 1 tablet by mouth daily. 90 tablet 1  ? clopidogrel (PLAVIX) 75 MG tablet Take 1 tablet (75 mg total) by mouth daily. 90 tablet 3  ? Coenzyme Q10 (CO Q-10) 100  MG CAPS     ? Docusate Sodium (DSS) 100 MG CAPS Take by mouth.    ? DULoxetine (CYMBALTA) 60 MG capsule Take 1 capsule (60 mg total) by mouth daily. 90 capsule 1  ? fluticasone (FLONASE) 50 MCG/ACT nasal spray Place 1 spray into both nostrils daily. 16 mL 11  ? levothyroxine (SYNTHROID) 150 MCG tablet Take 1 tablet (150 mcg total) by mouth daily. 90 tablet 3  ? metoprolol succinate (TOPROL-XL) 25 MG 24 hr tablet Take 0.5 tablets (12.5 mg total) by mouth daily. 45 tablet 1  ? Multiple Vitamin (MULTIVITAMIN) capsule Take 1 capsule by mouth daily.    ? nitroGLYCERIN (NITROSTAT) 0.4 MG SL tablet Place under the tongue.    ? Olopatadine HCl (PATADAY OP) Apply to eye.    ? pantoprazole (PROTONIX) 40 MG tablet Take 1 tablet (40 mg total) by mouth daily. 90 tablet 1  ? polyethylene glycol powder (GLYCOLAX/MIRALAX) 17  GM/SCOOP powder Take by mouth.    ? pregabalin (LYRICA) 150 MG capsule Take 1 capsule (150 mg total) by mouth 2 (two) times daily. 180 capsule 1  ? Semaglutide,0.25 or 0.'5MG'$ /DOS, (OZEMPIC, 0.25 OR 0.5 MG/DOSE,) 2 MG/1.5ML SOPN Inject 0.5 mg as directed once a week. 4.5 mL 2  ? ?No facility-administered medications prior to visit.  ? ? ?Allergies  ?Allergen Reactions  ? Celebrex [Celecoxib] Swelling  ? Iodine Nausea Only  ? ? ?ROS ?As per HPI ? ?PE: ? ?  01/04/2022  ?  3:56 PM 12/21/2021  ? 10:41 AM 10/21/2021  ? 11:31 AM  ?Vitals with BMI  ?Height '5\' 8"'$  '5\' 8"'$  '5\' 8"'$   ?Weight 267 lbs 10 oz 269 lbs 13 oz 273 lbs  ?BMI 40.7 41.03 41.52  ?Systolic 144 315 400  ?Diastolic 66 80 74  ?Pulse 86 79 71  ? ? ? ?Physical Exam ? ?Gen: Alert, well appearing.  Patient is oriented to person, place, time, and situation. ?AFFECT: pleasant, lucid thought and speech. ?CV: RRR, no m/r/g.   ?LUNGS: CTA bilat, nonlabored resps, good aeration in all lung fields. ?CVA: non tender bilat.   ?No abd tenderness. ? ? ?LABS:  ?Last CBC ?Lab Results  ?Component Value Date  ? WBC 6.9 07/20/2021  ? HGB 14.4 07/20/2021  ? HCT 43.5 07/20/2021  ? MCV 94.8 07/20/2021  ? MCH 30.3 09/21/2016  ? RDW 13.7 07/20/2021  ? PLT 162.0 07/20/2021  ? ?Last metabolic panel ?Lab Results  ?Component Value Date  ? GLUCOSE 104 (H) 03/03/2021  ? NA 140 03/03/2021  ? K 4.2 03/03/2021  ? CL 104 03/03/2021  ? CO2 24 03/03/2021  ? BUN 22 03/03/2021  ? CREATININE 0.83 03/03/2021  ? GFRNONAA 46 (L) 09/22/2016  ? CALCIUM 9.0 03/03/2021  ? PROT 6.4 03/03/2021  ? ALBUMIN 4.3 03/03/2021  ? BILITOT 0.6 03/03/2021  ? ALKPHOS 49 03/03/2021  ? AST 23 03/03/2021  ? ALT 36 03/03/2021  ? ANIONGAP 8 09/22/2016  ? ?Last hemoglobin A1c ?Lab Results  ?Component Value Date  ? HGBA1C 5.8 (A) 10/21/2021  ? HGBA1C 5.8 10/21/2021  ? HGBA1C 5.8 10/21/2021  ? HGBA1C 5.8 10/21/2021  ? ?POC CC dipstick UA today: amber,1+ blood ? ?IMPRESSION AND PLAN: ? ?Painless gross hematuria. ?CT abdomen renal with  and without contrast ordered.  History of iodinated contrast reaction--> severe nausea but no angioedema, rash, or anaphylaxis. ?Discussed with radiologist and this will still require pre-CT treatment with prednisone and Benadryl.   ?Prednisone 50 mg every 6 hours x3  doses, starting 13 hours prior to procedure.  Benadryl 50 mg 1 hour prior to procedure. ?Bmet and CBC today. ?Sent urine for culture for completeness. ? ?It is okay for him to continue aspirin and Plavix at this time. ? ?An After Visit Summary was printed and given to the patient. ? ?FOLLOW UP: Return for Follow-up to be determined based on imaging and specialist evaluation. ? ?Signed:  Crissie Sickles, MD           01/04/2022 ? ?

## 2022-01-05 LAB — BASIC METABOLIC PANEL
BUN/Creatinine Ratio: 31 (calc) — ABNORMAL HIGH (ref 6–22)
BUN: 29 mg/dL — ABNORMAL HIGH (ref 7–25)
CO2: 26 mmol/L (ref 20–32)
Calcium: 9.1 mg/dL (ref 8.6–10.3)
Chloride: 107 mmol/L (ref 98–110)
Creat: 0.95 mg/dL (ref 0.70–1.28)
Glucose, Bld: 100 mg/dL — ABNORMAL HIGH (ref 65–99)
Potassium: 4.9 mmol/L (ref 3.5–5.3)
Sodium: 144 mmol/L (ref 135–146)

## 2022-01-05 LAB — CBC
HCT: 39.9 % (ref 38.5–50.0)
Hemoglobin: 13.3 g/dL (ref 13.2–17.1)
MCH: 30.2 pg (ref 27.0–33.0)
MCHC: 33.3 g/dL (ref 32.0–36.0)
MCV: 90.5 fL (ref 80.0–100.0)
MPV: 9.4 fL (ref 7.5–12.5)
Platelets: 273 10*3/uL (ref 140–400)
RBC: 4.41 10*6/uL (ref 4.20–5.80)
RDW: 11.9 % (ref 11.0–15.0)
WBC: 10.5 10*3/uL (ref 3.8–10.8)

## 2022-01-06 LAB — URINALYSIS, ROUTINE W REFLEX MICROSCOPIC
Bilirubin Urine: NEGATIVE
Hgb urine dipstick: NEGATIVE
Leukocytes,Ua: NEGATIVE
Nitrite: NEGATIVE
Specific Gravity, Urine: 1.025 (ref 1.000–1.030)
Total Protein, Urine: NEGATIVE
Urine Glucose: NEGATIVE
Urobilinogen, UA: 0.2 (ref 0.0–1.0)
pH: 5.5 (ref 5.0–8.0)

## 2022-01-06 LAB — URINE CULTURE
MICRO NUMBER:: 13243460
Result:: NO GROWTH
SPECIMEN QUALITY:: ADEQUATE

## 2022-01-14 ENCOUNTER — Telehealth: Payer: Self-pay

## 2022-01-14 DIAGNOSIS — R310 Gross hematuria: Secondary | ICD-10-CM

## 2022-01-14 NOTE — Telephone Encounter (Signed)
Please review and advise.

## 2022-01-14 NOTE — Telephone Encounter (Signed)
Patient following up on referral request and MRI/CT scan ? ?Please call patient [phone] ? ?

## 2022-01-14 NOTE — Telephone Encounter (Signed)
I have a problem with "CT of Abdom and Pelic with contrast cpt 74178" b/c when I choose [phone] it comes up as oncology/liver protocol.  Why?? The reason for the scan is gross hematuria. ?

## 2022-01-15 NOTE — Telephone Encounter (Signed)
Pt advised of imaging update.  ?

## 2022-01-15 NOTE — Telephone Encounter (Signed)
Please notify patient that due to insurer restrictions I have changed the imaging test to a kidney ultrasound. ?Please cancel the CT abdomen that is currently ordered. ? ?

## 2022-01-15 NOTE — Addendum Note (Signed)
Addended by: Tammi Sou on: 01/15/2022 07:27 AM ? ? Modules accepted: Orders ? ?

## 2022-01-18 ENCOUNTER — Ambulatory Visit (HOSPITAL_BASED_OUTPATIENT_CLINIC_OR_DEPARTMENT_OTHER)
Admission: RE | Admit: 2022-01-18 | Discharge: 2022-01-18 | Disposition: A | Discharge status: Home / Self Care | Payer: Medicare HMO | Source: Ambulatory Visit | Attending: Family Medicine | Admitting: Family Medicine

## 2022-01-18 DIAGNOSIS — R310 Gross hematuria: Secondary | ICD-10-CM | POA: Insufficient documentation

## 2022-01-19 ENCOUNTER — Encounter: Payer: Self-pay | Admitting: Family Medicine

## 2022-02-15 ENCOUNTER — Telehealth: Payer: Self-pay

## 2022-02-15 NOTE — Telephone Encounter (Signed)
User error - please see 4/20 telephone message

## 2022-02-15 NOTE — Telephone Encounter (Signed)
Eneyda H. Referral Coordinator calling from [facility] Regarding CT Abdomen Pelvis.  Patient is going to be seen in their office, provider there wants to order CT abdomen pelvis on patient.  They are unable to get it authorized thru patient's insurance because there is an outstanding authorized order for CT Abdomen Pelvis originally ordered by [facility] in April.  Our office must call [employer] to "withdraw CT Abdomen Pelvis ordered by our office. Contact [employer] at [phone] Ref#  [phone]  Lunette Stands at [facility] can be reached at [phone] ext [phone].  Please call her when we have called [employer] to have cancelled.  Let me know if can assist in any way. Thank you, Hinton Dyer

## 2022-03-03 ENCOUNTER — Ambulatory Visit (HOSPITAL_COMMUNITY)
Admission: RE | Admit: 2022-03-03 | Discharge: 2022-03-03 | Disposition: A | Discharge status: Home / Self Care | Payer: Medicare HMO | Source: Ambulatory Visit | Attending: Physician Assistant | Admitting: Physician Assistant

## 2022-03-03 ENCOUNTER — Encounter (HOSPITAL_COMMUNITY): Payer: Self-pay

## 2022-03-03 VITALS — BP 128/83 | HR 101 | Temp 98.2°F | Resp 18

## 2022-03-03 DIAGNOSIS — L729 Follicular cyst of the skin and subcutaneous tissue, unspecified: Secondary | ICD-10-CM | POA: Diagnosis not present

## 2022-03-03 DIAGNOSIS — L089 Local infection of the skin and subcutaneous tissue, unspecified: Secondary | ICD-10-CM

## 2022-03-03 MED ORDER — DOXYCYCLINE HYCLATE 100 MG PO CAPS: 100.0000 mg | ORAL_CAPSULE | Freq: Two times a day (BID) | ORAL | 0 refills | Status: DC

## 2022-03-03 MED ORDER — LIDOCAINE HCL (PF) 1 % IJ SOLN
INTRAMUSCULAR | Status: AC
  Filled 2022-03-03: qty 30

## 2022-03-03 NOTE — ED Provider Notes (Signed)
[place]    CSN: [id number] Arrival date & time: 03/03/22  1415      History   Chief Complaint Chief Complaint  Patient presents with   Blister    Boil ??? on back. - Entered by patient    HPI Juan Stanton is a 76 y.o. male.   Patient presents today with a 2-day history of enlarging painful lesion on his left flank.  He denies any known injury prior to symptom onset.  Reports pain with palpation which is rated 5 on a 0-10 pain scale, described as sharp, no aggravating relieving factors identified.  He denies history of recurrent skin infections or MRSA.  He has not tried any over-the-counter medication for symptom management.  He does have a history of diabetes but reports blood sugars are well controlled.  Denies any recent antibiotic use.  He denies any systemic symptoms including fever, nausea, vomiting, headache, weakness.   Past Medical History:  Diagnosis Date   Acute pain of left shoulder 07/20/2021   improved with PT   Allergy 1990   Arthritis 2010   Bilateral hip pain 07/20/2021   bursitis - PT helped   Colon polyps    Coronary artery disease    Depression 03/29/2020   Formatting of this note might be different from the original. Buproprion XL 150 mg qd   Diabetes mellitus type 2 in obese ([place])    Diverticulitis    sigmoid   Fatty liver    noted on renal ultrasound 12/2021   GERD (gastroesophageal reflux disease)    Glaucoma    Gout    Gross hematuria 12/2021   renal ultrasound normal   Heart disease    Hypercholesteremia    Hypertension    Hypothyroid    Kidney stones 09/20/2016   x2, 1888 and 2070   Myocardial infarction [facility])    Neuromuscular disorder ([place]) 1995   Neuropathy    Sleep apnea    Dr. Reece Levy   Stroke [facility]) 2002   TIA   TIA (transient ischemic attack)    Torn meniscus 01/29/2006   Left knee.   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) 03/06/2020   Peripheral neuropathy, hereditary/idiopathic 09/27/2018   Acquired hypothyroidism 10/01/2016   Chronic gout involving toe without tophus 10/01/2016   History of MI (myocardial infarction) 10/01/2016   Glaucoma, both eyes 09/27/1988    Past Surgical History:  Procedure Laterality Date   CARDIAC CATHETERIZATION  02/16/2012   [facility]   CARPAL TUNNEL RELEASE Bilateral    Left-01/15/2016.  Right-03/11/2016.   COLONOSCOPY W/ POLYPECTOMY  12/20/2016   4 mm sessile tubular adenoma w/multifoci high grade dysplasia '@65'$  cm   CORONARY ANGIOPLASTY WITH STENT PLACEMENT  09/16/2010   MI-cardiac catheterization-1 stent-OM.  [facility]  EYE SURGERY  2017   KNEE ARTHROSCOPY Left 01/29/2006   Torn meniscus   LESION EXCISION Left 04/01/2009   Benign.  [facility]   [place] Medications    Prior to Admission medications   Medication Sig Start Date End Date Taking? Authorizing Provider  doxycycline (VIBRAMYCIN) 100 MG capsule Take 1 capsule (100 mg total) by mouth 2 (two) times daily. 03/03/22  Yes Song Myre, Derry Skill, PA-C  acetaminophen (TYLENOL) 650 MG CR tablet Take by mouth. 10/03/19   [provider]  allopurinol (ZYLOPRIM) 300 MG tablet Take 1  tablet (300 mg total) by mouth daily. 10/21/21   Kuneff, Renee A, DO  Alpha-Lipoic Acid 300 MG CAPS Take by mouth 2 (two) times daily.    [provider]  aspirin 81 MG chewable tablet Chew by mouth daily.    [provider]  atorvastatin (LIPITOR) 80 MG tablet Take 1 tablet (80 mg total) by mouth daily. 10/21/21   Kuneff, Renee A, DO  bimatoprost (LUMIGAN) 0.03 % ophthalmic solution 1 drop at bedtime.    [provider]  brimonidine-timolol (COMBIGAN) 0.2-0.5 % ophthalmic solution Place 1 drop into both eyes every 12 (twelve) hours.    [provider]  brinzolamide (AZOPT) 1 % ophthalmic suspension Place 1 drop into the right eye 2 (two) times daily.    [provider]  candesartan-hydrochlorothiazide (ATACAND HCT) 16-12.5 MG tablet Take 1 tablet by mouth daily. 10/21/21   Kuneff, Renee A, DO  clopidogrel (PLAVIX) 75 MG tablet Take 1 tablet (75 mg total) by mouth daily. 06/12/21   Kuneff, Renee A, DO  Coenzyme Q10 (CO Q-10) 100 MG CAPS     [provider]  diphenhydrAMINE (BENADRYL) 50 MG tablet 1 tab po ONE HOUR prior to CT scan 01/04/22   McGowen, Adrian Blackwater, MD  Docusate Sodium (DSS) 100 MG CAPS Take by mouth.    [provider]  DULoxetine (CYMBALTA) 60 MG capsule Take 1 capsule (60 mg total) by mouth daily. 10/21/21   Kuneff, Renee A, DO  fluticasone (FLONASE) 50 MCG/ACT nasal spray Place 1 spray into both nostrils daily. 12/01/21   Kuneff, Renee A, DO  levothyroxine (SYNTHROID) 150 MCG tablet Take 1 tablet (150 mcg total) by mouth daily. 10/21/21   Kuneff, Renee A, DO  metoprolol succinate (TOPROL-XL) 25 MG 24 hr tablet Take 0.5 tablets (12.5 mg total) by mouth daily. 10/21/21   Kuneff, Renee A, DO  Multiple Vitamin (MULTIVITAMIN) capsule Take 1 capsule by mouth daily.    [provider]  nitroGLYCERIN (NITROSTAT) 0.4 MG SL tablet Place under the tongue. 05/27/20   [provider]  Olopatadine HCl (PATADAY OP) Apply to eye.     [provider]  pantoprazole (PROTONIX) 40 MG tablet Take 1 tablet (40 mg total) by mouth daily. 10/21/21   Kuneff, Renee A, DO  polyethylene glycol powder (GLYCOLAX/MIRALAX) 17 GM/SCOOP powder Take by mouth. 10/03/19   [provider]  predniSONE (DELTASONE) 50 MG tablet 1 tab po q6h x 3 doses.  Take 1st dose 13 hours prior to CT scan. 01/04/22   McGowen, Adrian Blackwater, MD  pregabalin (LYRICA) 150 MG capsule Take 1 capsule (150 mg total) by mouth 2 (two) times daily. 10/21/21   Kuneff, Renee A, DO  Semaglutide,0.25 or 0.'5MG'$ /DOS, (OZEMPIC, 0.25 OR 0.5 MG/DOSE,) 2 MG/1.5ML SOPN Inject 0.5 mg as directed once a week. 11/16/21   Kuneff, Renee A, DO  Family History Family History  Problem Relation Age of Onset   Depression Mother    Diabetes Mother    Hyperlipidemia Mother    Hypertension Mother    Hypertension Father    Depression Sister    Hyperlipidemia Sister    Hypertension Sister    Alcohol abuse Brother    Diabetes Brother    Heart attack Brother    Heart disease Brother    Hyperlipidemia Brother    Hypertension Brother    Breast cancer Maternal Grandmother    Kidney disease Maternal Grandfather    Stroke Paternal Grandmother    Stroke Paternal Grandfather    Heart disease Brother    Hyperlipidemia Brother    Hypertension Brother     Social History Social History   Tobacco Use   Smoking status: Former    Packs/day: 3.00    Years: 5.00    Pack years: 15.00    Types: Cigarettes    Quit date: 02/11/1980    Years since quitting: 42.0   Smokeless tobacco: Never  Vaping Use   Vaping Use: Never used  Substance Use Topics   Alcohol use: Yes    Alcohol/week: 2.0 standard drinks    Types: 2 Cans of beer per week   Drug use: No     Allergies   Celebrex [celecoxib] and Iodine   Review of Systems Review of Systems  Constitutional:  Positive for activity change. Negative for appetite change, fatigue and fever.  Respiratory:  Negative for cough and  shortness of breath.   Cardiovascular:  Negative for chest pain.  Gastrointestinal:  Negative for abdominal pain, diarrhea, nausea and vomiting.  Skin:  Positive for color change and wound.    Physical Exam Triage Vital Signs ED Triage Vitals  Enc Vitals Group     BP 03/03/22 1443 128/83     Pulse Rate 03/03/22 1443 (!) 101     Resp 03/03/22 1443 18     Temp 03/03/22 1443 98.2 F (36.8 C)     Temp Source 03/03/22 1443 Oral     SpO2 03/03/22 1443 94 %     Weight --      Height --      Head Circumference --      Peak Flow --      Pain Score 03/03/22 1442 0     Pain Loc --      Pain Edu? --      Excl. in [place]? --    No data found.  Updated Vital Signs BP 128/83   Pulse (!) 101   Temp 98.2 F (36.8 C) (Oral)   Resp 18   SpO2 94%   Visual Acuity Right Eye Distance:   Left Eye Distance:   Bilateral Distance:    Right Eye Near:   Left Eye Near:    Bilateral Near:     Physical Exam Vitals reviewed.  Constitutional:      General: He is awake.     Appearance: Normal appearance. He is well-developed. He is not ill-appearing.     Comments: Very pleasant male appears stated age in no acute distress sitting comfortably in exam room  HENT:     Head: Normocephalic and atraumatic.  Pulmonary:     Effort: Pulmonary effort is normal. No accessory muscle usage or respiratory distress.     Breath sounds: No stridor.  Skin:    Findings: Abscess present.          Comments: 3 cm x  1 cm abscess noted left flank.  Neurological:     Mental Status: He is alert.  Psychiatric:        Behavior: Behavior is cooperative.     UC Treatments / Results  Labs (all labs ordered are listed, but only abnormal results are displayed) Labs Reviewed - No data to display  EKG   Radiology No results found.  Procedures Incision and Drainage  Date/Time: 03/03/2022 3:06 PM Performed by: Terrilee Croak, PA-C Authorized by: Terrilee Croak, PA-C   Consent:    Consent obtained:  Verbal    Consent given by:  Patient   Risks, benefits, and alternatives were discussed: yes     Risks discussed:  Incomplete drainage, infection and damage to other organs   Alternatives discussed:  Observation and alternative treatment Universal protocol:    Procedure explained and questions answered to patient or proxy's satisfaction: yes     Patient identity confirmed:  Verbally with patient Location:    Type:  Cyst   Size:  3 cm x 1 cm   Location:  Trunk   Trunk location:  Abdomen Pre-procedure details:    Skin preparation:  Chlorhexidine with alcohol Sedation:    Sedation type:  None Anesthesia:    Anesthesia method:  Local infiltration   Local anesthetic:  Lidocaine 1% WITH epi Procedure type:    Complexity:  Simple Procedure details:    Ultrasound guidance: no     Needle aspiration: no     Incision types:  Stab incision   Incision depth:  Dermal   Wound management:  Probed and deloculated and irrigated with saline   Drainage:  Bloody and purulent   Drainage amount:  Moderate   Wound treatment:  Wound left open   Packing materials:  None Post-procedure details:    Procedure completion:  Tolerated well, no immediate complications (including critical care time)  Medications Ordered in UC Medications - No data to display  Initial Impression / Assessment and Plan / UC Course  I have reviewed the triage vital signs and the nursing notes.  Pertinent labs & imaging results that were available during my care of the patient were reviewed by me and considered in my medical decision making (see chart for details).     Abscess was drained in clinic.  See the procedure note above.  Patient tolerated this well.  He was started on doxycycline 100 mg twice daily for 10 days.  Recommended that he avoid sun exposure while on this medication due to photosensitivity associated with this medicine.  He is to keep area clean with soap and water.  If he develops any worsening symptoms including  fever, spread of lesion, increased pain, nausea/vomiting, change in characteristic of drainage he is to be seen immediately to which' \\he'$  expressed understanding.  Final Clinical Impressions(s) / UC Diagnoses   Final diagnoses:  Infected cyst of skin     Discharge Instructions      We drained the cyst.  Please keep it clean with soap and water.  You may notice some mild drainage.  Start doxycycline 100 mg twice daily for 10 days.  Stay out of the sun while on this medication.  You can alternate Tylenol and ibuprofen for pain.  If you have any worsening symptoms including enlarging lesion, fever, nausea, vomiting, change in drainage, increased pain you need to be seen immediately.    ED Prescriptions     Medication Sig Dispense Auth. Provider   doxycycline (VIBRAMYCIN) 100  MG capsule Take 1 capsule (100 mg total) by mouth 2 (two) times daily. 20 capsule Cicley Ganesh, Derry Skill, PA-C      PDMP not reviewed this encounter.   Terrilee Croak, PA-C 03/03/22 1507

## 2022-03-03 NOTE — ED Triage Notes (Signed)
Pt is present today with a blister on his left lower ba ck . Pt states he noticed x2 days ago

## 2022-03-03 NOTE — Discharge Instructions (Signed)
We drained the cyst.  Please keep it clean with soap and water.  You may notice some mild drainage.  Start doxycycline 100 mg twice daily for 10 days.  Stay out of the sun while on this medication.  You can alternate Tylenol and ibuprofen for pain.  If you have any worsening symptoms including enlarging lesion, fever, nausea, vomiting, change in drainage, increased pain you need to be seen immediately.

## 2022-04-04 ENCOUNTER — Other Ambulatory Visit: Payer: Self-pay | Admitting: Family Medicine

## 2022-04-06 ENCOUNTER — Ambulatory Visit: Payer: Medicare HMO | Admitting: Podiatry

## 2022-04-06 DIAGNOSIS — L84 Corns and callosities: Secondary | ICD-10-CM

## 2022-04-06 DIAGNOSIS — E669 Obesity, unspecified: Secondary | ICD-10-CM | POA: Diagnosis not present

## 2022-04-06 DIAGNOSIS — E1169 Type 2 diabetes mellitus with other specified complication: Secondary | ICD-10-CM | POA: Diagnosis not present

## 2022-04-06 DIAGNOSIS — M79675 Pain in left toe(s): Secondary | ICD-10-CM

## 2022-04-06 DIAGNOSIS — B351 Tinea unguium: Secondary | ICD-10-CM

## 2022-04-06 DIAGNOSIS — M79674 Pain in right toe(s): Secondary | ICD-10-CM

## 2022-04-06 NOTE — Progress Notes (Signed)
  Subjective:  Patient ID: Juan Stanton, male    DOB: 06-03-1946,  MRN: [id number]  Chief Complaint  Patient presents with   Nail Problem    Nail trim     76 y.o. male returns with the above complaint. History confirmed with patient.  Overall doing well reports no new issues.  Says his blood sugar is still well controlled  Objective:  Physical Exam: warm, good capillary refill, no trophic changes or ulcerative lesions and normal DP and PT pulses.  Absent light touch sensation and protective sensation distally.  He has nail dystrophy and residual onychomycosis of all nails.  Callus present bilateral hallux  Assessment:   1. Pain due to onychomycosis of toenails of both feet   2. Diabetes mellitus type 2 in obese (HCC)   3. Callus of foot       Plan:  Patient was evaluated and treated and all questions answered.  Discussed the etiology and treatment options for the condition in detail with the patient. Educated patient on the topical and oral treatment options for mycotic nails. Recommended debridement of the nails today. Sharp and mechanical debridement performed of all painful and mycotic nails today. Nails debrided in length and thickness using a nail nipper. Discussed treatment options including appropriate shoe gear. Follow up as needed for painful nails.  All symptomatic hyperkeratoses were safely debrided with a sterile #15 blade to patient's level of comfort without incident. We discussed preventative and palliative care of these lesions including supportive and accommodative shoegear, padding, prefabricated and custom molded accommodative orthoses, use of a pumice stone and lotions/creams daily.    Return in about 3 months (around 07/07/2022) for at risk diabetic foot care.

## 2022-04-07 ENCOUNTER — Ambulatory Visit (INDEPENDENT_AMBULATORY_CARE_PROVIDER_SITE_OTHER): Payer: Medicare HMO | Admitting: Family Medicine

## 2022-04-07 ENCOUNTER — Encounter: Payer: Self-pay | Admitting: Family Medicine

## 2022-04-07 VITALS — BP 105/66 | HR 62 | Temp 98.2°F | Wt 277.2 lb

## 2022-04-07 DIAGNOSIS — I10 Essential (primary) hypertension: Secondary | ICD-10-CM | POA: Diagnosis not present

## 2022-04-07 DIAGNOSIS — E1169 Type 2 diabetes mellitus with other specified complication: Secondary | ICD-10-CM | POA: Diagnosis not present

## 2022-04-07 DIAGNOSIS — E785 Hyperlipidemia, unspecified: Secondary | ICD-10-CM

## 2022-04-07 DIAGNOSIS — I2510 Atherosclerotic heart disease of native coronary artery without angina pectoris: Secondary | ICD-10-CM

## 2022-04-07 DIAGNOSIS — Z955 Presence of coronary angioplasty implant and graft: Secondary | ICD-10-CM

## 2022-04-07 DIAGNOSIS — E039 Hypothyroidism, unspecified: Secondary | ICD-10-CM | POA: Diagnosis not present

## 2022-04-07 DIAGNOSIS — I252 Old myocardial infarction: Secondary | ICD-10-CM

## 2022-04-07 LAB — COMPREHENSIVE METABOLIC PANEL
ALT: 35 U/L (ref 0–53)
AST: 27 U/L (ref 0–37)
Albumin: 4.2 g/dL (ref 3.5–5.2)
Alkaline Phosphatase: 51 U/L (ref 39–117)
BUN: 19 mg/dL (ref 6–23)
CO2: 29 mEq/L (ref 19–32)
Calcium: 9.1 mg/dL (ref 8.4–10.5)
Chloride: 105 mEq/L (ref 96–112)
Creatinine, Ser: 0.98 mg/dL (ref 0.40–1.50)
GFR: 76.15 mL/min (ref >60.00)
Glucose, Bld: 117 mg/dL — ABNORMAL HIGH (ref 70–99)
Potassium: 4.2 mEq/L (ref 3.5–5.1)
Sodium: 140 mEq/L (ref 135–145)
Total Bilirubin: 0.4 mg/dL (ref 0.2–1.2)
Total Protein: 6.4 g/dL (ref 6.0–8.3)

## 2022-04-07 LAB — T4, FREE: Free T4: 0.94 ng/dL (ref 0.60–1.60)

## 2022-04-07 LAB — POCT GLYCOSYLATED HEMOGLOBIN (HGB A1C)
HbA1c POC (<> result, manual entry): 5.7 % (ref 4.0–5.6)
HbA1c, POC (controlled diabetic range): 5.7 % (ref 0.0–7.0)
HbA1c, POC (prediabetic range): 5.7 % (ref 5.7–6.4)
Hemoglobin A1C: 5.7 % — AB (ref 4.0–5.6)

## 2022-04-07 LAB — MICROALBUMIN / CREATININE URINE RATIO
Creatinine,U: 136.9 mg/dL
Microalb Creat Ratio: 0.5 mg/g (ref 0.0–30.0)
Microalb, Ur: <0.7 mg/dL (ref 0.0–1.9)

## 2022-04-07 LAB — TSH: TSH: 3.67 u[IU]/mL (ref 0.35–5.50)

## 2022-04-07 MED ORDER — PANTOPRAZOLE SODIUM 40 MG PO TBEC
40.0000 mg | DELAYED_RELEASE_TABLET | Freq: Every day | ORAL | 1 refills | Status: DC
Start: 2022-04-07 — End: 2022-10-18

## 2022-04-07 MED ORDER — PREGABALIN 150 MG PO CAPS
150.0000 mg | ORAL_CAPSULE | Freq: Two times a day (BID) | ORAL | 1 refills | Status: DC
Start: 2022-04-07 — End: 2022-09-21

## 2022-04-07 MED ORDER — SEMAGLUTIDE (1 MG/DOSE) 4 MG/3ML ~~LOC~~ SOPN: 1.0000 mg | PEN_INJECTOR | SUBCUTANEOUS | 5 refills | Status: DC

## 2022-04-07 MED ORDER — LEVOTHYROXINE SODIUM 150 MCG PO TABS
150.0000 ug | ORAL_TABLET | Freq: Every day | ORAL | 3 refills | Status: DC
Start: 2022-04-07 — End: 2022-10-20

## 2022-04-07 MED ORDER — DULOXETINE HCL 60 MG PO CPEP
60.0000 mg | ORAL_CAPSULE | Freq: Every day | ORAL | 1 refills | Status: DC
Start: 2022-04-07 — End: 2022-10-18

## 2022-04-07 MED ORDER — CLOPIDOGREL BISULFATE 75 MG PO TABS: 75.0000 mg | ORAL_TABLET | Freq: Every day | ORAL | 3 refills | Status: DC

## 2022-04-07 MED ORDER — ALLOPURINOL 300 MG PO TABS
300.0000 mg | ORAL_TABLET | Freq: Every day | ORAL | 1 refills | Status: DC
Start: 2022-04-07 — End: 2022-09-21

## 2022-04-07 MED ORDER — METOPROLOL SUCCINATE ER 25 MG PO TB24
12.5000 mg | ORAL_TABLET | Freq: Every day | ORAL | 1 refills | Status: DC
Start: 2022-04-07 — End: 2022-07-12

## 2022-04-07 MED ORDER — CANDESARTAN CILEXETIL-HCTZ 16-12.5 MG PO TABS: 1.0000 | ORAL_TABLET | Freq: Every day | ORAL | 1 refills | Status: DC

## 2022-04-07 NOTE — Progress Notes (Signed)
Patient ID: Juan Stanton, male  DOB: [birth year], 76 y.o.   MRN: [id number] Patient Care Team    Relationship Specialty Notifications Start End  Ma Hillock, DO PCP - General Family Medicine  03/03/21   Sharyne Peach, MD Consulting Physician Ophthalmology  03/02/21   Alfonso Patten, MD Referring Physician Cardiology  03/02/21   Ulla Gallo, MD Consulting Physician Dermatology  03/02/21   Criselda Peaches, DPM Consulting Physician Podiatry  03/02/21   Elmarie Mainland, MD Consulting Physician Sleep Medicine  03/02/21     Chief Complaint  Patient presents with   Diabetes    Pt is not fasting    Subjective: Juan Stanton is a 76 y.o.  male present for [facility] Gastroesophageal reflux disease, unspecified whether esophagitis present Patient reports condition is stable as long as he remains on Protonix 40 mg daily.  Patient is compliant.  No complaints  Acquired hypothyroidism Patient reports compliance  with 150 mg of levothyroxine daily.  Labs due  Obstructive sleep apnea (adult) (pediatric) Managed by pulmonology.  Spinal stenosis of lumbar region without neurogenic claudication/Peripheral neuropathy, hereditary/idiopathic Patient reports  He would like to take the 150 Lyrica twice daily.  He has continued the B12.  B12 responded well per lab. Pt feels medication has been helpful.  He has started seeing a neuropathy specialist at the chiropractic office. Prior note He reports he has neuropathy of both his distal lower extremities and distal upper extremities.  He states he has been worked up by neurology and there is an unknown cause of his neuropathy.  He has been prescribed Cymbalta 90 mg daily and Lyrica 100 mg twice daily.  He states these are both very helpful, however he believes his neuropathy has been worsening over the last few months.  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 Blood pressures ranges at home within normal limits. Patient denies chest pain, shortness of breath, dizziness or lower extremity edema.  He follows w/ cardiology Pt is  prescribed statin.  Patient is on chronic anticoagulation with aspirin/Plavix  Diabetes/obesity Pt reports compliance with Ozempic 0.5 mg weekly.  He states he is tolerating the medicine well.out side effects.  He has not appreciated any weight loss.  Patient denies dizziness, hyperglycemic or hypoglycemic events. Patient denies numbness, tingling in the extremities or nonhealing wounds of feet.   BP 105/66   Pulse 62   Temp 98.2 F (36.8 C)   Wt 277 lb 3.2 oz (125.7 kg)   SpO2 94%   BMI 42.15 kg/m  Physical Exam Vitals and nursing note reviewed.  Constitutional:      General: He is not in acute distress.    Appearance: Normal appearance. He is obese. He is not ill-appearing, toxic-appearing or diaphoretic.  HENT:     Head: Normocephalic and atraumatic.  Eyes:     General: No scleral icterus.       Right eye: No discharge.        Left eye: No discharge.     Extraocular Movements: Extraocular movements intact.     Pupils: Pupils are equal, round, and reactive to light.  Cardiovascular:     Rate and Rhythm: Normal rate and regular  rhythm.     Heart sounds: No murmur heard.    No friction rub. No gallop.  Pulmonary:     Effort: Pulmonary effort is normal. No respiratory distress.     Breath sounds: Normal breath sounds. No wheezing, rhonchi or rales.  Musculoskeletal:     Cervical back: Neck supple.     Right lower leg: No edema.     Left lower leg: No  edema.  Lymphadenopathy:     Cervical: No cervical adenopathy.  Skin:    General: Skin is warm and dry.     Coloration: Skin is not jaundiced or pale.     Findings: No rash.  Neurological:     Mental Status: He is alert and oriented to person, place, and time. Mental status is at baseline.  Psychiatric:        Mood and Affect: Mood normal.        Behavior: Behavior normal.        Thought Content: Thought content normal.        Judgment: Judgment normal.    Diabetic Foot Exam - Simple   Simple Foot Form Diabetic Foot exam was performed with the following findings: Yes 04/07/2022 11:02 AM  Visual Inspection No deformities, no ulcerations, no other skin breakdown bilaterally: Yes Sensation Testing Intact to touch and monofilament testing bilaterally: Yes Pulse Check Posterior Tibialis and Dorsalis pulse intact bilaterally: Yes Comments     Assessment/plan: Juan Stanton is a 76 y.o. male present for [facility] Gastroesophageal reflux disease, unspecified whether esophagitis present Stable. Continue Protonix 40 mg daily  Acquired hypothyroidism Has been stable.  TSH collected today. Continue levothyroxine 150 micrograms daily.  If dose needs altered after results will prescribe new dose to pharmacy for him.  Obstructive sleep apnea (adult) (pediatric) Managed by pulmonology  Spinal stenosis of lumbar region without neurogenic claudication/Peripheral neuropathy, hereditary/idiopathic Stable. Continue Cymbalta 60 mg daily - Continue Lyrica to 150 mg twice daily.  [place] controlled substance database reviewed today. 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 Continue Plavix 75 mg daily Continue atorvastatin 80 mg daily. Continue candesartan/HCTZ 16-12.5 mg daily. Continue to cut to half of a dose daily metoprolol XL 25 mg daily  Obesity/diabetes type II in obese Stable Increase Ozempic 0.5 mg weekly > 1 mg weekly.  Diabetes management is well controlled would like to maximize therapy for cardiovascular protection and weight loss benefits. - Hemoglobin A1c> 5.5 >5.8 >5.7 today Very tight control of his sugars are recommended given his cardiac conditions. Patient is established with podiatry Yearly eye exams encouraged-completed 06/08/2021 Foot exam up-to-date 04/07/2022 Urine microalbumin collected today CMP collected today  Gout: Stable Continue allopurinol 300 mg daily   Return in about 5 months (around 09/06/2022) for cpe (40 min), Routine chronic condition follow-up.  Orders Placed This Encounter  Procedures   Microalbumin / creatinine urine ratio   TSH   T4, free   Comp Met (CMET)   POCT glycosylated hemoglobin (Hb A1C)   Meds ordered this encounter  Medications   allopurinol (ZYLOPRIM) 300 MG tablet    Sig: Take 1 tablet (300 mg total) by mouth daily.    Dispense:  90 tablet    Refill:  1   candesartan-hydrochlorothiazide (ATACAND HCT)  16-12.5 MG tablet    Sig: Take 1 tablet by mouth daily.    Dispense:  90 tablet    Refill:  1   DULoxetine (CYMBALTA) 60 MG capsule    Sig: Take 1 capsule (60 mg total) by mouth daily.    Dispense:  90 capsule    Refill:  1   metoprolol succinate (TOPROL-XL) 25 MG 24 hr tablet    Sig: Take 0.5 tablets (12.5 mg total) by mouth daily.    Dispense:  45 tablet    Refill:  1   pantoprazole (PROTONIX) 40 MG tablet    Sig: Take 1 tablet (40 mg total) by mouth daily.    Dispense:  90 tablet    Refill:  1   clopidogrel (PLAVIX) 75 MG tablet    Sig: Take 1 tablet (75 mg total) by mouth daily.     Dispense:  90 tablet    Refill:  3   Semaglutide, 1 MG/DOSE, 4 MG/3ML SOPN    Sig: Inject 1 mg as directed once a week.    Dispense:  3 mL    Refill:  5   pregabalin (LYRICA) 150 MG capsule    Sig: Take 1 capsule (150 mg total) by mouth 2 (two) times daily.    Dispense:  180 capsule    Refill:  1    DC other Lyrica scripts please.    Referral Orders  No referral(s) requested today     Note is dictated utilizing voice recognition software. Although note has been proof read prior to signing, occasional typographical errors still can be missed. If any questions arise, please do not hesitate to call for verification.  Electronically signed by: Howard Pouch, DO [place]

## 2022-04-07 NOTE — Patient Instructions (Signed)
No follow-ups on file.        Great to see you today.  I have refilled the medication(s) we provide.   If labs were collected, we will inform you of lab results once received either by echart message or telephone call.   - echart message- for normal results that have been seen by the patient already.   - telephone call: abnormal results or if patient has not viewed results in their echart.  

## 2022-04-07 NOTE — Addendum Note (Signed)
Addended by: Howard Pouch A on: 04/07/2022 05:30 PM   Modules accepted: Orders

## 2022-06-04 ENCOUNTER — Encounter: Payer: Self-pay | Admitting: Family Medicine

## 2022-06-04 DIAGNOSIS — I2510 Atherosclerotic heart disease of native coronary artery without angina pectoris: Secondary | ICD-10-CM

## 2022-06-09 ENCOUNTER — Ambulatory Visit (INDEPENDENT_AMBULATORY_CARE_PROVIDER_SITE_OTHER): Payer: Medicare HMO

## 2022-06-09 DIAGNOSIS — Z0000 Encounter for general adult medical examination without abnormal findings: Secondary | ICD-10-CM | POA: Diagnosis not present

## 2022-06-09 NOTE — Patient Instructions (Signed)
Juan Stanton , Thank you for taking time to come for your Medicare Wellness Visit. I appreciate your ongoing commitment to your health goals. Please review the following plan we discussed and let me know if I can assist you in the future.   Screening recommendations/referrals: Colonoscopy: done 08/14/21 repeat every 3 years  Recommended yearly ophthalmology/optometry visit for glaucoma screening and checkup Recommended yearly dental visit for hygiene and checkup  Vaccinations: Influenza vaccine: done 06/12/21 repeat every year  Pneumococcal vaccine: Up to date Tdap vaccine: done 10/16/12 repeat every 10 years  Shingles vaccine: completed 9/16, 08/14/19   Covid-19: completed 2/26, 3/19, 07/15/20 & 10/13/21  Advanced directives: Please bring a copy of your health care power of attorney and living will to the office at your convenience.  Conditions/risks identified: none at this time informed of silver sneakers   Next appointment: Follow up in one year for your annual wellness visit.   Preventive Care 74 Years and Older, Male Preventive care refers to lifestyle choices and visits with your health care provider that can promote health and wellness. What does preventive care include? A yearly physical exam. This is also called an annual well check. Dental exams once or twice a year. Routine eye exams. Ask your health care provider how often you should have your eyes checked. Personal lifestyle choices, including: Daily care of your teeth and gums. Regular physical activity. Eating a healthy diet. Avoiding tobacco and drug use. Limiting alcohol use. Practicing safe sex. Taking low doses of aspirin every day. Taking vitamin and mineral supplements as recommended by your health care provider. What happens during an annual well check? The services and screenings done by your health care provider during your annual well check will depend on your age, overall health, lifestyle risk factors,  and family history of disease. Counseling  Your health care provider may ask you questions about your: Alcohol use. Tobacco use. Drug use. Emotional well-being. Home and relationship well-being. Sexual activity. Eating habits. History of falls. Memory and ability to understand (cognition). Work and work Statistician. Screening  You may have the following tests or measurements: Height, weight, and BMI. Blood pressure. Lipid and cholesterol levels. These may be checked every 5 years, or more frequently if you are over 74 years old. Skin check. Lung cancer screening. You may have this screening every year starting at age 74 if you have a 30-pack-year history of smoking and currently smoke or have quit within the past 15 years. Fecal occult blood test (FOBT) of the stool. You may have this test every year starting at age 74. Flexible sigmoidoscopy or colonoscopy. You may have a sigmoidoscopy every 5 years or a colonoscopy every 10 years starting at age 74. Prostate cancer screening. Recommendations will vary depending on your family history and other risks. Hepatitis C blood test. Hepatitis B blood test. Sexually transmitted disease (STD) testing. Diabetes screening. This is done by checking your blood sugar (glucose) after you have not eaten for a while (fasting). You may have this done every 1-3 years. Abdominal aortic aneurysm (AAA) screening. You may need this if you are a current or former smoker. Osteoporosis. You may be screened starting at age 74 if you are at high risk. if you are at high risk. Talk with your health care provider about your test results, treatment options, and if necessary, the need for more tests. Vaccines  Your health care provider may recommend certain vaccines, such as: Influenza vaccine. This is recommended every year. Tetanus, diphtheria, and acellular pertussis (Tdap, Td) vaccine.  You may need a Td booster every 10 years. Zoster vaccine. You may need this after age  74. Pneumococcal 13-valent conjugate (PCV13) vaccine. One dose is recommended after age 74. Pneumococcal polysaccharide (PPSV23) vaccine. One dose is recommended after age 74. Talk to your health care provider about which screenings and vaccines you need and how often you need them. This information is not intended to replace advice given to you by your health care provider. Make sure you discuss any questions you have with your health care provider. Document Released: 10/10/2015 Document Revised: 06/02/2016 Document Reviewed: 07/15/2015 Elsevier Interactive Patient Education  2017 [place] Prevention in the Home Falls can cause injuries. They can happen to people of all ages. There are many things you can do to make your home safe and to help prevent falls. What can I do on the outside of my home? Regularly fix the edges of walkways and driveways and fix any cracks. Remove anything that might make you trip as you walk through a door, such as a raised step or threshold. Trim any bushes or trees on the path to your home. Use bright outdoor lighting. Clear any walking paths of anything that might make someone trip, such as rocks or tools. Regularly check to see if handrails are loose or broken. Make sure that both sides of any steps have handrails. Any raised decks and porches should have guardrails on the edges. Have any leaves, snow, or ice cleared regularly. Use sand or salt on walking paths during winter. Clean up any spills in your garage right away. This includes oil or grease spills. What can I do in the bathroom? Use night lights. Install grab bars by the toilet and in the tub and shower. Do not use towel bars as grab bars. Use non-skid mats or decals in the tub or shower. If you need to sit down in the shower, use a plastic, non-slip stool. Keep the floor dry. Clean up any water that spills on the floor as soon as it happens. Remove soap buildup in the tub or shower  regularly. Attach bath mats securely with double-sided non-slip rug tape. Do not have throw rugs and other things on the floor that can make you trip. What can I do in the bedroom? Use night lights. Make sure that you have a light by your bed that is easy to reach. Do not use any sheets or blankets that are too big for your bed. They should not hang down onto the floor. Have a firm chair that has side arms. You can use this for support while you get dressed. Do not have throw rugs and other things on the floor that can make you trip. What can I do in the kitchen? Clean up any spills right away. Avoid walking on wet floors. Keep items that you use a lot in easy-to-reach places. If you need to reach something above you, use a strong step stool that has a grab bar. Keep electrical cords out of the way. Do not use floor polish or wax that makes floors slippery. If you must use wax, use non-skid floor wax. Do not have throw rugs and other things on the floor that can make you trip. What can I do with my stairs? Do not leave any items on the stairs. Make sure that there are handrails on both sides of the stairs and use them. Fix handrails that are broken or loose. Make sure that handrails are as long as  the stairways. Check any carpeting to make sure that it is firmly attached to the stairs. Fix any carpet that is loose or worn. Avoid having throw rugs at the top or bottom of the stairs. If you do have throw rugs, attach them to the floor with carpet tape. Make sure that you have a light switch at the top of the stairs and the bottom of the stairs. If you do not have them, ask someone to add them for you. What else can I do to help prevent falls? Wear shoes that: Do not have high heels. Have rubber bottoms. Are comfortable and fit you well. Are closed at the toe. Do not wear sandals. If you use a stepladder: Make sure that it is fully opened. Do not climb a closed stepladder. Make sure that  both sides of the stepladder are locked into place. Ask someone to hold it for you, if possible. Clearly mark and make sure that you can see: Any grab bars or handrails. First and last steps. Where the edge of each step is. Use tools that help you move around (mobility aids) if they are needed. These include: Canes. Walkers. Scooters. Crutches. Turn on the lights when you go into a dark area. Replace any light bulbs as soon as they burn out. Set up your furniture so you have a clear path. Avoid moving your furniture around. If any of your floors are uneven, fix them. If there are any pets around you, be aware of where they are. Review your medicines with your doctor. Some medicines can make you feel dizzy. This can increase your chance of falling. Ask your doctor what other things that you can do to help prevent falls. This information is not intended to replace advice given to you by your health care provider. Make sure you discuss any questions you have with your health care provider. Document Released: 07/10/2009 Document Revised: 02/19/2016 Document Reviewed: 10/18/2014 Elsevier Interactive Patient Education  2017 [employer].

## 2022-06-09 NOTE — Progress Notes (Signed)
Virtual Visit via Telephone Note  I connected with  Juan Stanton on 06/09/22 at  9:45 AM EDT by telephone and verified that I am speaking with the correct person using two identifiers.  Medicare Annual Wellness visit completed telephonically due to Covid-19 pandemic.   Persons participating in this call: This Health Coach and this patient.   Location: Patient: home  Provider: office    I discussed the limitations, risks, security and privacy concerns of performing an evaluation and management service by telephone and the availability of in person appointments. The patient expressed understanding and agreed to proceed.  Unable to perform video visit due to video visit attempted and failed and/or patient does not have video capability.   Some vital signs may be absent or patient reported.   Juan Brace, LPN   Subjective:   Juan Stanton is a 76 y.o. male who presents for Medicare Annual/Subsequent preventive examination.  Review of Systems     Cardiac Risk Factors include: advanced age (>55mn, >>74women);diabetes mellitus;hypertension;male gender;obesity (BMI >30kg/m2)     Objective:    There were no vitals filed for this visit. There is no height or weight on file to calculate BMI.     06/09/2022    9:51 AM 06/03/2021   10:50 AM 09/22/2016    8:13 AM 09/21/2016    8:49 AM  Advanced Directives  Does Patient Have a Medical Advance Directive? Yes No Yes Yes  Type of [place]   Living will  [place] of [place]? No - copy requested     Would patient like information on creating a medical advance directive?  No - Patient declined      Current Medications (verified) Outpatient Encounter Medications as of 06/09/2022  Medication Sig   acetaminophen (TYLENOL) 650 MG CR tablet Take by mouth.   allopurinol (ZYLOPRIM) 300 MG tablet Take 1 tablet (300 mg total) by mouth daily.   aspirin 81 MG  chewable tablet Chew by mouth daily.   atorvastatin (LIPITOR) 80 MG tablet Take 1 tablet (80 mg total) by mouth daily.   bimatoprost (LUMIGAN) 0.03 % ophthalmic solution 1 drop at bedtime.   brimonidine-timolol (COMBIGAN) 0.2-0.5 % ophthalmic solution Place 1 drop into both eyes every 12 (twelve) hours.   candesartan-hydrochlorothiazide (ATACAND HCT) 16-12.5 MG tablet Take 1 tablet by mouth daily.   clopidogrel (PLAVIX) 75 MG tablet Take 1 tablet (75 mg total) by mouth daily.   Coenzyme Q10 (CO Q-10) 100 MG CAPS    DULoxetine (CYMBALTA) 60 MG capsule Take 1 capsule (60 mg total) by mouth daily.   fluticasone (FLONASE) 50 MCG/ACT nasal spray Place 1 spray into both nostrils daily.   levothyroxine (SYNTHROID) 150 MCG tablet Take 1 tablet (150 mcg total) by mouth daily.   metoprolol succinate (TOPROL-XL) 25 MG 24 hr tablet Take 0.5 tablets (12.5 mg total) by mouth daily.   Multiple Vitamin (MULTIVITAMIN) capsule Take 1 capsule by mouth daily.   nitroGLYCERIN (NITROSTAT) 0.4 MG SL tablet Place under the tongue.   pantoprazole (PROTONIX) 40 MG tablet Take 1 tablet (40 mg total) by mouth daily.   polyethylene glycol powder (GLYCOLAX/MIRALAX) 17 GM/SCOOP powder Take by mouth.   pregabalin (LYRICA) 150 MG capsule Take 1 capsule (150 mg total) by mouth 2 (two) times daily.   Semaglutide, 1 MG/DOSE, 4 MG/3ML SOPN Inject 1 mg as directed once a week.   No facility-administered encounter medications on file as of 06/09/2022.  Allergies (verified) Celebrex [celecoxib] and Iodine   History: Past Medical History:  Diagnosis Date   Acute pain of left shoulder 07/20/2021   improved with PT   Allergy 1990   Arthritis 2010   Bilateral hip pain 07/20/2021   bursitis - PT helped   Colon polyps    Coronary artery disease    Depression 03/29/2020   Formatting of this note might be different from the original. Buproprion XL 150 mg qd   Diabetes mellitus type 2 in obese ([place])    Diverticulitis     sigmoid   Fatty liver    noted on renal ultrasound 12/2021   GERD (gastroesophageal reflux disease)    Glaucoma    Gout    Gross hematuria 12/2021   renal ultrasound normal   Heart disease    Hypercholesteremia    Hypertension    Hypothyroid    Kidney stones 09/20/2016   x2, 1888 and 2070   Myocardial infarction [facility])    Neuromuscular disorder ([place]) 1995   Neuropathy    Sleep apnea    Dr. Reece Levy   Stroke [facility]) 2002   TIA   TIA (transient ischemic attack)    Torn meniscus 01/29/2006   Left knee.   Type 2 diabetes mellitus with hyperlipidemia [facility])    Past Surgical History:  Procedure Laterality Date   CARDIAC CATHETERIZATION  02/16/2012   [facility]   CARPAL TUNNEL RELEASE Bilateral    Left-01/15/2016.  Right-03/11/2016.   COLONOSCOPY W/ POLYPECTOMY  12/20/2016   4 mm sessile tubular adenoma w/multifoci high grade dysplasia '@65'$  cm   CORONARY ANGIOPLASTY WITH STENT PLACEMENT  09/16/2010   MI-cardiac catheterization-1 stent-OM.  [facility]   EYE SURGERY  2017   KNEE ARTHROSCOPY Left 01/29/2006   Torn meniscus   LESION EXCISION Left 04/01/2009   Benign.  [facility]   [place]     Family History  Problem Relation Age of Onset   Depression Mother    Diabetes Mother    Hyperlipidemia Mother    Hypertension Mother    Hypertension Father    Depression Sister    Hyperlipidemia Sister    Hypertension Sister    Alcohol abuse Brother    Diabetes Brother    Heart attack Brother    Heart disease Brother    Hyperlipidemia Brother    Hypertension Brother    Breast cancer Maternal Grandmother    Kidney disease Maternal Grandfather    Stroke Paternal Grandmother    Stroke Paternal Grandfather    Heart disease Brother    Hyperlipidemia Brother    Hypertension Brother    Social History   Socioeconomic History   Marital status: Married    Spouse name: Not on file   Number of children: Not on file   Years of education:  Not on file   Highest education level: Master's degree (e.g., MA, MS, MEng, MEd, MSW, MBA)  Occupational History   Not on file  Tobacco Use   Smoking status: Former    Packs/day: 3.00    Years: 5.00    Total pack years: 15.00    Types: Cigarettes    Quit date: 02/11/1980    Years since quitting: 42.3   Smokeless tobacco: Never  Vaping Use   Vaping Use: Never used  Substance and Sexual Activity   Alcohol use: Yes    Alcohol/week: 2.0 standard drinks of alcohol    Types: 2 Cans of beer per week  Drug use: No   Sexual activity: Not Currently  Other Topics Concern   Not on file  Social History Narrative   Marital status/children/pets: Married.   Education/employment: Masters degree.  Retired from [employer]   Safety:      -smoke alarm in the home:Yes     - wears seatbelt: Yes      Social Determinants of Health   Financial Resource Strain: Low Risk  (06/09/2022)   Overall Financial Resource Strain (CARDIA)    Difficulty of Paying Living Expenses: Not hard at all  Food Insecurity: No Food Insecurity (06/09/2022)   Hunger Vital Sign    Worried About Running Out of Food in the Last Year: Never true    Ran Out of Food in the Last Year: Never true  Transportation Needs: No Transportation Needs (06/09/2022)   PRAPARE - Hydrologist (Medical): No    Lack of Transportation (Non-Medical): No  Physical Activity: Inactive (06/09/2022)   Exercise Vital Sign    Days of Exercise per Week: 0 days    Minutes of Exercise per Session: 0 min  Stress: Stress Concern Present (06/09/2022)   [place]    Feeling of Stress : To some extent  Social Connections: Moderately Integrated (06/09/2022)   Social Connection and Isolation Panel [NHANES]    Frequency of Communication with Friends and Family: Once a week    Frequency of Social Gatherings with Friends and Family: More than three  times a week    Attends Religious Services: Never    Marine scientist or Organizations: Yes    Attends Music therapist: 1 to 4 times per year    Marital Status: Married    Tobacco Counseling Counseling given: Not Answered   Clinical Intake:  Pre-visit preparation completed: Yes  Pain : No/denies pain     BMI - recorded: 42.15 Nutritional Status: BMI > 30  Obese Nutritional Risks: None Diabetes: Yes CBG done?: No Did pt. bring in CBG monitor from home?: No  How often do you need to have someone help you when you read instructions, pamphlets, or other written materials from your doctor or pharmacy?: 1 - Never  Diabetic?Nutrition Risk Assessment:  Has the patient had any N/V/D within the last 2 months?  No  Does the patient have any non-healing wounds?  No  Has the patient had any unintentional weight loss or weight gain?  No   Diabetes:  Is the patient diabetic?  Yes  If diabetic, was a CBG obtained today?  No  Did the patient bring in their glucometer from home?  No  How often do you monitor your CBG's? N/A.   Financial Strains and Diabetes Management:  Are you having any financial strains with the device, your supplies or your medication? No .  Does the patient want to be seen by Chronic Care Management for management of their diabetes?  No  Would the patient like to be referred to a Nutritionist or for Diabetic Management?  No   Diabetic Exams:  Diabetic Eye Exam: Completed 06/08/21 scheduled appt 06/11/22 Diabetic Foot Exam: Completed 04/07/22   Interpreter Needed?: No  Information entered by :: Charlott Rakes, LPN   Activities of Daily Living    06/09/2022    9:51 AM  In your present state of health, do you have any difficulty performing the following activities:  Hearing? 1  Comment wears hearing aids  Vision? 0  Difficulty concentrating or making decisions? 0  Walking or climbing stairs? 0  Dressing or bathing? 0  Doing  errands, shopping? 0  Preparing Food and eating ? N  Using the Toilet? N  In the past six months, have you accidently leaked urine? N  Do you have problems with loss of bowel control? N  Managing your Medications? N  Managing your Finances? N  Housekeeping or managing your Housekeeping? N    Patient Care Team: Ma Hillock, DO as PCP - General (Family Medicine) Sharyne Peach, MD as Consulting Physician (Ophthalmology) Atilano Median, Gayleen Orem, MD as Referring Physician (Cardiology) Ulla Gallo, MD as Consulting Physician (Dermatology) Criselda Peaches, DPM as Consulting Physician (Podiatry) Elmarie Mainland, MD as Consulting Physician (Sleep Medicine)  Indicate any recent Medical Services you may have received from other than [facility] providers in the past year (date may be approximate).     Assessment:   This is a routine wellness examination for [place].  Hearing/Vision screen Hearing Screening - Comments:: Pt wears hearing aids  Vision Screening - Comments:: Pt will follow up with dr Delman Cheadle 06/11/22  Dietary issues and exercise activities discussed: Current Exercise Habits: The patient does not participate in regular exercise at present   Goals Addressed             This Visit's Progress    Patient Stated       None at this time        Depression Screen    06/09/2022    9:49 AM 07/20/2021    9:34 AM 06/03/2021   10:56 AM 03/03/2021    9:17 AM  PHQ 2/9 Scores  PHQ - 2 Score '2 2 1 1  '$ PHQ- 9 Score  4  3    Fall Risk    06/09/2022    9:51 AM 10/20/2021    7:00 PM 07/20/2021    9:29 AM 06/03/2021   10:54 AM 03/03/2021    9:07 AM  Fall Risk   Falls in the past year? 0 1 0 0 0  Number falls in past yr: 0 0 0 0 0  Injury with Fall? 0 0 0 0 0  Risk for fall due to : No Fall Risks;Impaired vision;Impaired balance/gait  No Fall Risks    Risk for fall due to: Comment related to neuropathy      Follow up Falls prevention discussed  Falls evaluation completed Falls  evaluation completed;Falls prevention discussed Falls evaluation completed    FALL RISK PREVENTION PERTAINING TO THE HOME:  Any stairs in or around the home? Yes  If so, are there any without handrails? No  Home free of loose throw rugs in walkways, pet beds, electrical cords, etc? Yes  Adequate lighting in your home to reduce risk of falls? Yes   ASSISTIVE DEVICES UTILIZED TO PREVENT FALLS:  Life alert? No  Use of a cane, walker or w/c? No  Grab bars in the bathroom? Yes  Shower chair or bench in shower? No  Elevated toilet seat or a handicapped toilet? No   TIMED UP AND GO:  Was the test performed? No .   Cognitive Function:        06/09/2022    9:53 AM  6CIT Screen  What Year? 0 points  What month? 0 points  What time? 0 points  Count back from 20 0 points  Months in reverse 0 points  Repeat phrase 0 points  Total Score 0 points  Immunizations Immunization History  Administered Date(s) Administered   Fluad Quad(high Dose 65+) 06/12/2021   Influenza Split 08/09/2011, 11/02/2012, 06/28/2013, 07/16/2013   Influenza, High Dose Seasonal PF 06/18/2016, 08/14/2018   Influenza, Seasonal, Injecte, Preservative Fre 09/30/2015, 08/08/2017, 06/13/2019   PFIZER(Purple Top)SARS-COV-2 Vaccination 11/23/2019, 12/14/2019, 07/15/2020   Pfizer Covid-19 Vaccine Bivalent Booster 64yr & up 10/13/2021   Pneumococcal Conjugate-13 09/30/2015   Pneumococcal Polysaccharide-23 07/16/2013   Tdap 10/16/2012   Zoster Recombinat (Shingrix) 06/13/2019, 08/14/2019    TDAP status: Up to date  Flu Vaccine status: Up to date  Pneumococcal vaccine status: Up to date  Covid-19 vaccine status: Completed vaccines  Qualifies for Shingles Vaccine? Yes   Zostavax completed Yes   Shingrix Completed?: Yes  Screening Tests Health Maintenance  Topic Date Due   INFLUENZA VACCINE  04/27/2022   OPHTHALMOLOGY EXAM  06/08/2022   COVID-19 Vaccine (5 - Pfizer series) 04/23/2028 (Originally  02/10/2022)   HEMOGLOBIN A1C  10/08/2022   TETANUS/TDAP  10/16/2022   FOOT EXAM  04/08/2023   COLONOSCOPY (Pts 45-465yrInsurance coverage will need to be confirmed)  08/14/2024   Pneumonia Vaccine 6522Years old  Completed   Hepatitis C Screening  Completed   Zoster Vaccines- Shingrix  Completed   HPV VACCINES  Aged Out    Health Maintenance  Health Maintenance Due  Topic Date Due   INFLUENZA VACCINE  04/27/2022   OPHTHALMOLOGY EXAM  06/08/2022    Colorectal cancer screening: Type of screening: Colonoscopy. Completed 08/14/21. Repeat every 3 years   Additional Screening:  Hepatitis C Screening:  Completed 07/20/21  Vision Screening: Recommended annual ophthalmology exams for early detection of glaucoma and other disorders of the eye. Is the patient up to date with their annual eye exam?  Yes  Who is the provider or what is the name of the office in which the patient attends annual eye exams? Dr GoDelman CheadleIf pt is not established with a provider, would they like to be referred to a provider to establish care? No .   Dental Screening: Recommended annual dental exams for proper oral hygiene  Community Resource Referral / Chronic Care Management: CRR required this visit?  No   CCM required this visit?  No      Plan:     I have personally reviewed and noted the following in the patient's chart:   Medical and social history Use of alcohol, tobacco or illicit drugs  Current medications and supplements including opioid prescriptions. Patient is not currently taking opioid prescriptions. Functional ability and status Nutritional status Physical activity Advanced directives List of other physicians Hospitalizations, surgeries, and ER visits in previous 12 months Vitals Screenings to include cognitive, depression, and falls Referrals and appointments  In addition, I have reviewed and discussed with patient certain preventive protocols, quality metrics, and best practice  recommendations. A written personalized care plan for preventive services as well as general preventive health recommendations were provided to patient.     TiWillette BraceLPN   05/31/84/4627 Nurse Notes: none

## 2022-06-11 LAB — HM DIABETES EYE EXAM

## 2022-07-05 ENCOUNTER — Other Ambulatory Visit: Payer: Self-pay

## 2022-07-07 ENCOUNTER — Other Ambulatory Visit: Payer: Self-pay

## 2022-07-07 NOTE — Telephone Encounter (Addendum)
RF request for Nitroglycerin LOV: 04/07/22 Next ov:  09/07/22 Last written: 05/27/20  Please fill, if appropriate.

## 2022-07-08 ENCOUNTER — Ambulatory Visit: Payer: Medicare HMO | Admitting: Podiatry

## 2022-07-09 MED ORDER — NITROGLYCERIN 0.4 MG SL SUBL: 0.4000 mg | SUBLINGUAL_TABLET | SUBLINGUAL | 0 refills | Status: DC | PRN

## 2022-07-09 NOTE — Telephone Encounter (Signed)
Refilled nitro for him.

## 2022-07-12 ENCOUNTER — Encounter (HOSPITAL_BASED_OUTPATIENT_CLINIC_OR_DEPARTMENT_OTHER): Payer: Self-pay | Admitting: Cardiology

## 2022-07-12 ENCOUNTER — Ambulatory Visit (HOSPITAL_BASED_OUTPATIENT_CLINIC_OR_DEPARTMENT_OTHER): Payer: Medicare HMO | Admitting: Cardiology

## 2022-07-12 VITALS — BP 112/74 | HR 72 | Ht 68.0 in | Wt 280.6 lb

## 2022-07-12 DIAGNOSIS — Z8673 Personal history of transient ischemic attack (TIA), and cerebral infarction without residual deficits: Secondary | ICD-10-CM

## 2022-07-12 DIAGNOSIS — Z955 Presence of coronary angioplasty implant and graft: Secondary | ICD-10-CM | POA: Diagnosis not present

## 2022-07-12 DIAGNOSIS — Z8249 Family history of ischemic heart disease and other diseases of the circulatory system: Secondary | ICD-10-CM

## 2022-07-12 DIAGNOSIS — G4733 Obstructive sleep apnea (adult) (pediatric): Secondary | ICD-10-CM

## 2022-07-12 DIAGNOSIS — I2510 Atherosclerotic heart disease of native coronary artery without angina pectoris: Secondary | ICD-10-CM | POA: Diagnosis not present

## 2022-07-12 DIAGNOSIS — I10 Essential (primary) hypertension: Secondary | ICD-10-CM

## 2022-07-12 DIAGNOSIS — E782 Mixed hyperlipidemia: Secondary | ICD-10-CM

## 2022-07-12 DIAGNOSIS — E1159 Type 2 diabetes mellitus with other circulatory complications: Secondary | ICD-10-CM

## 2022-07-12 MED ORDER — NITROGLYCERIN 0.4 MG SL SUBL: 0.4000 mg | SUBLINGUAL_TABLET | SUBLINGUAL | 5 refills | Status: AC | PRN

## 2022-07-12 NOTE — Telephone Encounter (Signed)
Patient Questionnaires

## 2022-07-12 NOTE — Patient Instructions (Signed)
Medication Instructions:  STOP: Metoprolol   *If you need a refill on your cardiac medications before your next appointment, please call your pharmacy*   Lab Work: None ordered today   Testing/Procedures: None ordered today   Follow-Up: At [facility], you and your health needs are our priority.  As part of our continuing mission to provide you with exceptional heart care, we have created designated Provider Care Teams.  These Care Teams include your primary Cardiologist (physician) and Advanced Practice Providers (APPs -  Physician Assistants and Nurse Practitioners) who all work together to provide you with the care you need, when you need it.  We recommend signing up for the patient portal called "MyChart".  Sign up information is provided on this After Visit Summary.  MyChart is used to connect with patients for Virtual Visits (Telemedicine).  Patients are able to view lab/test results, encounter notes, upcoming appointments, etc.  Non-urgent messages can be sent to your provider as well.   To learn more about what you can do with MyChart, go to [email or web address].    Your next appointment:   1 year(s)  The format for your next appointment:   In Person  Provider:   Bridgette Christopher, MD         

## 2022-07-12 NOTE — Progress Notes (Signed)
Cardiology Office Note:    Date:  07/12/2022   ID:  Juan Stanton, DOB 14-Dec-1945, MRN [id number]  PCP:  Ma Hillock, DO  Cardiologist:  Buford Dresser, MD  Referring MD: Ma Hillock, DO   CC: new patient evaluation for atherosclerosis  History of Present Illness:    Juan Stanton is a 76 y.o. male with a hx of TIA (2002), myocardial infarction (2011), coronary artery disease s/p PCI x2, hypertension, hyperlipidemia, type 2 diabetes mellitus, GERD, and sleep apnea who is seen as a new consult at the request of Kuneff, Renee A, DO for the evaluation and management of atherosclerosis.  Today: Previously followed by Dr. Atilano Median, last seen 09/2021. Moved here about 2 years ago, last cath was for abnormal stress test in 2019 (stent as above). Did not react well to lexiscan in the past. Has had difficulty getting his heart rate to target on the treadmill. He states he had a prior heart attack in 2011. He had a stent put in because of a 100% blockage. In 2019 he had another stent put in because of a 70% blockage in his right coronary artery. He reports that back in 2011 around his heart attack was the last time he felt chest tightness.   Based on his stent cards: Stents placed in [place], [place], Dr. Carmela Rima, [facility] 2011: stent placed OM, 100% blockage. Xience 2.25 x 18 mm 2019: stent placed RCA, RCA. Emerge 2 x 12 mm  About two months ago, he experienced a strange feeling in his chest while he was sitting reading. It was a fluttering sensation in his chest that last for a few seconds. He endorses that he feels this every two years or so.   Additionally, he mentions frequent bilateral LE edema, always worse in his LLE.   He reports having hematuria about a month or two ago and he saw a urologist to help get it resolved.   Of note, he endorses that he bruises easily.   He checks his blood pressure at home every 2-3 days. He states that he has been  seeing readings around his in-clinic reading today (112/74) with taking 1/2 his metoprolol.   He states that about a week ago he was removing difficult fence posts for about 15-20 minutes. He worked up a sweat and became mildly short of breath, but this resolved easily when he stopped.  He uses a CPAP for his sleep apnea and tolerates it well.  He denies any chest pain. No lightheadedness, headaches, syncope, orthopnea, or PND.  Past Medical History:  Diagnosis Date   Acute pain of left shoulder 07/20/2021   improved with PT   Allergy 1990   Arthritis 2010   Bilateral hip pain 07/20/2021   bursitis - PT helped   Colon polyps    Coronary artery disease    Depression 03/29/2020   Formatting of this note might be different from the original. Buproprion XL 150 mg qd   Diabetes mellitus type 2 in obese ([place])    Diverticulitis    sigmoid   Fatty liver    noted on renal ultrasound 12/2021   GERD (gastroesophageal reflux disease)    Glaucoma    Gout    Gross hematuria 12/2021   renal ultrasound normal   Heart disease    Hypercholesteremia    Hypertension    Hypothyroid    Kidney stones 09/20/2016   x2, 1888 and 2070   Myocardial infarction ([place])  Neuromuscular disorder ([place]) 1995   Neuropathy    Sleep apnea    Dr. Reece Levy   Stroke [facility]) 2002   TIA   TIA (transient ischemic attack)    Torn meniscus 01/29/2006   Left knee.   Type 2 diabetes mellitus with hyperlipidemia [facility])     Past Surgical History:  Procedure Laterality Date   CARDIAC CATHETERIZATION  02/16/2012   [facility]   CARPAL TUNNEL RELEASE Bilateral    Left-01/15/2016.  Right-03/11/2016.   COLONOSCOPY W/ POLYPECTOMY  12/20/2016   4 mm sessile tubular adenoma w/multifoci high grade dysplasia '@65'$  cm   CORONARY ANGIOPLASTY WITH STENT PLACEMENT  09/16/2010   MI-cardiac catheterization-1 stent-OM.  [facility]   EYE SURGERY  2017   KNEE ARTHROSCOPY Left 01/29/2006   Torn meniscus    LESION EXCISION Left 04/01/2009   Benign.  [facility]   MOHS SURGERY      Current Medications: Current Outpatient Medications on File Prior to Visit  Medication Sig   acetaminophen (TYLENOL) 650 MG CR tablet Take by mouth.   allopurinol (ZYLOPRIM) 300 MG tablet Take 1 tablet (300 mg total) by mouth daily.   aspirin 81 MG chewable tablet Chew by mouth daily.   atorvastatin (LIPITOR) 80 MG tablet Take 1 tablet (80 mg total) by mouth daily.   bimatoprost (LUMIGAN) 0.03 % ophthalmic solution 1 drop at bedtime.   brimonidine-timolol (COMBIGAN) 0.2-0.5 % ophthalmic solution Place 1 drop into both eyes every 12 (twelve) hours.   candesartan-hydrochlorothiazide (ATACAND HCT) 16-12.5 MG tablet Take 1 tablet by mouth daily.   clopidogrel (PLAVIX) 75 MG tablet Take 1 tablet (75 mg total) by mouth daily.   Coenzyme Q10 (CO Q-10) 100 MG CAPS    DULoxetine (CYMBALTA) 60 MG capsule Take 1 capsule (60 mg total) by mouth daily.   fluticasone (FLONASE) 50 MCG/ACT nasal spray Place 1 spray into both nostrils daily.   levothyroxine (SYNTHROID) 150 MCG tablet Take 1 tablet (150 mcg total) by mouth daily.   metoprolol succinate (TOPROL-XL) 25 MG 24 hr tablet Take 0.5 tablets (12.5 mg total) by mouth daily.   Multiple Vitamin (MULTIVITAMIN) capsule Take 1 capsule by mouth daily.   nitroGLYCERIN (NITROSTAT) 0.4 MG SL tablet Place 1 tablet (0.4 mg total) under the tongue every 5 (five) minutes as needed for chest pain.   pantoprazole (PROTONIX) 40 MG tablet Take 1 tablet (40 mg total) by mouth daily.   polyethylene glycol powder (GLYCOLAX/MIRALAX) 17 GM/SCOOP powder Take by mouth.   pregabalin (LYRICA) 150 MG capsule Take 1 capsule (150 mg total) by mouth 2 (two) times daily.   Semaglutide, 1 MG/DOSE, 4 MG/3ML SOPN Inject 1 mg as directed once a week.   No current facility-administered medications on file prior to visit.     Allergies:   Celebrex [celecoxib] and Iodine   Social History    Tobacco Use   Smoking status: Former    Packs/day: 3.00    Years: 5.00    Total pack years: 15.00    Types: Cigarettes    Quit date: 02/11/1980    Years since quitting: 42.4   Smokeless tobacco: Never  Vaping Use   Vaping Use: Never used  Substance Use Topics   Alcohol use: Yes    Alcohol/week: 2.0 standard drinks of alcohol    Types: 2 Cans of beer per week   Drug use: No    Family History: family history includes Alcohol abuse in his brother; Breast cancer  in his maternal grandmother; Depression in his mother and sister; Diabetes in his brother and mother; Heart attack in his brother; Heart disease in his brother and brother; Hyperlipidemia in his brother, brother, mother, and sister; Hypertension in his brother, brother, father, mother, and sister; Kidney disease in his maternal grandfather; Stroke in his paternal grandfather and paternal grandmother.  ROS:   Please see the history of present illness.   (+) Occasional chest flutter sensation  (+) Easy bruising (+) Bilateral LE edema Additional pertinent ROS otherwise negative.   EKGs/Labs/Other Studies Reviewed:    The following studies were reviewed today: Based on his stent cards: Stents placed in [place], [place], Dr. Carmela Rima, [facility] 2011: stent placed OM, 100% blockage. Xience 2.25 x 18 mm 2019: stent placed RCA, RCA. Emerge 2 x 12 mm  Cath report 02/16/2018 (Care Everywhere) only comments on intervention The Mid RCA was then treated using balloon and Synergy Stent . SYNERGY Stent RX 2.5 20 RCA Elutin g Stent   EKG:  EKG is personally reviewed.   07/12/22: NSR at 72 bpm with RBBB  Recent Labs: 01/04/2022: Hemoglobin 13.3; Platelets 273 04/07/2022: ALT 35; BUN 19; Creatinine, Ser 0.98; Potassium 4.2; Sodium 140; TSH 3.67  Recent Lipid Panel    Component Value Date/Time   CHOL 140 07/20/2021 1000   TRIG 179.0 (H) 07/20/2021 1000   HDL 37.20 (L) 07/20/2021 1000   CHOLHDL 4 07/20/2021  1000   VLDL 35.8 07/20/2021 1000   LDLCALC 67 07/20/2021 1000    Physical Exam:    VS:  BP 112/74 (BP Location: Left Arm, Patient Position: Sitting, Cuff Size: Large)   Pulse 72   Ht '5\' 8"'$  (1.727 m)   Wt 280 lb 9.6 oz (127.3 kg)   BMI 42.67 kg/m     Wt Readings from Last 3 Encounters:  04/07/22 277 lb 3.2 oz (125.7 kg)  01/04/22 267 lb 9.6 oz (121.4 kg)  12/21/21 269 lb 12.8 oz (122.4 kg)    GEN: Well nourished, well developed in no acute distress HEENT: Normal, moist mucous membranes NECK: No JVD CARDIAC: regular rhythm, normal S1 and S2, no rubs or gallops. No murmur. VASCULAR: Radial and DP pulses 2+ bilaterally. No carotid bruits RESPIRATORY:  Clear to auscultation without rales, wheezing or rhonchi  ABDOMEN: Soft, non-tender, non-distended MUSCULOSKELETAL:  Ambulates independently SKIN: Warm and dry, trivial bilateral LE edema NEUROLOGIC:  Alert and oriented x 3. No focal neuro deficits noted. PSYCHIATRIC:  Normal affect    ASSESSMENT:    1. Coronary artery disease involving native coronary artery of native heart without angina pectoris   2. History of coronary angioplasty with insertion of stent   3. Essential (primary) hypertension   4. Mixed hyperlipidemia   5. Family history of heart disease   6. OSA on CPAP   7. History of TIA (transient ischemic attack)   8. Type 2 diabetes mellitus with other circulatory complication, without long-term current use of insulin (HCC)     PLAN:    Coronary artery disease History of MI 2011 ([place]) s/p PCI History of PCI ([place]) 2019 Family history of heart disease History of TIA Mixed hyperlipidemia -active, denies angina -currently on aspirin 81 mg daily, clopidogrel 75 mg daily, atorvastatin 80 mg daily -refilled SL NG today and reviewed recommendations for use -discussed dropping to single antiplatelet. Suspect guidelines will change to recommending clopidogrel over aspirin. No issues with bleeding, prefers to continue DAPT  for now and readdress at  follow up -last LDL 67, at current goal but suspect he would benefit from more aggressive goal of <55 given diabetes. On max atorvastatin, continue to discuss additional agent  Type II diabetes -on semaglutide -last A1c 5.7  Hypertension -on candesartan-HCTZ 16-12.5 mg daily  OSA -continue CPAP  LE edema: minimal today, but if significantly worsens he will contact me  Cardiac risk counseling and prevention recommendations: -recommend heart healthy/Mediterranean diet, with whole grains, fruits, vegetable, fish, lean meats, nuts, and olive oil. Limit salt. -recommend moderate walking, 3-5 times/week for 30-50 minutes each session. Aim for at least 150 minutes.week. Goal should be pace of 3 miles/hours, or walking 1.5 miles in 30 minutes -recommend avoidance of tobacco products. Avoid excess alcohol.  Plan for follow up: 1 year.   Buford Dresser, MD, PhD, [facility]    Medication Adjustments/Labs and Tests Ordered: Current medicines are reviewed at length with the patient today.  Concerns regarding medicines are outlined above.  Orders Placed This Encounter  Procedures   EKG 12-Lead   Meds ordered this encounter  Medications   nitroGLYCERIN (NITROSTAT) 0.4 MG SL tablet    Sig: Place 1 tablet (0.4 mg total) under the tongue every 5 (five) minutes as needed for chest pain.    Dispense:  30 tablet    Refill:  5   Patient Instructions  Medication Instructions:  STOP: Metoprolol  *If you need a refill on your cardiac medications before your next appointment, please call your pharmacy*   Lab Work: None ordered today   Testing/Procedures: None ordered today   Follow-Up: At [facility], you and your health needs are our priority.  As part of our continuing mission to provide you with exceptional heart care, we have created designated Provider Care Teams.  These Care Teams include your primary Cardiologist  (physician) and Advanced Practice Providers (APPs -  Physician Assistants and Nurse Practitioners) who all work together to provide you with the care you need, when you need it.  We recommend signing up for the patient portal called "MyChart".  Sign up information is provided on this After Visit Summary.  MyChart is used to connect with patients for Virtual Visits (Telemedicine).  Patients are able to view lab/test results, encounter notes, upcoming appointments, etc.  Non-urgent messages can be sent to your provider as well.   To learn more about what you can do with MyChart, go to [email or web address].    Your next appointment:   1 year(s)  The format for your next appointment:   In Person  Provider:   Buford Dresser, MD           I,Breanna Adamick,acting as a scribe for Buford Dresser, MD.,have documented all relevant documentation on the behalf of Buford Dresser, MD,as directed by  Buford Dresser, MD while in the presence of Buford Dresser, MD.  I, Buford Dresser, MD, have reviewed all documentation for this visit. The documentation on 10/17/22 for the exam, diagnosis, procedures, and orders are all accurate and complete.   Signed, Buford Dresser, MD PhD 07/12/2022     [place]

## 2022-07-13 ENCOUNTER — Ambulatory Visit: Payer: Medicare HMO | Admitting: Podiatry

## 2022-07-13 DIAGNOSIS — B351 Tinea unguium: Secondary | ICD-10-CM | POA: Diagnosis not present

## 2022-07-13 DIAGNOSIS — M79674 Pain in right toe(s): Secondary | ICD-10-CM

## 2022-07-13 DIAGNOSIS — M79675 Pain in left toe(s): Secondary | ICD-10-CM

## 2022-07-14 NOTE — Progress Notes (Signed)
  Subjective:  Patient ID: Juan Stanton, male    DOB: 1945-12-12,  MRN: [id number]  Chief Complaint  Patient presents with   Nail Problem    Thick painful toenails, 3 month follow up     76 y.o. male returns with the above complaint. History confirmed with patient.  Still doing well, no new issues.  Blood sugar remains well controlled  Objective:  Physical Exam: warm, good capillary refill, no trophic changes or ulcerative lesions and normal DP and PT pulses.  Absent light touch sensation and protective sensation distally.  He has nail dystrophy and residual onychomycosis of all nails.  Callus present bilateral hallux  Assessment:   1. Pain due to onychomycosis of toenails of both feet       Plan:  Patient was evaluated and treated and all questions answered.  Discussed the etiology and treatment options for the condition in detail with the patient. Educated patient on the topical and oral treatment options for mycotic nails. Recommended debridement of the nails today. Sharp and mechanical debridement performed of all painful and mycotic nails today. Nails debrided in length and thickness using a nail nipper. Discussed treatment options including appropriate shoe gear. Follow up as needed for painful nails.      Return in about 3 months (around 10/13/2022) for at risk diabetic foot care.

## 2022-08-13 ENCOUNTER — Other Ambulatory Visit: Payer: Self-pay | Admitting: Family Medicine

## 2022-09-02 ENCOUNTER — Encounter: Payer: Self-pay | Admitting: Family Medicine

## 2022-09-02 ENCOUNTER — Ambulatory Visit (INDEPENDENT_AMBULATORY_CARE_PROVIDER_SITE_OTHER): Payer: Medicare HMO | Admitting: Family Medicine

## 2022-09-02 VITALS — BP 115/76 | HR 71 | Temp 98.2°F | Ht 68.0 in | Wt 279.0 lb

## 2022-09-02 DIAGNOSIS — R051 Acute cough: Secondary | ICD-10-CM

## 2022-09-02 DIAGNOSIS — B9689 Other specified bacterial agents as the cause of diseases classified elsewhere: Secondary | ICD-10-CM

## 2022-09-02 DIAGNOSIS — J208 Acute bronchitis due to other specified organisms: Secondary | ICD-10-CM | POA: Diagnosis not present

## 2022-09-02 DIAGNOSIS — R0989 Other specified symptoms and signs involving the circulatory and respiratory systems: Secondary | ICD-10-CM | POA: Diagnosis not present

## 2022-09-02 DIAGNOSIS — R0689 Other abnormalities of breathing: Secondary | ICD-10-CM

## 2022-09-02 LAB — POCT INFLUENZA A/B
Influenza A, POC: NEGATIVE
Influenza B, POC: NEGATIVE

## 2022-09-02 LAB — POC COVID19 BINAXNOW: SARS Coronavirus 2 Ag: NEGATIVE

## 2022-09-02 MED ORDER — DOXYCYCLINE HYCLATE 100 MG PO TABS: 100.0000 mg | ORAL_TABLET | Freq: Two times a day (BID) | ORAL | 0 refills | Status: DC

## 2022-09-02 MED ORDER — METHYLPREDNISOLONE ACETATE 80 MG/ML IJ SUSP
80.0000 mg | Freq: Once | INTRAMUSCULAR | Status: AC
  Administered 2022-09-02: 80 mg via INTRAMUSCULAR

## 2022-09-02 MED ORDER — IPRATROPIUM-ALBUTEROL 0.5-2.5 (3) MG/3ML IN SOLN
3.0000 mL | Freq: Once | RESPIRATORY_TRACT | Status: AC
  Administered 2022-09-02: 3 mL via RESPIRATORY_TRACT

## 2022-09-02 MED ORDER — PREDNISONE 20 MG PO TABS: ORAL_TABLET | ORAL | 0 refills | Status: DC

## 2022-09-02 NOTE — Patient Instructions (Addendum)
Return in about 2 weeks (around 09/16/2022), or if symptoms worsen or fail to improve.        Great to see you today.  I have refilled the medication(s) we provide.   If labs were collected, we will inform you of lab results once received either by echart message or telephone call.   - echart message- for normal results that have been seen by the patient already.   - telephone call: abnormal results or if patient has not viewed results in their echart.

## 2022-09-02 NOTE — Progress Notes (Signed)
Patient ID: Juan Stanton, male  DOB: [birth year], 76 y.o.   MRN: [id number] Patient Care Team    Relationship Specialty Notifications Start End  Ma Hillock, DO PCP - General Family Medicine  03/03/21   Buford Dresser, MD PCP - Cardiology Cardiology  07/12/22   Sharyne Peach, MD Consulting Physician Ophthalmology  03/02/21   Alfonso Patten, MD Referring Physician Cardiology  03/02/21   Ulla Gallo, MD Consulting Physician Dermatology  03/02/21   Criselda Peaches, DPM Consulting Physician Podiatry  03/02/21   Elmarie Mainland, MD Consulting Physician Sleep Medicine  03/02/21     Chief Complaint  Patient presents with   Cough    Pt c/o cough, post nasal drip, fatigue x 1 week    Subjective: Juan Stanton is a 76 y.o.  male present for acute illness. Pt reports 1 week ago he started to experience cough, PND and fatigue. He has had a significant sore throat, with loss of voice. He is feeling more winded today.  He does not have a h/o lung disease, but he did smoker for 8 yrs many decades ago. He has had to use an inhaler when ill in the past.   Past Medical History:  Diagnosis Date   Acute pain of left shoulder 07/20/2021   improved with PT   Allergy 1990   Arthritis 2010   Bilateral hip pain 07/20/2021   bursitis - PT helped   Colon polyps    Coronary artery disease    Depression 03/29/2020   Formatting of this note might be different from the original. Buproprion XL 150 mg qd   Diabetes mellitus type 2 in obese ([place])    Diverticulitis    sigmoid   Fatty liver    noted on renal ultrasound 12/2021   GERD (gastroesophageal reflux disease)    Glaucoma    Gout    Gross hematuria 12/2021   renal ultrasound normal   Heart disease    Hypercholesteremia    Hypertension    Hypothyroid    Kidney stones 09/20/2016   x2, 1888 and 2070   Myocardial infarction [facility])    Neuromuscular disorder ([place]) 1995   Neuropathy    Sleep apnea    Dr. Reece Levy   Stroke [facility])  2002   TIA   TIA (transient ischemic attack)    Torn meniscus 01/29/2006   Left knee.   Type 2 diabetes mellitus with hyperlipidemia (HCC)     Review of Systems  Constitutional:  Positive for malaise/fatigue. Negative for chills and fever.  HENT:  Positive for congestion and sore throat. Negative for ear discharge, ear pain, nosebleeds and sinus pain.   Respiratory:  Positive for cough, sputum production and shortness of breath.   Gastrointestinal:  Negative for abdominal pain, diarrhea, nausea and vomiting.  Neurological:  Negative for dizziness and headaches.   BP 115/76   Pulse 71   Temp 98.2 F (36.8 C) (Oral)   Ht '5\' 8"'$  (1.727 m)   Wt 279 lb (126.6 kg)   SpO2 95%   BMI 42.42 kg/m  Physical Exam Vitals and nursing note reviewed.  Constitutional:      General: He is not in acute distress.    Appearance: Normal appearance. He is not ill-appearing, toxic-appearing or diaphoretic.  HENT:     Head: Normocephalic and atraumatic.  Eyes:     General: No scleral icterus.       Right eye: No discharge.  Left eye: No discharge.     Extraocular Movements: Extraocular movements intact.     Pupils: Pupils are equal, round, and reactive to light.  Skin:    General: Skin is warm and dry.     Coloration: Skin is not jaundiced or pale.     Findings: No rash.  Neurological:     Mental Status: He is alert and oriented to person, place, and time. Mental status is at baseline.  Psychiatric:        Mood and Affect: Mood normal.        Behavior: Behavior normal.        Thought Content: Thought content normal.        Judgment: Judgment normal.    Assessment/plan: Juan Stanton is a 76 y.o. male present for  Acute cough - POCT Influenza A/B - POC COVID-19 BinaxNow  Decreased breath sounds of both lungs/Winded/Acute bacterial bronchitis Decreased lung sounds> duoneb x1 provided today Rest, hydrate.  mucinex (DM if cough) Doxy bid prescribed, take until completed.   Prednisone taper start tomorrow. IM depo medrol 80 today F/U 2 weeks if not improved.   Patient will call them tomorrow if he feels he needs an albuterol inhaler called in for him.   Results for orders placed or performed in visit on 09/02/22 (from the past 24 hour(s))  POC COVID-19 BinaxNow     Status: Normal   Collection Time: 09/02/22 11:37 AM  Result Value Ref Range   SARS Coronavirus 2 Ag Negative Negative  POCT Influenza A/B     Status: Normal   Collection Time: 09/02/22 11:37 AM  Result Value Ref Range   Influenza A, POC Negative Negative   Influenza B, POC Negative Negative    Return in about 2 weeks (around 09/16/2022), or if symptoms worsen or fail to improve.  Orders Placed This Encounter  Procedures   POCT Influenza A/B   POC COVID-19 BinaxNow   Meds ordered this encounter  Medications   predniSONE (DELTASONE) 20 MG tablet    Sig: 60 mg x1d, 40 mg x3d, 20 mg x2d, 10 mg x2d    Dispense:  12 tablet    Refill:  0   doxycycline (VIBRA-TABS) 100 MG tablet    Sig: Take 1 tablet (100 mg total) by mouth 2 (two) times daily.    Dispense:  20 tablet    Refill:  0   methylPREDNISolone acetate (DEPO-MEDROL) injection 80 mg   ipratropium-albuterol (DUONEB) 0.5-2.5 (3) MG/3ML nebulizer solution 3 mL    Referral Orders  No referral(s) requested today     Note is dictated utilizing voice recognition software. Although note has been proof read prior to signing, occasional typographical errors still can be missed. If any questions arise, please do not hesitate to call for verification.  Electronically signed by: Howard Pouch, DO [place]

## 2022-09-06 ENCOUNTER — Other Ambulatory Visit: Payer: Self-pay | Admitting: Family Medicine

## 2022-09-07 ENCOUNTER — Ambulatory Visit: Payer: Medicare HMO | Admitting: Family Medicine

## 2022-09-15 ENCOUNTER — Encounter: Payer: Self-pay | Admitting: Family Medicine

## 2022-09-15 ENCOUNTER — Ambulatory Visit (INDEPENDENT_AMBULATORY_CARE_PROVIDER_SITE_OTHER): Payer: Medicare HMO | Admitting: Family Medicine

## 2022-09-15 ENCOUNTER — Other Ambulatory Visit: Payer: Self-pay | Admitting: Family Medicine

## 2022-09-15 VITALS — BP 100/58 | HR 61 | Temp 98.2°F | Ht 68.0 in | Wt 276.0 lb

## 2022-09-15 DIAGNOSIS — B9689 Other specified bacterial agents as the cause of diseases classified elsewhere: Secondary | ICD-10-CM | POA: Diagnosis not present

## 2022-09-15 DIAGNOSIS — J208 Acute bronchitis due to other specified organisms: Secondary | ICD-10-CM

## 2022-09-15 NOTE — Progress Notes (Signed)
Patient ID: Juan Stanton, male  DOB: 23-Oct-1945, 76 y.o.   MRN: [id number] Patient Care Team    Relationship Specialty Notifications Start End  Ma Hillock, DO PCP - General Family Medicine  03/03/21   Buford Dresser, MD PCP - Cardiology Cardiology  07/12/22   Sharyne Peach, MD Consulting Physician Ophthalmology  03/02/21   Alfonso Patten, MD Referring Physician Cardiology  03/02/21   Ulla Gallo, MD Consulting Physician Dermatology  03/02/21   Criselda Peaches, DPM Consulting Physician Podiatry  03/02/21   Elmarie Mainland, MD Consulting Physician Sleep Medicine  03/02/21     Chief Complaint  Patient presents with   Cough    Pt states sx are     Subjective: Juan Stanton is a 76 y.o.  male present for follow up on acute illness.  Pt reports he is feeling much Improved. He is not back 100% to his normal, but greatly improved.   Prior note: Pt reports 1 week ago he started to experience cough, PND and fatigue. He has had a significant sore throat, with loss of voice. He is feeling more winded today.  He does not have a h/o lung disease, but he did smoker for 8 yrs many decades ago. He has had to use an inhaler when ill in the past.   Past Medical History:  Diagnosis Date   Acute pain of left shoulder 07/20/2021   improved with PT   Allergy 1990   Arthritis 2010   Bilateral hip pain 07/20/2021   bursitis - PT helped   Colon polyps    Coronary artery disease    Depression 03/29/2020   Formatting of this note might be different from the original. Buproprion XL 150 mg qd   Diabetes mellitus type 2 in obese ([place])    Diverticulitis    sigmoid   Fatty liver    noted on renal ultrasound 12/2021   GERD (gastroesophageal reflux disease)    Glaucoma    Gout    Gross hematuria 12/2021   renal ultrasound normal   Heart disease    Hypercholesteremia    Hypertension    Hypothyroid    Kidney stones 09/20/2016   x2, 1888 and 2070   Myocardial infarction [facility])     Neuromuscular disorder ([place]) 1995   Neuropathy    Sleep apnea    Dr. Reece Levy   Stroke [facility]) 2002   TIA   TIA (transient ischemic attack)    Torn meniscus 01/29/2006   Left knee.   Type 2 diabetes mellitus with hyperlipidemia (HCC)     Review of Systems  Constitutional:  Positive for malaise/fatigue. Negative for chills and fever.  HENT:  Negative for congestion, ear discharge, ear pain, nosebleeds, sinus pain and sore throat.   Respiratory:  Negative for cough, sputum production and shortness of breath.   Gastrointestinal:  Negative for abdominal pain, diarrhea, nausea and vomiting.  Neurological:  Negative for dizziness and headaches.   BP (!) 100/58   Pulse 61   Temp 98.2 F (36.8 C) (Oral)   Ht '5\' 8"'$  (1.727 m)   Wt 276 lb (125.2 kg)   SpO2 97%   BMI 41.97 kg/m  Physical Exam Vitals and nursing note reviewed.  Constitutional:      General: He is not in acute distress.    Appearance: Normal appearance. He is not ill-appearing, toxic-appearing or diaphoretic.  HENT:     Head: Normocephalic and atraumatic.  Right Ear: Tympanic membrane normal.     Left Ear: Tympanic membrane normal.     Mouth/Throat:     Mouth: Mucous membranes are moist.  Eyes:     General: No scleral icterus.       Right eye: No discharge.        Left eye: No discharge.     Extraocular Movements: Extraocular movements intact.     Pupils: Pupils are equal, round, and reactive to light.  Cardiovascular:     Rate and Rhythm: Normal rate and regular rhythm.  Pulmonary:     Effort: Pulmonary effort is normal. No respiratory distress.     Breath sounds: Normal breath sounds. No wheezing, rhonchi or rales.  Skin:    General: Skin is warm and dry.     Coloration: Skin is not jaundiced or pale.     Findings: No rash.  Neurological:     Mental Status: He is alert and oriented to person, place, and time. Mental status is at baseline.  Psychiatric:        Mood and Affect: Mood normal.         Behavior: Behavior normal.        Thought Content: Thought content normal.        Judgment: Judgment normal.    Assessment/plan: Juan Stanton is a 76 y.o. male present for  bacterial bronchitis Pts exam is much improved today.  No residual wheezing and air movement is good.  F/u prn   No results found for this or any previous visit (from the past 24 hour(s)).   No follow-ups on file.  No orders of the defined types were placed in this encounter.  No orders of the defined types were placed in this encounter.   Referral Orders  No referral(s) requested today     Note is dictated utilizing voice recognition software. Although note has been proof read prior to signing, occasional typographical errors still can be missed. If any questions arise, please do not hesitate to call for verification.  Electronically signed by: Howard Pouch, DO [place]

## 2022-09-15 NOTE — Patient Instructions (Signed)
No follow-ups on file.        Great to see you today.

## 2022-09-17 ENCOUNTER — Other Ambulatory Visit: Payer: Self-pay | Admitting: Family Medicine

## 2022-09-18 ENCOUNTER — Other Ambulatory Visit: Payer: Self-pay | Admitting: Family Medicine

## 2022-10-02 ENCOUNTER — Other Ambulatory Visit (HOSPITAL_BASED_OUTPATIENT_CLINIC_OR_DEPARTMENT_OTHER): Payer: Self-pay

## 2022-10-02 ENCOUNTER — Emergency Department (HOSPITAL_BASED_OUTPATIENT_CLINIC_OR_DEPARTMENT_OTHER)
Admission: EM | Admit: 2022-10-02 | Discharge: 2022-10-02 | Disposition: A | Discharge status: Home / Self Care | Payer: Medicare HMO | Attending: Emergency Medicine | Admitting: Emergency Medicine

## 2022-10-02 ENCOUNTER — Encounter: Payer: Self-pay | Admitting: Family Medicine

## 2022-10-02 ENCOUNTER — Other Ambulatory Visit: Payer: Self-pay

## 2022-10-02 ENCOUNTER — Encounter (HOSPITAL_BASED_OUTPATIENT_CLINIC_OR_DEPARTMENT_OTHER): Payer: Self-pay

## 2022-10-02 DIAGNOSIS — Z955 Presence of coronary angioplasty implant and graft: Secondary | ICD-10-CM | POA: Diagnosis not present

## 2022-10-02 DIAGNOSIS — I10 Essential (primary) hypertension: Secondary | ICD-10-CM | POA: Insufficient documentation

## 2022-10-02 DIAGNOSIS — E785 Hyperlipidemia, unspecified: Secondary | ICD-10-CM | POA: Diagnosis not present

## 2022-10-02 DIAGNOSIS — E039 Hypothyroidism, unspecified: Secondary | ICD-10-CM | POA: Insufficient documentation

## 2022-10-02 DIAGNOSIS — Z79899 Other long term (current) drug therapy: Secondary | ICD-10-CM | POA: Diagnosis not present

## 2022-10-02 DIAGNOSIS — I2510 Atherosclerotic heart disease of native coronary artery without angina pectoris: Secondary | ICD-10-CM | POA: Insufficient documentation

## 2022-10-02 DIAGNOSIS — Z7902 Long term (current) use of antithrombotics/antiplatelets: Secondary | ICD-10-CM | POA: Insufficient documentation

## 2022-10-02 DIAGNOSIS — U071 COVID-19: Secondary | ICD-10-CM | POA: Diagnosis not present

## 2022-10-02 DIAGNOSIS — E1169 Type 2 diabetes mellitus with other specified complication: Secondary | ICD-10-CM | POA: Insufficient documentation

## 2022-10-02 DIAGNOSIS — R059 Cough, unspecified: Secondary | ICD-10-CM | POA: Diagnosis present

## 2022-10-02 DIAGNOSIS — Z7982 Long term (current) use of aspirin: Secondary | ICD-10-CM | POA: Insufficient documentation

## 2022-10-02 DIAGNOSIS — Z794 Long term (current) use of insulin: Secondary | ICD-10-CM | POA: Diagnosis not present

## 2022-10-02 DIAGNOSIS — E1140 Type 2 diabetes mellitus with diabetic neuropathy, unspecified: Secondary | ICD-10-CM | POA: Diagnosis not present

## 2022-10-02 LAB — RESP PANEL BY RT-PCR (RSV, FLU A&B, COVID)  RVPGX2
Influenza A by PCR: NEGATIVE
Influenza B by PCR: NEGATIVE
Resp Syncytial Virus by PCR: NEGATIVE
SARS Coronavirus 2 by RT PCR: POSITIVE — AB

## 2022-10-02 MED ORDER — BENZONATATE 100 MG PO CAPS
100.0000 mg | ORAL_CAPSULE | Freq: Three times a day (TID) | ORAL | 0 refills | Status: DC
  Filled 2022-10-02: qty 21, 7d supply, fill #0

## 2022-10-02 MED ORDER — NIRMATRELVIR/RITONAVIR (PAXLOVID)TABLET
3.0000 | ORAL_TABLET | Freq: Two times a day (BID) | ORAL | 0 refills | Status: AC
  Filled 2022-10-02: qty 30, 5d supply, fill #0

## 2022-10-02 MED ORDER — NIRMATRELVIR/RITONAVIR (PAXLOVID)TABLET: 3.0000 | ORAL_TABLET | Freq: Two times a day (BID) | ORAL | 0 refills | Status: DC

## 2022-10-02 MED ORDER — BENZONATATE 100 MG PO CAPS: 100.0000 mg | ORAL_CAPSULE | Freq: Three times a day (TID) | ORAL | 0 refills | Status: DC

## 2022-10-02 NOTE — Discharge Instructions (Addendum)
You tested positive for COVID today.  You can try Mucinex or TheraFlu for symptom relief.  I recommend close follow-up with PCP for reevaluation.  Please do not hesitate to return to emergency department if worrisome signs symptoms we discussed become apparent.

## 2022-10-02 NOTE — ED Provider Notes (Signed)
[facility] EMERGENCY DEPT Provider Note   CSN: [id number] Arrival date & time: 10/02/22  0857     History  Chief Complaint  Patient presents with   Generalized Body Aches    Juan Stanton is a 77 y.o. male with a past medical history of arthritis, diabetes, GERD, hypertension presenting to the emergency department for evaluation of flulike symptoms.  Patient reports that he started to have runny nose, nasal congestion, cough, body aches, fatigue, shallow breathing since yesterday.  Patient reports he is living at home with his in-laws who is tested positive for COVID.  He denies any fever however he is been taking 2 Tylenol at home for arthritis.  Denies chest pain or shortness of breath.  Denies nausea, vomiting, diarrhea.  HPI  Past Medical History:  Diagnosis Date   Acute pain of left shoulder 07/20/2021   improved with PT   Allergy 1990   Arthritis 2010   Bilateral hip pain 07/20/2021   bursitis - PT helped   Colon polyps    Coronary artery disease    Depression 03/29/2020   Formatting of this note might be different from the original. Buproprion XL 150 mg qd   Diabetes mellitus type 2 in obese ([place])    Diverticulitis    sigmoid   Fatty liver    noted on renal ultrasound 12/2021   GERD (gastroesophageal reflux disease)    Glaucoma    Gout    Gross hematuria 12/2021   renal ultrasound normal   Heart disease    Hypercholesteremia    Hypertension    Hypothyroid    Kidney stones 09/20/2016   x2, 1888 and 2070   Myocardial infarction [facility])    Neuromuscular disorder ([place]) 1995   Neuropathy    Sleep apnea    Dr. Reece Levy   Stroke [facility]) 2002   TIA   TIA (transient ischemic attack)    Torn meniscus 01/29/2006   Left knee.   Type 2 diabetes mellitus with hyperlipidemia [facility])    Past Surgical History:  Procedure Laterality Date   CARDIAC CATHETERIZATION  02/16/2012   [facility]   CARPAL TUNNEL RELEASE Bilateral    Left-01/15/2016.   Right-03/11/2016.   COLONOSCOPY W/ POLYPECTOMY  12/20/2016   4 mm sessile tubular adenoma w/multifoci high grade dysplasia '@65'$  cm   CORONARY ANGIOPLASTY WITH STENT PLACEMENT  09/16/2010   MI-cardiac catheterization-1 stent-OM.  [facility]   EYE SURGERY  2017   KNEE ARTHROSCOPY Left 01/29/2006   Torn meniscus   LESION EXCISION Left 04/01/2009   Benign.  [facility]   [facility] Medications Prior to Admission medications   Medication Sig Start Date End Date Taking? Authorizing Provider  acetaminophen (TYLENOL) 650 MG CR tablet Take by mouth. 10/03/19   [provider]  allopurinol (ZYLOPRIM) 300 MG tablet TAKE 1 TABLET BY MOUTH DAILY 09/21/22   Kuneff, Renee A, DO  aspirin 81 MG chewable tablet Chew by mouth daily.    [provider]  atorvastatin (LIPITOR) 80 MG tablet TAKE 1 TABLET BY MOUTH DAILY 09/21/22   Kuneff, Renee A, DO  benzonatate (TESSALON) 100 MG capsule Take 1 capsule (100 mg total) by mouth every 8 (eight) hours. 10/02/22   Rex Kras, PA  bimatoprost (LUMIGAN) 0.03 % ophthalmic solution 1 drop at bedtime.    [provider]  brimonidine-timolol (COMBIGAN) 0.2-0.5 % ophthalmic solution Place 1 drop into both eyes every 12 (  twelve) hours.    [provider]  candesartan-hydrochlorothiazide (ATACAND HCT) 16-12.5 MG tablet Take 1 tablet by mouth daily. 04/07/22   Kuneff, Renee A, DO  clopidogrel (PLAVIX) 75 MG tablet Take 1 tablet (75 mg total) by mouth daily. 04/07/22   Kuneff, Renee A, DO  Coenzyme Q10 (CO Q-10) 100 MG CAPS     [provider]  doxycycline (VIBRA-TABS) 100 MG tablet Take 1 tablet (100 mg total) by mouth 2 (two) times daily. 09/02/22   Kuneff, Renee A, DO  DULoxetine (CYMBALTA) 60 MG capsule Take 1 capsule (60 mg total) by mouth daily. 04/07/22   Kuneff, Renee A, DO  fluticasone (FLONASE) 50 MCG/ACT nasal spray Place 1 spray into both nostrils daily. 12/01/21   Kuneff, Renee A, DO   levothyroxine (SYNTHROID) 150 MCG tablet Take 1 tablet (150 mcg total) by mouth daily. 04/07/22   Kuneff, Renee A, DO  Multiple Vitamin (MULTIVITAMIN) capsule Take 1 capsule by mouth daily.    [provider]  nirmatrelvir/ritonavir (PAXLOVID) 20 x 150 MG & 10 x '100MG'$  TABS Take 3 tablets by mouth 2 (two) times daily for 5 days. Take nirmatrelvir (150 mg) two tablets twice daily for 5 days and ritonavir (100 mg) one tablet twice daily for 5 days. 10/02/22 10/07/22  Rex Kras, PA  nitroGLYCERIN (NITROSTAT) 0.4 MG SL tablet Place 1 tablet (0.4 mg total) under the tongue every 5 (five) minutes as needed for chest pain. 07/12/22   Buford Dresser, MD  pantoprazole (PROTONIX) 40 MG tablet Take 1 tablet (40 mg total) by mouth daily. 04/07/22   Kuneff, Renee A, DO  polyethylene glycol powder (GLYCOLAX/MIRALAX) 17 GM/SCOOP powder Take by mouth. 10/03/19   [provider]  predniSONE (DELTASONE) 20 MG tablet 60 mg x1d, 40 mg x3d, 20 mg x2d, 10 mg x2d 09/03/22   Kuneff, Renee A, DO  pregabalin (LYRICA) 150 MG capsule TAKE 1 CAPSULE BY MOUTH TWICE  DAILY 09/21/22   Kuneff, Renee A, DO  Semaglutide, 1 MG/DOSE, 4 MG/3ML SOPN Inject 1 mg as directed once a week. 04/07/22   Kuneff, Renee A, DO      Allergies    Celebrex [celecoxib], Iodine, and Regadenoson    Review of Systems   Review of Systems Negative except as per HPI.  Physical Exam Updated Vital Signs BP 126/85 (BP Location: Left Arm)   Pulse 89   Temp 98.6 F (37 C) (Oral)   Resp 20   Ht '5\' 8"'$  (1.727 m)   Wt 120.2 kg   SpO2 94%   BMI 40.29 kg/m  Physical Exam Vitals and nursing note reviewed.  Constitutional:      Appearance: Normal appearance.  HENT:     Head: Normocephalic and atraumatic.     Mouth/Throat:     Mouth: Mucous membranes are moist.  Eyes:     General: No scleral icterus. Cardiovascular:     Rate and Rhythm: Normal rate and regular rhythm.     Pulses: Normal pulses.     Heart sounds: Normal heart  sounds.  Pulmonary:     Effort: Pulmonary effort is normal.     Breath sounds: Normal breath sounds.  Abdominal:     General: Abdomen is flat.     Palpations: Abdomen is soft.     Tenderness: There is no abdominal tenderness.  Musculoskeletal:        General: No deformity.  Skin:    General: Skin is warm.     Findings: No rash.  Neurological:     General: No focal deficit present.     Mental Status: He is alert.  Psychiatric:        Mood and Affect: Mood normal.     ED Results / Procedures / Treatments   Labs (all labs ordered are listed, but only abnormal results are displayed) Labs Reviewed  RESP PANEL BY RT-PCR (RSV, FLU A&B, COVID)  [id number] - Abnormal; Notable for the following components:      Result Value   SARS Coronavirus 2 by RT PCR POSITIVE (*)    All other components within normal limits    EKG None  Radiology No results found.  Procedures Procedures    Medications Ordered in ED Medications - No data to display  ED Course/ Medical Decision Making/ A&P                           Medical Decision Making Risk Prescription drug management.   This patient presents to the ED for sore throat, cough, runny nose, nasal congestion,  body aches, this involves an extensive number of treatment options, and is a complaint that carries with a high risk of complications and morbidity.  The differential diagnosis includes flu, COVID, RSV, strep, pharyngitis, bronchitis, pneumonia, infectious etiology.  This is not an exhaustive list.  Lab tests: I ordered and personally interpreted labs.  The pertinent results include: Viral panel positive for COVID.  Imaging studies:  Problem list/ ED course/ Critical interventions/ Medical management: HPI: See above Vital signs within normal range and stable throughout visit. Laboratory/imaging studies significant for: See above. On physical examination, patient is afebrile and appears in no acute distress. This patient  presents with symptoms suspicious for COVID. Based on history and physical doubt sinusitis. COVID test was positive. Do not suspect underlying cardiopulmonary process. I considered, but think unlikely, pneumonia. Patient is nontoxic appearing and not in need of emergent medical intervention. Patient told to self isolate at home until symptoms subside for 72 hours.  .  Recommended patient to take TheraFlu or Mucinex for symptom relief.  I also sent an Rx of Tessalon Perle for cough. Follow-up with primary care physician for further evaluation and management.  Return to the ER if new or worsening symptoms. I have reviewed the patient home medicines and have made adjustments as needed.  Cardiac monitoring/EKG: The patient was maintained on a cardiac monitor.  I personally reviewed and interpreted the cardiac monitor which showed an underlying rhythm of: sinus rhythm.  Additional history obtained: External records from outside source obtained and reviewed including: Chart review including previous notes, labs, imaging.  Consultations obtained:  Disposition Continued outpatient therapy. Follow-up with PCP recommended for reevaluation of symptoms. Treatment plan discussed with patient.  Pt acknowledged understanding was agreeable to the plan. Worrisome signs and symptoms were discussed with patient, and patient acknowledged understanding to return to the ED if they noticed these signs and symptoms. Patient was stable upon discharge.   This chart was dictated using voice recognition software.  Despite best efforts to proofread,  errors can occur which can change the documentation meaning.          Final Clinical Impression(s) / ED Diagnoses Final diagnoses:  COVID    Rx / DC Orders ED Discharge Orders          Ordered    benzonatate (TESSALON) 100 MG capsule  Every 8 hours,   Status:  Discontinued  10/02/22 1028    nirmatrelvir/ritonavir (PAXLOVID) 20 x 150 MG & 10 x '100MG'$  TABS  2  times daily,   Status:  Discontinued        10/02/22 1116    benzonatate (TESSALON) 100 MG capsule  Every 8 hours        10/02/22 1134    nirmatrelvir/ritonavir (PAXLOVID) 20 x 150 MG & 10 x '100MG'$  TABS  2 times daily        10/02/22 1134              Rex Kras, [place] 10/02/22 2135    Regan Lemming, MD 10/03/22 [phone]

## 2022-10-02 NOTE — ED Triage Notes (Signed)
Pt arrives POV from home with his wife with c/o body aches, congestion, runny nose, cough, that started 6 pm last night.  Lives with father in law who has Covid.    Pt ambulatory to triage, NAD.

## 2022-10-04 ENCOUNTER — Ambulatory Visit (INDEPENDENT_AMBULATORY_CARE_PROVIDER_SITE_OTHER): Payer: Medicare HMO | Admitting: Family Medicine

## 2022-10-04 ENCOUNTER — Telehealth: Payer: Self-pay

## 2022-10-04 DIAGNOSIS — K219 Gastro-esophageal reflux disease without esophagitis: Secondary | ICD-10-CM

## 2022-10-04 DIAGNOSIS — Z0000 Encounter for general adult medical examination without abnormal findings: Secondary | ICD-10-CM | POA: Diagnosis not present

## 2022-10-04 DIAGNOSIS — D6869 Other thrombophilia: Secondary | ICD-10-CM

## 2022-10-04 DIAGNOSIS — I700 Atherosclerosis of aorta: Secondary | ICD-10-CM

## 2022-10-04 DIAGNOSIS — I2510 Atherosclerotic heart disease of native coronary artery without angina pectoris: Secondary | ICD-10-CM

## 2022-10-04 DIAGNOSIS — E785 Hyperlipidemia, unspecified: Secondary | ICD-10-CM

## 2022-10-04 DIAGNOSIS — Z125 Encounter for screening for malignant neoplasm of prostate: Secondary | ICD-10-CM

## 2022-10-04 DIAGNOSIS — I10 Essential (primary) hypertension: Secondary | ICD-10-CM

## 2022-10-04 DIAGNOSIS — E1169 Type 2 diabetes mellitus with other specified complication: Secondary | ICD-10-CM | POA: Diagnosis not present

## 2022-10-04 DIAGNOSIS — I252 Old myocardial infarction: Secondary | ICD-10-CM

## 2022-10-04 DIAGNOSIS — Z91199 Patient's noncompliance with other medical treatment and regimen due to unspecified reason: Secondary | ICD-10-CM

## 2022-10-04 DIAGNOSIS — E039 Hypothyroidism, unspecified: Secondary | ICD-10-CM

## 2022-10-04 NOTE — Telephone Encounter (Signed)
Noted  

## 2022-10-04 NOTE — Telephone Encounter (Signed)
Patient rescheduled to 1/23

## 2022-10-04 NOTE — Telephone Encounter (Signed)
Transition Care Management Follow-up Telephone Call Date of discharge and from where: [facility] 10/02/22 How have you been since you were released from the hospital? no Any questions or concerns? No  Items Reviewed: Did the pt receive and understand the discharge instructions provided? Yes  Medications obtained and verified? Yes  Other? No  Any new allergies since your discharge? No  Dietary orders reviewed? No Do you have support at home? Yes   Home Care and Equipment/Supplies: Were home health services ordered? not applicable If so, what is the name of the agency? N/a  Has the agency set up a time to come to the patient's home? not applicable Were any new equipment or medical supplies ordered?  No What is the name of the medical supply agency? N/a Were you able to get the supplies/equipment? not applicable Do you have any questions related to the use of the equipment or supplies? No  Functional Questionnaire: (I = Independent and D = Dependent) ADLs: I  Bathing/Dressing- I  Meal Prep- I  Eating- I  Maintaining continence- I  Transferring/Ambulation- I  Managing Meds- I  Follow up appointments reviewed:  PCP Hospital f/u appt confirmed? No   Specialist Hospital f/u appt confirmed? No   Are transportation arrangements needed? No  If their condition worsens, is the pt aware to call PCP or go to the Emergency Dept.? Yes Was the patient provided with contact information for the PCP's office or ED? Yes Was to pt encouraged to call back with questions or concerns? Yes

## 2022-10-04 NOTE — Progress Notes (Unsigned)
This visit occurred during the SARS-CoV-2 public health emergency.  Safety protocols were in place, including screening questions prior to the visit, additional usage of staff PPE, and extensive cleaning of exam room while observing appropriate contact time as indicated for disinfecting solutions.    Patient ID: Juan Stanton, male  DOB: [birth year], 77 y.o.   MRN: [id number] Patient Care Team    Relationship Specialty Notifications Start End  Ma Hillock, DO PCP - General Family Medicine  03/03/21   Buford Dresser, MD PCP - Cardiology Cardiology  07/12/22   Sharyne Peach, MD Consulting Physician Ophthalmology  03/02/21   Alfonso Patten, MD Referring Physician Cardiology  03/02/21   Ulla Gallo, MD Consulting Physician Dermatology  03/02/21   Criselda Peaches, DPM Consulting Physician Podiatry  03/02/21   Elmarie Mainland, MD Consulting Physician Sleep Medicine  03/02/21     No chief complaint on file.   Subjective: Juan Stanton is a 77 y.o. male present for CPE All past medical history, surgical history, allergies, family history, immunizations, medications and social history were updated in the electronic medical record today. All recent labs, ED visits and hospitalizations within the last year were reviewed.  Health maintenance:  Colonoscopy: last screen 11/2016- 5 yr recall. Referral placed. *** Immunizations:  tdap 09/2012, influenza 2022***, PNA series completed, zostavax series completed. Covid series completed Infectious disease screening: Hep C completed.  PSA:  Lab Results  Component Value Date   PSA 1.51 07/20/2021  , pt was counseled on prostate cancer screenings.  Patient has a Dental home. Hospitalizations/ED visits: reviewed       09/02/2022   11:03 AM 06/09/2022    9:49 AM 07/20/2021    9:34 AM 06/03/2021   10:56 AM 03/03/2021    9:17 AM  Depression screen PHQ 2/9  Decreased Interest '1 1 1 1 1  '$ Down, Depressed, Hopeless '1 1 1 '$ 0 0  PHQ - 2 Score  '2 2 2 1 1  '$ Altered sleeping   1  0  Tired, decreased energy  0 1  0  Change in appetite  0 0  2  Feeling bad or failure about yourself   0 0  0  Trouble concentrating  0 0  0  Moving slowly or fidgety/restless  0 0  0  Suicidal thoughts  0 0  0  PHQ-9 Score   4  3      07/20/2021    9:35 AM 03/03/2021    9:17 AM  GAD 7 : Generalized Anxiety Score  Nervous, Anxious, on Edge 0 0  Control/stop worrying 0 0  Worry too much - different things 0 0  Trouble relaxing 0 0  Restless 0 0  Easily annoyed or irritable 0 0  Afraid - awful might happen 0 0  Total GAD 7 Score 0 0          09/02/2022   11:03 AM 06/09/2022    9:51 AM 10/20/2021    7:00 PM 07/20/2021    9:29 AM 06/03/2021   10:54 AM  Fall Risk   Falls in the past year? 0 0 1 0 0  Number falls in past yr: 0 0 0 0 0  Injury with Fall? 0 0 0 0 0  Risk for fall due to : No Fall Risks No Fall Risks;Impaired vision;Impaired balance/gait  No Fall Risks   Risk for fall due to: Comment  related to neuropathy     Follow up Falls  evaluation completed Falls prevention discussed  Falls evaluation completed Falls evaluation completed;Falls prevention discussed    Immunization History  Administered Date(s) Administered   Fluad Quad(high Dose 65+) 06/12/2021   Influenza Split 08/09/2011, 11/02/2012, 06/28/2013, 07/16/2013   Influenza, High Dose Seasonal PF 06/18/2016, 08/14/2018   Influenza, Seasonal, Injecte, Preservative Fre 09/30/2015, 08/08/2017, 06/13/2019   Influenza-Unspecified 07/13/2022   PFIZER(Purple Top)SARS-COV-2 Vaccination 11/23/2019, 12/14/2019, 07/15/2020   Pfizer Covid-19 Vaccine Bivalent Booster 27yr & up 10/13/2021   Pneumococcal Conjugate-13 09/30/2015   Pneumococcal Polysaccharide-23 07/16/2013   Tdap 10/16/2012   Zoster Recombinat (Shingrix) 06/13/2019, 08/14/2019    Past Medical History:  Diagnosis Date   Acute pain of left shoulder 07/20/2021   improved with PT   Allergy 1990   Arthritis 2010    Bilateral hip pain 07/20/2021   bursitis - PT helped   Colon polyps    Coronary artery disease    Depression 03/29/2020   Formatting of this note might be different from the original. Buproprion XL 150 mg qd   Diabetes mellitus type 2 in obese ([place]    Diverticulitis    sigmoid   Fatty liver    noted on renal ultrasound 12/2021   GERD (gastroesophageal reflux disease)    Glaucoma    Gout    Gross hematuria 12/2021   renal ultrasound normal   Heart disease    Hypercholesteremia    Hypertension    Hypothyroid    Kidney stones 09/20/2016   x2, 1888 and 2070   Myocardial infarction ([facility]    [place]   Neuropathy    Sleep apnea    Dr. RReece Levy  Stroke ([facility] 2002   TIA   TIA (transient ischemic attack)    Torn meniscus 01/29/2006   Left knee.   Type 2 diabetes mellitus with hyperlipidemia (HCC)    Allergies  Allergen Reactions   Celebrex [Celecoxib] Swelling   Iodine Nausea Only   Regadenoson Other (See Comments)    Had chest tightness, lightheadedness. This persisted after receiving aminophylline. Came to hospital, was having MI, stented   Past Surgical History:  Procedure Laterality Date   [facility]  CARPAL TUNNEL RELEASE Bilateral    Left-01/15/2016.  Right-03/11/2016.   COLONOSCOPY W/ POLYPECTOMY  12/20/2016   4 mm sessile tubular adenoma w/multifoci high grade dysplasia '@65'$  cm   CORONARY ANGIOPLASTY WITH STENT PLACEMENT  09/16/2010   [facility]  EYE SURGERY  2017   KNEE ARTHROSCOPY Left 01/29/2006   Torn meniscus   LESION EXCISION Left 04/01/2009   Benign.  [facility]   MOHS SURGERY     Family History  Problem Relation Age of Onset   Depression Mother    Diabetes Mother    Hyperlipidemia Mother    Hypertension Mother    Hypertension Father    Depression Sister    Hyperlipidemia Sister    Hypertension  Sister    Alcohol abuse Brother    Diabetes Brother    Heart attack Brother    Heart disease Brother    Hyperlipidemia Brother    Hypertension Brother    Breast cancer Maternal Grandmother    Kidney disease Maternal Grandfather    Stroke Paternal Grandmother    Stroke Paternal Grandfather    Heart disease Brother    Hyperlipidemia Brother    Hypertension Brother    Social History   Social History Narrative  Marital status/children/pets: Married.   Education/employment: Masters degree.  Retired from [employer].   Safety:      -smoke alarm in the home:Yes     - wears seatbelt: Yes       Allergies as of 10/04/2022       Reactions   Celebrex [celecoxib] Swelling   Iodine Nausea Only   Regadenoson Other (See Comments)   Had chest tightness, lightheadedness. This persisted after receiving aminophylline. Came to hospital, was having MI, stented        Medication List        Accurate as of October 04, 2022  8:06 AM. If you have any questions, ask your nurse or doctor.          acetaminophen 650 MG CR tablet Commonly known as: TYLENOL Take by mouth.   allopurinol 300 MG tablet Commonly known as: ZYLOPRIM TAKE 1 TABLET BY MOUTH DAILY   aspirin 81 MG chewable tablet Chew by mouth daily.   atorvastatin 80 MG tablet Commonly known as: LIPITOR TAKE 1 TABLET BY MOUTH DAILY   benzonatate 100 MG capsule Commonly known as: TESSALON Take 1 capsule (100 mg total) by mouth every 8 (eight) hours.   bimatoprost 0.03 % ophthalmic solution Commonly known as: LUMIGAN 1 drop at bedtime.   brimonidine-timolol 0.2-0.5 % ophthalmic solution Commonly known as: COMBIGAN Place 1 drop into both eyes every 12 (twelve) hours.   candesartan-hydrochlorothiazide 16-12.5 MG tablet Commonly known as: ATACAND HCT Take 1 tablet by mouth daily.   clopidogrel 75 MG tablet Commonly known as: PLAVIX Take 1 tablet (75 mg total) by mouth daily.   Co Q-10 100 MG  Caps   doxycycline 100 MG tablet Commonly known as: VIBRA-TABS Take 1 tablet (100 mg total) by mouth 2 (two) times daily.   DULoxetine 60 MG capsule Commonly known as: CYMBALTA Take 1 capsule (60 mg total) by mouth daily.   fluticasone 50 MCG/ACT nasal spray Commonly known as: FLONASE Place 1 spray into both nostrils daily.   levothyroxine 150 MCG tablet Commonly known as: SYNTHROID Take 1 tablet (150 mcg total) by mouth daily.   multivitamin capsule Take 1 capsule by mouth daily.   nitroGLYCERIN 0.4 MG SL tablet Commonly known as: NITROSTAT Place 1 tablet (0.4 mg total) under the tongue every 5 (five) minutes as needed for chest pain.   pantoprazole 40 MG tablet Commonly known as: PROTONIX Take 1 tablet (40 mg total) by mouth daily.   Paxlovid (300/100) 20 x 150 MG & 10 x '100MG'$  Tbpk Generic drug: nirmatrelvir & ritonavir Take 3 tablets by mouth 2 (two) times daily for 5 days. Take nirmatrelvir (150 mg) two tablets twice daily for 5 days and ritonavir (100 mg) one tablet twice daily for 5 days.   polyethylene glycol powder 17 GM/SCOOP powder Commonly known as: GLYCOLAX/MIRALAX Take by mouth.   predniSONE 20 MG tablet Commonly known as: DELTASONE 60 mg x1d, 40 mg x3d, 20 mg x2d, 10 mg x2d   pregabalin 150 MG capsule Commonly known as: LYRICA TAKE 1 CAPSULE BY MOUTH TWICE  DAILY   Semaglutide (1 MG/DOSE) 4 MG/3ML Sopn Inject 1 mg as directed once a week.       All past medical history, surgical history, allergies, family history, immunizations andmedications were updated in the EMR today and reviewed under the history and medication portions of their EMR.       No results found.   ROS: 14 pt review of systems performed and negative (  unless mentioned in an HPI)  Objective: There were no vitals taken for this visit. Physical Exam Constitutional:      General: He is not in acute distress.    Appearance: Normal appearance. He is not ill-appearing,  toxic-appearing or diaphoretic.  HENT:     Head: Normocephalic and atraumatic.     Right Ear: Tympanic membrane, ear canal and external ear normal. There is no impacted cerumen.     Left Ear: Tympanic membrane, ear canal and external ear normal. There is no impacted cerumen.     Nose: Nose normal. No congestion or rhinorrhea.     Mouth/Throat:     Mouth: Mucous membranes are moist.     Pharynx: Oropharynx is clear. No oropharyngeal exudate or posterior oropharyngeal erythema.  Eyes:     General: No scleral icterus.       Right eye: No discharge.        Left eye: No discharge.     Extraocular Movements: Extraocular movements intact.     Pupils: Pupils are equal, round, and reactive to light.  Cardiovascular:     Rate and Rhythm: Normal rate and regular rhythm.     Pulses: Normal pulses.     Heart sounds: Normal heart sounds. No murmur heard.    No friction rub. No gallop.  Pulmonary:     Effort: Pulmonary effort is normal. No respiratory distress.     Breath sounds: Normal breath sounds. No stridor. No wheezing, rhonchi or rales.  Chest:     Chest wall: No tenderness.  Abdominal:     General: Abdomen is flat. Bowel sounds are normal. There is no distension.     Palpations: Abdomen is soft. There is no mass.     Tenderness: There is no abdominal tenderness. There is no right CVA tenderness, left CVA tenderness, guarding or rebound.     Hernia: No hernia is present.  Musculoskeletal:        General: No swelling or tenderness. Normal range of motion.     Cervical back: Normal range of motion and neck supple.     Right lower leg: No edema.     Left lower leg: No edema.  Lymphadenopathy:     Cervical: No cervical adenopathy.  Skin:    General: Skin is warm and dry.     Coloration: Skin is not jaundiced.     Findings: No bruising, lesion or rash.  Neurological:     General: No focal deficit present.     Mental Status: He is alert and oriented to person, place, and time. Mental  status is at baseline.     Cranial Nerves: No cranial nerve deficit.     Sensory: No sensory deficit.     Motor: No weakness.     Coordination: Coordination normal.     Gait: Gait normal.     Deep Tendon Reflexes: Reflexes normal.  Psychiatric:        Mood and Affect: Mood normal.        Behavior: Behavior normal.        Thought Content: Thought content normal.        Judgment: Judgment normal.     No results found.  Assessment/plan: Juan Stanton is a 77 y.o. male present for CPE Acquired thrombophilia ([place]), chronic anticoag - CBC Atherosclerosis of aorta (HCC) - Lipid panel B12 deficiency - B12 Continue supplementation.  He is uncertain of total dose.  No follow-ups on file.  No orders of the defined  types were placed in this encounter.  No orders of the defined types were placed in this encounter.  No orders of the defined types were placed in this encounter.   Referral Orders  No referral(s) requested today      Note is dictated utilizing voice recognition software. Although note has been proof read prior to signing, occasional typographical errors still can be missed. If any questions arise, please do not hesitate to call for verification.  Electronically signed by: Howard Pouch, DO [place]

## 2022-10-11 ENCOUNTER — Telehealth (INDEPENDENT_AMBULATORY_CARE_PROVIDER_SITE_OTHER): Payer: Medicare HMO | Admitting: Family Medicine

## 2022-10-11 VITALS — Wt 265.0 lb

## 2022-10-11 DIAGNOSIS — U099 Post covid-19 condition, unspecified: Secondary | ICD-10-CM

## 2022-10-11 NOTE — Progress Notes (Signed)
VIRTUAL VISIT VIA VIDEO  I connected with Juan Stanton on 10/11/22 at 11:00 AM EST by a video enabled telemedicine application and verified that I am speaking with the correct person using two identifiers. Location patient: Home Location provider: [facility], Office Persons participating in the virtual visit: Patient, Dr. Raoul Pitch and Cyndra Numbers, CMA  I discussed the limitations of evaluation and management by telemedicine and the availability of in person appointments. The patient expressed understanding and agreed to proceed.   Juan Stanton , 1946-03-28, 77 y.o., male MRN: [id number] Patient Care Team    Relationship Specialty Notifications Start End  Ma Hillock, DO PCP - General Family Medicine  03/03/21   Buford Dresser, MD PCP - Cardiology Cardiology  07/12/22   Sharyne Peach, MD Consulting Physician Ophthalmology  03/02/21   Alfonso Patten, MD Referring Physician Cardiology  03/02/21   Ulla Gallo, MD Consulting Physician Dermatology  03/02/21   Criselda Peaches, DPM Consulting Physician Podiatry  03/02/21   Elmarie Mainland, MD Consulting Physician Sleep Medicine  03/02/21     Chief Complaint  Patient presents with   Covid Positive    Tested again last night. No Sx     Subjective: Pt presents for an OV with of COVID-positive test that he took last night.  Patient was diagnosed with COVID on January 6 in the emergency room.  He was provided with Paxlovid and Tessalon Perles during that time.  He states currently he is not having any symptoms and he feels well.  His oxygen saturation is 98%.  He was concerned because he retested his COVID test and it was still positive today.      09/02/2022   11:03 AM 06/09/2022    9:49 AM 07/20/2021    9:34 AM 06/03/2021   10:56 AM 03/03/2021    9:17 AM  Depression screen PHQ 2/9  Decreased Interest '1 1 1 1 1  '$ Down, Depressed, Hopeless '1 1 1 '$ 0 0  PHQ - 2 Score '2 2 2 1 1  '$ Altered sleeping   1  0  Tired,  decreased energy  0 1  0  Change in appetite  0 0  2  Feeling bad or failure about yourself   0 0  0  Trouble concentrating  0 0  0  Moving slowly or fidgety/restless  0 0  0  Suicidal thoughts  0 0  0  PHQ-9 Score   4  3    Allergies  Allergen Reactions   Celebrex [Celecoxib] Swelling   Iodine Nausea Only   Regadenoson Other (See Comments)    Had chest tightness, lightheadedness. This persisted after receiving aminophylline. Came to hospital, was having MI, stented   Social History   Social History Narrative   Marital status/children/pets: Married.   Education/employment: Masters degree.  Retired from [employer].   Safety:      -smoke alarm in the home:Yes     - wears seatbelt: Yes      Past Medical History:  Diagnosis Date   Acute pain of left shoulder 07/20/2021   improved with PT   Allergy 1990   Arthritis 2010   Bilateral hip pain 07/20/2021   bursitis - PT helped   Colon polyps    Coronary artery disease    Depression 03/29/2020   Formatting of this note might be different from the original. Buproprion XL 150 mg qd   Diabetes mellitus type 2 in  obese ([place])    Diverticulitis    sigmoid   Fatty liver    noted on renal ultrasound 12/2021   GERD (gastroesophageal reflux disease)    Glaucoma    Gout    Gross hematuria 12/2021   renal ultrasound normal   Heart disease    Hypercholesteremia    Hypertension    Hypothyroid    Kidney stones 09/20/2016   x2, 1888 and 2070   Myocardial infarction [facility])    Neuromuscular disorder ([place]) 1995   Neuropathy    Sleep apnea    Dr. Reece Levy   Stroke [facility] 2002   TIA   TIA (transient ischemic attack)    Torn meniscus 01/29/2006   Left knee.   Type 2 diabetes mellitus with hyperlipidemia [facility])    Past Surgical History:  Procedure Laterality Date   CARDIAC CATHETERIZATION  02/16/2012   [facility]   CARPAL TUNNEL RELEASE Bilateral    Left-01/15/2016.  Right-03/11/2016.   COLONOSCOPY W/  POLYPECTOMY  12/20/2016   4 mm sessile tubular adenoma w/multifoci high grade dysplasia '@65'$  cm   CORONARY ANGIOPLASTY WITH STENT PLACEMENT  09/16/2010   MI-cardiac catheterization-1 stent-OM.  [facility]   EYE SURGERY  2017   KNEE ARTHROSCOPY Left 01/29/2006   Torn meniscus   LESION EXCISION Left 04/01/2009   Benign.  [facility]   MOHS SURGERY     Family History  Problem Relation Age of Onset   Depression Mother    Diabetes Mother    Hyperlipidemia Mother    Hypertension Mother    Hypertension Father    Depression Sister    Hyperlipidemia Sister    Hypertension Sister    Alcohol abuse Brother    Diabetes Brother    Heart attack Brother    Heart disease Brother    Hyperlipidemia Brother    Hypertension Brother    Breast cancer Maternal Grandmother    Kidney disease Maternal Grandfather    Stroke Paternal Grandmother    Stroke Paternal Grandfather    Heart disease Brother    Hyperlipidemia Brother    Hypertension Brother    Allergies as of 10/11/2022       Reactions   Celebrex [celecoxib] Swelling   Iodine Nausea Only   Regadenoson Other (See Comments)   Had chest tightness, lightheadedness. This persisted after receiving aminophylline. Came to hospital, was having MI, stented        Medication List        Accurate as of October 11, 2022 11:30 AM. If you have any questions, ask your nurse or doctor.          acetaminophen 650 MG CR tablet Commonly known as: TYLENOL Take by mouth.   allopurinol 300 MG tablet Commonly known as: ZYLOPRIM TAKE 1 TABLET BY MOUTH DAILY   aspirin 81 MG chewable tablet Chew by mouth daily.   atorvastatin 80 MG tablet Commonly known as: LIPITOR TAKE 1 TABLET BY MOUTH DAILY   benzonatate 100 MG capsule Commonly known as: TESSALON Take 1 capsule (100 mg total) by mouth every 8 (eight) hours.   bimatoprost 0.03 % ophthalmic solution Commonly known as: LUMIGAN 1 drop at bedtime.    brimonidine-timolol 0.2-0.5 % ophthalmic solution Commonly known as: COMBIGAN Place 1 drop into both eyes every 12 (twelve) hours.   candesartan-hydrochlorothiazide 16-12.5 MG tablet Commonly known as: ATACAND HCT Take 1 tablet by mouth daily.   clopidogrel 75 MG tablet Commonly known as: PLAVIX Take 1  tablet (75 mg total) by mouth daily.   Co Q-10 100 MG Caps   doxycycline 100 MG tablet Commonly known as: VIBRA-TABS Take 1 tablet (100 mg total) by mouth 2 (two) times daily.   DULoxetine 60 MG capsule Commonly known as: CYMBALTA Take 1 capsule (60 mg total) by mouth daily.   fluticasone 50 MCG/ACT nasal spray Commonly known as: FLONASE Place 1 spray into both nostrils daily.   levothyroxine 150 MCG tablet Commonly known as: SYNTHROID Take 1 tablet (150 mcg total) by mouth daily.   multivitamin capsule Take 1 capsule by mouth daily.   nitroGLYCERIN 0.4 MG SL tablet Commonly known as: NITROSTAT Place 1 tablet (0.4 mg total) under the tongue every 5 (five) minutes as needed for chest pain.   pantoprazole 40 MG tablet Commonly known as: PROTONIX Take 1 tablet (40 mg total) by mouth daily.   polyethylene glycol powder 17 GM/SCOOP powder Commonly known as: GLYCOLAX/MIRALAX Take by mouth.   predniSONE 20 MG tablet Commonly known as: DELTASONE 60 mg x1d, 40 mg x3d, 20 mg x2d, 10 mg x2d   pregabalin 150 MG capsule Commonly known as: LYRICA TAKE 1 CAPSULE BY MOUTH TWICE  DAILY   Semaglutide (1 MG/DOSE) 4 MG/3ML Sopn Inject 1 mg as directed once a week.        All past medical history, surgical history, allergies, family history, immunizations andmedications were updated in the EMR today and reviewed under the history and medication portions of their EMR.     ROS Negative, with the exception of above mentioned in HPI   Objective:  Wt 265 lb (120.2 kg)   BMI 40.29 kg/m  Body mass index is 40.29 kg/m. Physical Exam Vitals and nursing note reviewed.   Constitutional:      General: He is not in acute distress.    Appearance: Normal appearance. He is not toxic-appearing.  HENT:     Head: Normocephalic and atraumatic.  Eyes:     General: No scleral icterus.       Right eye: No discharge.        Left eye: No discharge.     Conjunctiva/sclera: Conjunctivae normal.  Pulmonary:     Effort: Pulmonary effort is normal.  Musculoskeletal:     Cervical back: Normal range of motion.  Skin:    Findings: No rash.  Neurological:     Mental Status: He is alert and oriented to person, place, and time. Mental status is at baseline.  Psychiatric:        Mood and Affect: Mood normal.        Behavior: Behavior normal.        Thought Content: Thought content normal.        Judgment: Judgment normal.    No results found. No results found. No results found for this or any previous visit (from the past 24 hour(s)).  Assessment/Plan: Guerin Lashomb is a 77 y.o. male present for OV for  Post covid-19 condition, unspecified Patient has normal oxygen saturations today at 98% and he is asymptomatic. We discussed some patients will carry of a positive COVID test for couple weeks.  This is not concerning, unless he progresses to have more symptoms that would suggest COVID-pneumonia.  Currently he is well and is asymptomatic. Reassured.  Reviewed expectations re: course of current medical issues. Discussed self-management of symptoms. Outlined signs and symptoms indicating need for more acute intervention. Patient verbalized understanding and all questions were answered. Patient received an After-Visit Summary.  No orders of the defined types were placed in this encounter.  No orders of the defined types were placed in this encounter.  Referral Orders  No referral(s) requested today     Note is dictated utilizing voice recognition software. Although note has been proof read prior to signing, occasional typographical errors still can be  missed. If any questions arise, please do not hesitate to call for verification.   electronically signed by:  Howard Pouch, DO  [place]

## 2022-10-14 ENCOUNTER — Ambulatory Visit: Payer: Medicare HMO | Admitting: Podiatry

## 2022-10-15 ENCOUNTER — Other Ambulatory Visit: Payer: Self-pay | Admitting: Family Medicine

## 2022-10-17 ENCOUNTER — Encounter (HOSPITAL_BASED_OUTPATIENT_CLINIC_OR_DEPARTMENT_OTHER): Payer: Self-pay | Admitting: Cardiology

## 2022-10-17 DIAGNOSIS — Z8673 Personal history of transient ischemic attack (TIA), and cerebral infarction without residual deficits: Secondary | ICD-10-CM | POA: Insufficient documentation

## 2022-10-19 ENCOUNTER — Ambulatory Visit (INDEPENDENT_AMBULATORY_CARE_PROVIDER_SITE_OTHER): Payer: Medicare HMO | Admitting: Family Medicine

## 2022-10-19 ENCOUNTER — Encounter: Payer: Self-pay | Admitting: Family Medicine

## 2022-10-19 VITALS — BP 110/68 | HR 51 | Temp 97.5°F | Ht 68.0 in | Wt 270.0 lb

## 2022-10-19 DIAGNOSIS — Z955 Presence of coronary angioplasty implant and graft: Secondary | ICD-10-CM

## 2022-10-19 DIAGNOSIS — I10 Essential (primary) hypertension: Secondary | ICD-10-CM

## 2022-10-19 DIAGNOSIS — E1169 Type 2 diabetes mellitus with other specified complication: Secondary | ICD-10-CM

## 2022-10-19 DIAGNOSIS — Z6839 Body mass index (BMI) 39.0-39.9, adult: Secondary | ICD-10-CM

## 2022-10-19 DIAGNOSIS — K219 Gastro-esophageal reflux disease without esophagitis: Secondary | ICD-10-CM

## 2022-10-19 DIAGNOSIS — Z0000 Encounter for general adult medical examination without abnormal findings: Secondary | ICD-10-CM

## 2022-10-19 DIAGNOSIS — D6869 Other thrombophilia: Secondary | ICD-10-CM

## 2022-10-19 DIAGNOSIS — I700 Atherosclerosis of aorta: Secondary | ICD-10-CM

## 2022-10-19 DIAGNOSIS — Z125 Encounter for screening for malignant neoplasm of prostate: Secondary | ICD-10-CM

## 2022-10-19 DIAGNOSIS — E785 Hyperlipidemia, unspecified: Secondary | ICD-10-CM | POA: Diagnosis not present

## 2022-10-19 DIAGNOSIS — Z79899 Other long term (current) drug therapy: Secondary | ICD-10-CM | POA: Diagnosis not present

## 2022-10-19 DIAGNOSIS — G609 Hereditary and idiopathic neuropathy, unspecified: Secondary | ICD-10-CM

## 2022-10-19 DIAGNOSIS — E039 Hypothyroidism, unspecified: Secondary | ICD-10-CM | POA: Diagnosis not present

## 2022-10-19 DIAGNOSIS — M48061 Spinal stenosis, lumbar region without neurogenic claudication: Secondary | ICD-10-CM

## 2022-10-19 DIAGNOSIS — Z23 Encounter for immunization: Secondary | ICD-10-CM | POA: Diagnosis not present

## 2022-10-19 DIAGNOSIS — I2510 Atherosclerotic heart disease of native coronary artery without angina pectoris: Secondary | ICD-10-CM

## 2022-10-19 DIAGNOSIS — M1A4710 Other secondary chronic gout, right ankle and foot, without tophus (tophi): Secondary | ICD-10-CM

## 2022-10-19 DIAGNOSIS — I252 Old myocardial infarction: Secondary | ICD-10-CM

## 2022-10-19 DIAGNOSIS — M159 Polyosteoarthritis, unspecified: Secondary | ICD-10-CM

## 2022-10-19 LAB — CBC WITH DIFFERENTIAL/PLATELET
Basophils Absolute: 0.1 10*3/uL (ref 0.0–0.1)
Basophils Relative: 0.9 % (ref 0.0–3.0)
Eosinophils Absolute: 0.2 10*3/uL (ref 0.0–0.7)
Eosinophils Relative: 2.4 % (ref 0.0–5.0)
HCT: 43.1 % (ref 39.0–52.0)
Hemoglobin: 14.3 g/dL (ref 13.0–17.0)
Lymphocytes Relative: 25.6 % (ref 12.0–46.0)
Lymphs Abs: 1.8 10*3/uL (ref 0.7–4.0)
MCHC: 33.3 g/dL (ref 30.0–36.0)
MCV: 92.9 fl (ref 78.0–100.0)
Monocytes Absolute: 0.5 10*3/uL (ref 0.1–1.0)
Monocytes Relative: 7.5 % (ref 3.0–12.0)
Neutro Abs: 4.5 10*3/uL (ref 1.4–7.7)
Neutrophils Relative %: 63.6 % (ref 43.0–77.0)
Platelets: 208 10*3/uL (ref 150.0–400.0)
RBC: 4.64 Mil/uL (ref 4.22–5.81)
RDW: 13.9 % (ref 11.5–15.5)
WBC: 7 10*3/uL (ref 4.0–10.5)

## 2022-10-19 LAB — COMPREHENSIVE METABOLIC PANEL
ALT: 35 U/L (ref 0–53)
AST: 24 U/L (ref 0–37)
Albumin: 4 g/dL (ref 3.5–5.2)
Alkaline Phosphatase: 51 U/L (ref 39–117)
BUN: 21 mg/dL (ref 6–23)
CO2: 30 mEq/L (ref 19–32)
Calcium: 9 mg/dL (ref 8.4–10.5)
Chloride: 103 mEq/L (ref 96–112)
Creatinine, Ser: 0.88 mg/dL (ref 0.40–1.50)
GFR: 84.6 mL/min (ref >60.00)
Glucose, Bld: 101 mg/dL — ABNORMAL HIGH (ref 70–99)
Potassium: 4.6 mEq/L (ref 3.5–5.1)
Sodium: 141 mEq/L (ref 135–145)
Total Bilirubin: 0.4 mg/dL (ref 0.2–1.2)
Total Protein: 6.3 g/dL (ref 6.0–8.3)

## 2022-10-19 LAB — LIPID PANEL
Cholesterol: 144 mg/dL (ref 0–200)
HDL: 34.9 mg/dL — ABNORMAL LOW (ref >39.00)
NonHDL: 109.32
Total CHOL/HDL Ratio: 4
Triglycerides: 203 mg/dL — ABNORMAL HIGH (ref 0.0–149.0)
VLDL: 40.6 mg/dL — ABNORMAL HIGH (ref 0.0–40.0)

## 2022-10-19 LAB — MAGNESIUM: Magnesium: 2 mg/dL (ref 1.5–2.5)

## 2022-10-19 LAB — VITAMIN D 25 HYDROXY (VIT D DEFICIENCY, FRACTURES): VITD: 24.77 ng/mL — ABNORMAL LOW (ref 30.00–100.00)

## 2022-10-19 LAB — TSH: TSH: 2.56 u[IU]/mL (ref 0.35–5.50)

## 2022-10-19 LAB — LDL CHOLESTEROL, DIRECT: Direct LDL: 75 mg/dL

## 2022-10-19 LAB — PSA: PSA: 2.14 ng/mL (ref 0.10–4.00)

## 2022-10-19 LAB — HEMOGLOBIN A1C: Hgb A1c MFr Bld: 6.4 % (ref 4.6–6.5)

## 2022-10-19 MED ORDER — DULOXETINE HCL 60 MG PO CPEP: 60.0000 mg | ORAL_CAPSULE | Freq: Every day | ORAL | 1 refills | Status: DC

## 2022-10-19 MED ORDER — CLOPIDOGREL BISULFATE 75 MG PO TABS: 75.0000 mg | ORAL_TABLET | Freq: Every day | ORAL | 3 refills | Status: DC

## 2022-10-19 MED ORDER — TETANUS-DIPHTH-ACELL PERTUSSIS 5-2.5-18.5 LF-MCG/0.5 IM SUSP: 0.5000 mL | Freq: Once | INTRAMUSCULAR | 0 refills | Status: AC

## 2022-10-19 MED ORDER — CANDESARTAN CILEXETIL-HCTZ 16-12.5 MG PO TABS: 1.0000 | ORAL_TABLET | Freq: Every day | ORAL | 1 refills | Status: DC

## 2022-10-19 MED ORDER — PREGABALIN 150 MG PO CAPS: 150.0000 mg | ORAL_CAPSULE | Freq: Two times a day (BID) | ORAL | 5 refills | Status: DC

## 2022-10-19 MED ORDER — SEMAGLUTIDE (2 MG/DOSE) 8 MG/3ML ~~LOC~~ SOPN: 2.0000 mg | PEN_INJECTOR | SUBCUTANEOUS | 5 refills | Status: DC

## 2022-10-19 MED ORDER — PANTOPRAZOLE SODIUM 40 MG PO TBEC: 40.0000 mg | DELAYED_RELEASE_TABLET | Freq: Every day | ORAL | 1 refills | Status: DC

## 2022-10-19 MED ORDER — ALLOPURINOL 300 MG PO TABS: 300.0000 mg | ORAL_TABLET | Freq: Every day | ORAL | 3 refills | Status: DC

## 2022-10-19 MED ORDER — ATORVASTATIN CALCIUM 80 MG PO TABS: 80.0000 mg | ORAL_TABLET | Freq: Every day | ORAL | 3 refills | Status: DC

## 2022-10-19 NOTE — Patient Instructions (Signed)
Return in about 24 weeks (around 04/05/2023) for Routine chronic condition follow-up.        Great to see you today.  I have refilled the medication(s) we provide.   If labs were collected, we will inform you of lab results once received either by echart message or telephone call.   - echart message- for normal results that have been seen by the patient already.   - telephone call: abnormal results or if patient has not viewed results in their echart.

## 2022-10-19 NOTE — Progress Notes (Signed)
Patient ID: Juan Stanton, male  DOB: 1946-08-13, 77 y.o.   MRN: [id number] Patient Care Team    Relationship Specialty Notifications Start End  Ma Hillock, DO PCP - General Family Medicine  03/03/21   Buford Dresser, MD PCP - Cardiology Cardiology  07/12/22   Sharyne Peach, MD Consulting Physician Ophthalmology  03/02/21   Alfonso Patten, MD Referring Physician Cardiology  03/02/21   Ulla Gallo, MD Consulting Physician Dermatology  03/02/21   Criselda Peaches, DPM Consulting Physician Podiatry  03/02/21   Elmarie Mainland, MD Consulting Physician Sleep Medicine  03/02/21     Chief Complaint  Patient presents with   Annual Exam    Pt is fasting     Subjective: Juan Stanton is a 77 y.o.  male present for CPE and Chronic Conditions/illness Management Health maintenance:  Colonoscopy: last screen 08/14/2021- 3 yr recall. Dr. Rocco Pauls Immunizations:  tdap 09/2012-printed, influenza 06/2022, PNA series completed, shingrix completed Infectious disease screening: Hep C completed.  PSA: No results found for: PSA, pt was counseled on prostate cancer screenings.  Assistive device: none Oxygen HUD:[facility] Patient has a Dental home. Hospitalizations/ED visits: reviewed  Gastroesophageal reflux disease, unspecified whether esophagitis present Patient reports condition is stable as long as he remains on Protonix 40 mg daily.  Patient is compliant.   Acquired hypothyroidism Patient reports compliance with 150 mg of levothyroxine daily.  Labs due  Obstructive sleep apnea (adult) (pediatric) Managed by pulmonology.  Spinal stenosis of lumbar region without neurogenic claudication/Peripheral neuropathy, hereditary/idiopathic Patient reports  He would like to take the 150 Lyrica twice daily.  He has continued the B12.  B12 responded well per lab. Pt feels medication has been very helpful.  He has started seeing a neuropathy specialist at the chiropractic  office. Prior note He reports he has neuropathy of both his distal lower extremities and distal upper extremities.  He states he has been worked up by neurology and there is an unknown cause of his neuropathy.  He has been prescribed Cymbalta 90 mg daily and Lyrica 100 mg twice daily.  He states these are both very helpful, however he believes his neuropathy has been worsening over the last few months.  Essential (primary) hypertension/Mixed hyperlipidemia/presence of coronary angioplasty implant and graft/Atherosclerosis of aorta (HCC)/Family history of heart disease/PAD (peripheral artery disease) (HCC)/Morbid obesity (HCC)-BMI 39.0-39.9,adult/Atherosclerosis of native coronary artery of native heart without angina pectoris/Acquired thrombophilia ([place]), chronic anticoag Pt reports compliance with metoprolol XL (12.5 mg) 25 mg daily, aspirin/Plavix/statin, candesartan-HCTZ 16-12.5 mg daily.  Patient denies chest pain, shortness of breath, dizziness or lower extremity edema.  He follows w/ cardiology Pt is  prescribed statin.  Patient is on chronic anticoagulation with aspirin/Plavix  Diabetes/obesity Pt reports compliance with Ozempic 1 mg weekly.    Patient denies dizziness, hyperglycemic or  Patient denies dizziness, hyperglycemic or hypoglycemic events. Patient denies numbness, tingling in the extremities or nonhealing wounds of feet.   BP 110/68   Pulse (!) 51   Temp (!) 97.5 F (36.4 C) (Oral)   Ht '5\' 8"'$  (1.727 m)   Wt 270 lb (122.5 kg)   SpO2 97%   BMI 41.05 kg/m  Physical Exam Constitutional:      General: He is not in acute distress.    Appearance: Normal appearance. He is not ill-appearing, toxic-appearing or diaphoretic.  HENT:     Head: Normocephalic and atraumatic.     Right Ear: Tympanic membrane, ear canal and external  ear normal. There is no impacted cerumen.     Left Ear: Tympanic membrane, ear canal and external ear normal. There is no impacted cerumen.     Nose: Nose  normal. No congestion or rhinorrhea.     Mouth/Throat:     Mouth: Mucous membranes are moist.     Pharynx: Oropharynx is clear. No oropharyngeal exudate or posterior oropharyngeal erythema.  Eyes:     General: No scleral icterus.       Right eye: No discharge.        Left eye: No discharge.     Extraocular Movements: Extraocular movements intact.     Pupils: Pupils are equal, round, and reactive to light.  Cardiovascular:     Rate and Rhythm: Normal rate and regular rhythm.     Pulses: Normal pulses.     Heart sounds: Normal heart sounds. No murmur heard.    No friction rub. No gallop.  Pulmonary:     Effort: Pulmonary effort is normal. No respiratory distress.     Breath sounds: Normal breath sounds. No stridor. No wheezing, rhonchi or rales.  Chest:     Chest wall: No tenderness.  Abdominal:     General: Abdomen is flat. Bowel sounds are normal. There is no distension.     Palpations: Abdomen is soft. There is no mass.     Tenderness: There is no abdominal tenderness. There is no right CVA tenderness, left CVA tenderness, guarding or rebound.     Hernia: No hernia is present.  Musculoskeletal:        General: No swelling or tenderness. Normal range of motion.     Cervical back: Normal range of motion and neck supple.     Right lower leg: No edema.     Left lower leg: No edema.  Lymphadenopathy:     Cervical: No cervical adenopathy.  Skin:    General: Skin is warm and dry.     Coloration: Skin is not jaundiced.     Findings: No bruising, lesion or rash.  Neurological:     General: No focal deficit present.     Mental Status: He is alert and oriented to person, place, and time. Mental status is at baseline.     Cranial Nerves: No cranial nerve deficit.     Sensory: No sensory deficit.     Motor: No weakness.     Coordination: Coordination normal.     Gait: Gait normal.     Deep Tendon Reflexes: Reflexes normal.  Psychiatric:        Mood and Affect: Mood normal.         Behavior: Behavior normal.        Thought Content: Thought content normal.        Judgment: Judgment normal.    Diabetic Foot Exam - Simple   No data filed    Assessment/plan: [name] is a 78 y.o. male present for cpe with Chronic Conditions/illness Management Gastroesophageal reflux disease, unspecified whether esophagitis present/long term PPI use Stable. Continue Protonix 40 mg daily Vit d and mag collected Acquired hypothyroidism Continue  levothyroxine 150 micrograms daily.  If dose needs altered after results will prescribe new dose to pharmacy for him. TSH collected today Obstructive sleep apnea (adult) (pediatric) Managed by pulmonology  Spinal stenosis of lumbar region without neurogenic claudication/Peripheral neuropathy, hereditary/idiopathic Stable Continue Cymbalta 60 mg daily Continue Lyrica to 150 mg twice daily.  [place] controlled substance database reviewed 10/19/22 -Continue B12 daily  Essential (primary) hypertension/Mixed hyperlipidemia/presence of coronary angioplasty implant and graft/Atherosclerosis of aorta (HCC)/Family history of heart disease/PAD (peripheral artery disease) (HCC)/Morbid obesity (HCC)-BMI 39.0-39.9,adult/Atherosclerosis of native coronary artery of native heart without angina pectoris/Acquired thrombophilia ([place]), chronic anticoag Stable Continue  daily baby aspirin. Continue Plavix 75 mg daily Continue  atorvastatin 80 mg daily. Continue  candesartan/HCTZ 16-12.5 mg daily.  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 (Left 1 mg dose active in the event he is unable to get 2 mg d/t stock) Diabetes management is well controlled would like to maximize therapy for cardiovascular protection and weight loss benefits. - Hemoglobin A1c> 5.5 >5.8 >5.7> collected  today Very tight control of his sugars are recommended given his cardiac conditions. Patient is established with podiatry Yearly eye  exams encouraged-completed 06/08/2021 Foot exam up-to-date 04/07/2022 Urine microalbuminUTD 03/2022 Cmp, A1c and lipids collected today  Gout: Stable Continue allopurinol 300 mg daily   Prostate cancer screening - PSA Need for Tdap vaccination - Tdap (BOOSTRIX) 5-2.5-18.5 LF-MCG/0.5 injection; Inject 0.5 mLs into the muscle  Encounter for long-term current use of medication - Vitamin D (25 hydroxy) - Magnesium Routine general medical examination at a health care facility Colonoscopy: last screen 08/14/2021- 3 yr recall. Dr. Rocco Pauls Immunizations:  tdap 09/2012-printed, influenza 06/2022, PNA series completed, shingrix completed Infectious disease screening: Hep C completed.  PSA: No results found for: PSA, pt was counseled on prostate cancer screenings.  Patient was encouraged to exercise greater than 150 minutes a week. Patient was encouraged to choose a diet filled with fresh fruits and vegetables, and lean meats. AVS provided to patient today for education/recommendation on gender specific health and safety maintenance.  Return in about 24 weeks (around 04/05/2023) for Routine chronic condition follow-up.  Orders Placed This Encounter  Procedures   CBC with Differential/Platelet   Comprehensive metabolic panel   Hemoglobin A1c   Lipid panel   PSA   TSH   Vitamin D (25 hydroxy)   Magnesium   Meds ordered this encounter  Medications   allopurinol (ZYLOPRIM) 300 MG tablet    Sig: Take 1 tablet (300 mg total) by mouth daily.    Dispense:  90 tablet    Refill:  3   atorvastatin (LIPITOR) 80 MG tablet    Sig: Take 1 tablet (80 mg total) by mouth daily.    Dispense:  90 tablet    Refill:  3   candesartan-hydrochlorothiazide (ATACAND HCT) 16-12.5 MG tablet    Sig: Take 1 tablet by mouth daily.    Dispense:  90 tablet    Refill:  1   clopidogrel (PLAVIX) 75 MG tablet    Sig: Take 1 tablet (75 mg total) by mouth daily.    Dispense:  90 tablet    Refill:  3    DULoxetine (CYMBALTA) 60 MG capsule    Sig: Take 1 capsule (60 mg total) by mouth daily.    Dispense:  90 capsule    Refill:  1   pantoprazole (PROTONIX) 40 MG tablet    Sig: Take 1 tablet (40 mg total) by mouth daily.    Dispense:  90 tablet    Refill:  1   pregabalin (LYRICA) 150 MG capsule    Sig: Take 1 capsule (150 mg total) by mouth 2 (two) times daily.    Dispense:  60 capsule    Refill:  5   Tdap (BOOSTRIX) 5-2.5-18.5 LF-MCG/0.5 injection    Sig: Inject 0.5 mLs  into the muscle once for 1 dose.    Dispense:  0.5 mL    Refill:  0   Semaglutide, 2 MG/DOSE, 8 MG/3ML SOPN    Sig: Inject 2 mg as directed once a week.    Dispense:  3 mL    Refill:  5    Dc lower dose if 2 mg in stock, if not in stock >then fill 1 mg dose.    Referral Orders  No referral(s) requested today     Note is dictated utilizing voice recognition software. Although note has been proof read prior to signing, occasional typographical errors still can be missed. If any questions arise, please do not hesitate to call for verification.  Electronically signed by: Howard Pouch, DO [place]

## 2022-10-20 ENCOUNTER — Telehealth: Payer: Self-pay | Admitting: Family Medicine

## 2022-10-20 DIAGNOSIS — E559 Vitamin D deficiency, unspecified: Secondary | ICD-10-CM | POA: Insufficient documentation

## 2022-10-20 MED ORDER — LEVOTHYROXINE SODIUM 150 MCG PO TABS: 150.0000 ug | ORAL_TABLET | Freq: Every day | ORAL | 3 refills | Status: DC

## 2022-10-20 NOTE — Telephone Encounter (Signed)
Please call patient -Liver, kidney and thyroid function are normal> I have refilled his thyroid medication at same dose. -Blood cell counts and electrolytes are normal -Diabetes /A1c is increased her fraction to 6.4 from 5.7.  Still well-controlled, and keep in mind this A1c had both the holidays . -PSA is normal -Vitamin D is low at 24.  Ideally for bone health we in for levels around 40, however normal is at least over 30.  I would encourage him to start 1000 units of vitamin D3 daily over-the-counter supplement. -Cholesterol panel l overall looks pretty good with an LDL of 75.  Goal for him is 70.  His triglycerides are above goal at 203.  Goal is less than 150.  Before we consider adding a medication to help bring down his triglycerides, I would encourage him to try to increase the fiber in his diet.  Taking a fiber supplement such as Metamucil twice a day can help bring down triglycerides.  Routine exercise also cuts back on the triglycerides.

## 2022-10-26 ENCOUNTER — Ambulatory Visit (INDEPENDENT_AMBULATORY_CARE_PROVIDER_SITE_OTHER): Payer: Medicare HMO | Admitting: Podiatry

## 2022-10-26 DIAGNOSIS — B351 Tinea unguium: Secondary | ICD-10-CM

## 2022-10-26 DIAGNOSIS — M79674 Pain in right toe(s): Secondary | ICD-10-CM

## 2022-10-26 DIAGNOSIS — M79675 Pain in left toe(s): Secondary | ICD-10-CM | POA: Diagnosis not present

## 2022-10-26 NOTE — Progress Notes (Signed)
  Subjective:  Patient ID: Juan Stanton, male    DOB: [birth year],  MRN: [id number]  Chief Complaint  Patient presents with   Nail Problem    Thick painful toenails, 3 month follow up    77 y.o. male returns with the above complaint. History confirmed with patient.  Still doing well, no new issues.  Blood sugar remains well controlled.  Nails are thick and elongated.  Calluses doing better  Objective:  Physical Exam: warm, good capillary refill, no trophic changes or ulcerative lesions and normal DP and PT pulses.  Absent light touch sensation and protective sensation distally.  He has nail dystrophy and residual onychomycosis of all nails.  Callus asymptomatic today  Assessment:   1. Pain due to onychomycosis of toenails of both feet       Plan:  Patient was evaluated and treated and all questions answered.  Discussed the etiology and treatment options for the condition in detail with the patient. Educated patient on the topical and oral treatment options for mycotic nails. Recommended debridement of the nails today. Sharp and mechanical debridement performed of all painful and mycotic nails today. Nails debrided in length and thickness using a nail nipper. Discussed treatment options including appropriate shoe gear. Follow up as needed for painful nails.      Return in about 3 months (around 01/25/2023) for at risk diabetic foot care.

## 2022-10-28 ENCOUNTER — Telehealth: Payer: Self-pay | Admitting: Neurology

## 2022-10-28 ENCOUNTER — Ambulatory Visit: Payer: Medicare HMO | Admitting: Neurology

## 2022-10-28 ENCOUNTER — Encounter: Payer: Self-pay | Admitting: Neurology

## 2022-10-28 VITALS — BP 146/84 | HR 70 | Ht 69.0 in | Wt 276.0 lb

## 2022-10-28 DIAGNOSIS — H532 Diplopia: Secondary | ICD-10-CM | POA: Diagnosis not present

## 2022-10-28 DIAGNOSIS — G629 Polyneuropathy, unspecified: Secondary | ICD-10-CM | POA: Insufficient documentation

## 2022-10-28 MED ORDER — PYRIDOSTIGMINE BROMIDE 60 MG PO TABS: 60.0000 mg | ORAL_TABLET | Freq: Three times a day (TID) | ORAL | 3 refills | Status: DC | PRN

## 2022-10-28 MED ORDER — ALPRAZOLAM 1 MG PO TABS: ORAL_TABLET | ORAL | 0 refills | Status: DC

## 2022-10-28 NOTE — Progress Notes (Signed)
Chief Complaint  Patient presents with   New Patient (Initial Visit)    Rm 14 alone here for consult on worsening double vision 9 symptoms started about a year ago).       ASSESSMENT AND PLAN  Juan Stanton is a 77 y.o. male   Intermittent binocular diplopia  Differentiation including neuromuscular junctional disorder, also need to rule out cranial neuropathy  MRI of the brain with without contrast  Acetylcholine receptor antibody  Empirically treated with Mestinon 60 mg up to 3 times a day  Long history of peripheral neuropathy  Family history of such  Slow worsening  EMG nerve conduction study  Laboratory evaluation for treatable etiology  DIAGNOSTIC DATA (LABS, IMAGING, TESTING) - I reviewed patient records, labs, notes, testing and imaging myself where available.   MEDICAL HISTORY:  Juan Stanton, is a 77 year old male,  seen in request by ophthalmologist Melissa Noon for evaluation of double vision, his primary care physician is Dr.Kuneff, Reinaldo Raddle, initial evaluation was on October 28, 2022 I reviewed and summarized the referring note. PMHX HLD Obesity Glaucoma, s/p laser HTN CAD, s/p stent Pre-Dm Neuropathy since 2009. Hypothyroid Quit smoke in 1980s, OSA-on CPAP. History of kidney stone Stroke,  He reported a history of right Bell's palsy many years ago, with mild residual right facial weakness, dyssynergic movement of right facial muscles with chewing,  He also had a history of bilateral glaucoma, required laser surgery, since he moved from [place] to [place] in 2022, he has under current ophthalmology care  Since 2023, he noticed intermittent double vision, especially at the evening time, watching TV, the caption can become double, sometimes oblique, but the relative position often changes, he denies difficulty driving, denies difficulty swallowing, or limb muscle weakness  He had long history of bilateral lower extremity paresthesia,  gradually getting worse, 2 brothers and 1 sister have it, his feet can be so bothersome, he has to take Lyrica 150 mg twice a day, Cymbalta 60 mg daily, otherwise he has difficulty sleeping, he denies significant gait abnormality, no upper extremity involvement   PHYSICAL EXAM:   Vitals:   10/28/22 1106  BP: (!) 146/84  Pulse: 70  Weight: 276 lb (125.2 kg)  Height: '5\' 9"'$  (1.753 m)   Body mass index is 40.76 kg/m.  PHYSICAL EXAMNIATION:  Gen: NAD, conversant, well nourised, well groomed                     Cardiovascular: Regular rate rhythm, no peripheral edema, warm, nontender. Eyes: Conjunctivae clear without exudates or hemorrhage Neck: Supple, no carotid bruits. Pulmonary: Clear to auscultation bilaterally   NEUROLOGICAL EXAM:  MENTAL STATUS: Speech/cognition: Awake, alert, oriented to history taking and casual conversation CRANIAL NERVES: CN II: Visual fields are full to confrontation. Pupils are round equal and briskly reactive to light. CN III, IV, VI: Extraocular movements were full, cover and uncover testing, red testing demonstrated bilateral exophoria.  Become more noticeable after muscle fatigue, CN V: Facial sensation is intact to light touch CN VII: Right facial dyssynergic movement, decreased right frontalis muscle movement, no significant eye closure CN VIII: Hearing is normal to causal conversation. CN IX, X: Phonation is normal. CN XI: Head turning and shoulder shrug are intact  MOTOR: There is no pronator drift of out-stretched arms. Muscle bulk and tone are normal. Muscle strength is normal.  REFLEXES: Reflexes are present at and symmetric at the biceps, triceps, absent at knees, and ankles. Plantar responses are  flexor.  SENSORY: Length-dependent decreased light touch, pinprick to knee level  COORDINATION: There is no trunk or limb dysmetria noted.  GAIT/STANCE: He can get up from seated position arm crossed, cautious, mildly unsteady  REVIEW OF  SYSTEMS:  Full 14 system review of systems performed and notable only for as above All other review of systems were negative.   ALLERGIES: Allergies  Allergen Reactions   Celebrex [Celecoxib] Swelling   Iodine Nausea Only   Regadenoson Other (See Comments)    Had chest tightness, lightheadedness. This persisted after receiving aminophylline. Came to hospital, was having MI, stented    HOME MEDICATIONS: Current Outpatient Medications  Medication Sig Dispense Refill   acetaminophen (TYLENOL) 650 MG CR tablet Take by mouth.     allopurinol (ZYLOPRIM) 300 MG tablet Take 1 tablet (300 mg total) by mouth daily. 90 tablet 3   aspirin 81 MG chewable tablet Chew by mouth daily.     atorvastatin (LIPITOR) 80 MG tablet Take 1 tablet (80 mg total) by mouth daily. 90 tablet 3   bimatoprost (LUMIGAN) 0.03 % ophthalmic solution 1 drop at bedtime.     brimonidine-timolol (COMBIGAN) 0.2-0.5 % ophthalmic solution Place 1 drop into both eyes every 12 (twelve) hours.     candesartan-hydrochlorothiazide (ATACAND HCT) 16-12.5 MG tablet Take 1 tablet by mouth daily. 90 tablet 1   clopidogrel (PLAVIX) 75 MG tablet Take 1 tablet (75 mg total) by mouth daily. 90 tablet 3   Coenzyme Q10 (CO Q-10) 100 MG CAPS      DULoxetine (CYMBALTA) 60 MG capsule Take 1 capsule (60 mg total) by mouth daily. 90 capsule 1   fluticasone (FLONASE) 50 MCG/ACT nasal spray Place 1 spray into both nostrils daily. 16 mL 11   levothyroxine (SYNTHROID) 150 MCG tablet Take 1 tablet (150 mcg total) by mouth daily. 90 tablet 3   Multiple Vitamin (MULTIVITAMIN) capsule Take 1 capsule by mouth daily.     Na Sulfate-K Sulfate-Mg Sulf 17.5-3.13-1.6 GM/177ML SOLN MIX AND DRINK AS DIRECTED     nitroGLYCERIN (NITROSTAT) 0.4 MG SL tablet Place 1 tablet (0.4 mg total) under the tongue every 5 (five) minutes as needed for chest pain. 30 tablet 5   pantoprazole (PROTONIX) 40 MG tablet Take 1 tablet (40 mg total) by mouth daily. 90 tablet 1    polyethylene glycol powder (GLYCOLAX/MIRALAX) 17 GM/SCOOP powder Take by mouth.     pregabalin (LYRICA) 150 MG capsule Take 1 capsule (150 mg total) by mouth 2 (two) times daily. 60 capsule 5   Semaglutide, 2 MG/DOSE, 8 MG/3ML SOPN Inject 2 mg as directed once a week. 3 mL 5   No current facility-administered medications for this visit.    PAST MEDICAL HISTORY: Past Medical History:  Diagnosis Date   Acute pain of left shoulder 07/20/2021   improved with PT   Allergy 1990   Arthritis 2010   Bilateral hip pain 07/20/2021   bursitis - PT helped   Colon polyps    Coronary artery disease    COVID-19    Depression 03/29/2020   Formatting of this note might be different from the original. Buproprion XL 150 mg qd   Diabetes mellitus type 2 in obese ([place])    Diverticulitis    sigmoid   Fatty liver    noted on renal ultrasound 12/2021   GERD (gastroesophageal reflux disease)    Glaucoma    Gout    Gross hematuria 12/2021   renal ultrasound normal   Heart disease  Hypercholesteremia    Hypertension    Hypothyroid    Kidney stones 09/20/2016   x2, 1888 and 2070   Myocardial infarction [facility])    Neuromuscular disorder ([place]) 1995   Neuropathy    Sleep apnea    Dr. Reece Levy   Stroke [facility]) 2002   TIA   TIA (transient ischemic attack)    Torn meniscus 01/29/2006   Left knee.   Type 2 diabetes mellitus with hyperlipidemia ([place])     PAST SURGICAL HISTORY: Past Surgical History:  Procedure Laterality Date   CARDIAC CATHETERIZATION  02/16/2012   [facility]   CARPAL TUNNEL RELEASE Bilateral    Left-01/15/2016.  Right-03/11/2016.   COLONOSCOPY W/ POLYPECTOMY  12/20/2016   4 mm sessile tubular adenoma w/multifoci high grade dysplasia '@65'$  cm   CORONARY ANGIOPLASTY WITH STENT PLACEMENT  09/16/2010   MI-cardiac catheterization-1 stent-OM.  [facility]   EYE SURGERY  2017   KNEE ARTHROSCOPY Left 01/29/2006   Torn meniscus   LESION EXCISION Left 04/01/2009    Benign.  [facility]   MOHS SURGERY      FAMILY HISTORY: Family History  Problem Relation Age of Onset   Depression Mother    Diabetes Mother    Hyperlipidemia Mother    Hypertension Mother    Hypertension Father    Depression Sister    Hyperlipidemia Sister    Hypertension Sister    Alcohol abuse Brother    Diabetes Brother    Heart attack Brother    Heart disease Brother    Hyperlipidemia Brother    Hypertension Brother    Breast cancer Maternal Grandmother    Kidney disease Maternal Grandfather    Stroke Paternal Grandmother    Stroke Paternal Grandfather    Heart disease Brother    Hyperlipidemia Brother    Hypertension Brother     SOCIAL HISTORY: Social History   Socioeconomic History   Marital status: Married    Spouse name: Not on file   Number of children: Not on file   Years of education: Not on file   Highest education level: Master's degree (e.g., MA, MS, MEng, MEd, MSW, MBA)  Occupational History   Not on file  Tobacco Use   Smoking status: Former    Packs/day: 3.00    Years: 5.00    Total pack years: 15.00    Types: Cigarettes    Quit date: 02/11/1980    Years since quitting: 42.7   Smokeless tobacco: Never  Vaping Use   Vaping Use: Never used  Substance and Sexual Activity   Alcohol use: Yes    Alcohol/week: 2.0 standard drinks of alcohol    Types: 2 Cans of beer per week   Drug use: No   Sexual activity: Not Currently  Other Topics Concern   Not on file  Social History Narrative   Marital status/children/pets: Married.   Education/employment: Masters degree.  Retired from [employer]   Safety:      -smoke alarm in the home:Yes     - wears seatbelt: Yes      Social Determinants of Health   Financial Resource Strain: Low Risk  (06/09/2022)   Overall Financial Resource Strain (CARDIA)    Difficulty of Paying Living Expenses: Not hard at all  Food Insecurity: No Food Insecurity (06/09/2022)    Hunger Vital Sign    Worried About Running Out of Food in the Last Year: Never true    Ran Out of  Food in the Last Year: Never true  Transportation Needs: No Transportation Needs (06/09/2022)   PRAPARE - Hydrologist (Medical): No    Lack of Transportation (Non-Medical): No  Physical Activity: Inactive (06/09/2022)   Exercise Vital Sign    Days of Exercise per Week: 0 days    Minutes of Exercise per Session: 0 min  Stress: Stress Concern Present (06/09/2022)   [place]    Feeling of Stress : To some extent  Social Connections: Moderately Integrated (06/09/2022)   Social Connection and Isolation Panel [NHANES]    Frequency of Communication with Friends and Family: Once a week    Frequency of Social Gatherings with Friends and Family: More than three times a week    Attends Religious Services: Never    Marine scientist or Organizations: Yes    Attends Archivist Meetings: 1 to 4 times per year    Marital Status: Married  Human resources officer Violence: Unknown (06/09/2022)   Humiliation, Afraid, Rape, and Kick questionnaire    Fear of Current or Ex-Partner: No    Emotionally Abused: No    Physically Abused: No    Sexually Abused: Not on file      Marcial Pacas, M.D. Ph.D.  [facility] [address] [place], [place] [address] Ph: [phone] Fax: [phone]  CC:  Melissa Noon, OD [address],  [place] [address]  Howard Pouch A, DO

## 2022-10-28 NOTE — Telephone Encounter (Signed)
[employer] medicare sent to [facility] they obtain auth [id number] 

## 2022-11-10 ENCOUNTER — Ambulatory Visit
Admission: RE | Admit: 2022-11-10 | Discharge: 2022-11-10 | Disposition: A | Discharge status: Home / Self Care | Payer: Medicare HMO | Source: Ambulatory Visit | Attending: Neurology | Admitting: Neurology

## 2022-11-10 DIAGNOSIS — H532 Diplopia: Secondary | ICD-10-CM | POA: Diagnosis not present

## 2022-11-10 LAB — MULTIPLE MYELOMA PANEL, SERUM
Albumin SerPl Elph-Mcnc: 3.6 g/dL (ref 2.9–4.4)
Albumin/Glob SerPl: 1.4 (ref 0.7–1.7)
Alpha 1: 0.2 g/dL (ref 0.0–0.4)
Alpha2 Glob SerPl Elph-Mcnc: 1 g/dL (ref 0.4–1.0)
B-Globulin SerPl Elph-Mcnc: 0.8 g/dL (ref 0.7–1.3)
Gamma Glob SerPl Elph-Mcnc: 0.6 g/dL (ref 0.4–1.8)
Globulin, Total: 2.6 g/dL (ref 2.2–3.9)
IgA/Immunoglobulin A, Serum: 86 mg/dL (ref 61–437)
IgG (Immunoglobin G), Serum: 680 mg/dL (ref 603–1613)
IgM (Immunoglobulin M), Srm: 39 mg/dL (ref 15–143)
Total Protein: 6.2 g/dL (ref 6.0–8.5)

## 2022-11-10 LAB — MUSK ANTIBODIES: MuSK Antibodies: <1 U/mL

## 2022-11-10 LAB — C-REACTIVE PROTEIN: CRP: 1 mg/L (ref 0–10)

## 2022-11-10 LAB — ANA W/REFLEX IF POSITIVE: Anti Nuclear Antibody (ANA): NEGATIVE

## 2022-11-10 LAB — ACHR ALL WITH REFLEX TO MUSK
AChR Binding Ab, Serum: <0.03 nmol/L (ref 0.00–0.24)
AChR-modulating Ab: 0 % (ref 0–45)
Acetylchol Block Ab: 13 % (ref 0–25)

## 2022-11-10 LAB — CK: Total CK: 187 U/L (ref 41–331)

## 2022-11-10 LAB — SEDIMENTATION RATE: Sed Rate: 6 mm/hr (ref 0–30)

## 2022-11-10 LAB — VITAMIN B12: Vitamin B-12: 1364 pg/mL — ABNORMAL HIGH (ref 232–1245)

## 2022-11-10 MED ORDER — GADOPICLENOL 0.5 MMOL/ML IV SOLN
10.0000 mL | Freq: Once | INTRAVENOUS | Status: AC | PRN
  Administered 2022-11-10: 10 mL via INTRAVENOUS

## 2023-01-25 ENCOUNTER — Ambulatory Visit (INDEPENDENT_AMBULATORY_CARE_PROVIDER_SITE_OTHER): Payer: Medicare HMO | Admitting: Podiatry

## 2023-01-25 DIAGNOSIS — E1169 Type 2 diabetes mellitus with other specified complication: Secondary | ICD-10-CM | POA: Diagnosis not present

## 2023-01-25 DIAGNOSIS — E785 Hyperlipidemia, unspecified: Secondary | ICD-10-CM

## 2023-01-25 DIAGNOSIS — M79675 Pain in left toe(s): Secondary | ICD-10-CM | POA: Diagnosis not present

## 2023-01-25 DIAGNOSIS — L84 Corns and callosities: Secondary | ICD-10-CM | POA: Diagnosis not present

## 2023-01-25 DIAGNOSIS — B351 Tinea unguium: Secondary | ICD-10-CM

## 2023-01-25 DIAGNOSIS — M79674 Pain in right toe(s): Secondary | ICD-10-CM

## 2023-01-25 NOTE — Progress Notes (Signed)
  Subjective:  Patient ID: Juan Stanton, male    DOB: 1946-01-03,  MRN: [id number]  Chief Complaint  Patient presents with   Diabetes    Last A1C= 6.4 Jan. 2024 Last PCP= Jan. 2024  Diabetes controlled. Reports numbness, tingling, burning in the feet.     77 y.o. male returns with the above complaint. History confirmed with patient.  Still doing well, no new issues.  Blood sugar remains well controlled.  Nails are thick and elongated.  Calluses have built up again  Objective:  Physical Exam: warm, good capillary refill, no trophic changes or ulcerative lesions and normal DP and PT pulses.  Absent light touch sensation and protective sensation distally.  He has nail dystrophy and residual onychomycosis of all nails with thickening dystrophy and subungual debris.  Callus present some dorsal 5 bilateral  Assessment:   1. Pain due to onychomycosis of toenails of both feet   2. Callus of foot   3. Type 2 diabetes mellitus with hyperlipidemia (HCC)       Plan:  Patient was evaluated and treated and all questions answered.  Discussed the etiology and treatment options for the condition in detail with the patient.  Prior debridement has been helpful in reducing pain and improving function. Recommended debridement of the nails today. Sharp and mechanical debridement performed of all painful and mycotic nails today. Nails debrided in length and thickness using a nail nipper. Discussed treatment options including appropriate shoe gear. Follow up as needed for painful nails.  All symptomatic hyperkeratoses were safely debrided with a sterile #15 blade to patient's level of comfort without incident. We discussed preventative and palliative care of these lesions including supportive and accommodative shoegear, padding, prefabricated and custom molded accommodative orthoses, use of a pumice stone and lotions/creams daily.     Return in about 3 months (around 04/26/2023) for at risk diabetic foot  care.

## 2023-01-26 ENCOUNTER — Ambulatory Visit: Payer: Medicare HMO | Admitting: Neurology

## 2023-01-26 ENCOUNTER — Telehealth: Payer: Self-pay | Admitting: Neurology

## 2023-01-26 DIAGNOSIS — H532 Diplopia: Secondary | ICD-10-CM

## 2023-01-26 DIAGNOSIS — G629 Polyneuropathy, unspecified: Secondary | ICD-10-CM

## 2023-01-26 NOTE — Progress Notes (Addendum)
Chief Complaint  Patient presents with   Procedure    Rm EMG/NCV 3.      ASSESSMENT AND PLAN  Juan Stanton is a 77 y.o. male   Intermittent binocular diplopia   Personally reviewed MRI of the brain with without contrast mild small vessel disease atrophy,  Acetylcholine receptor antibody was negative  Mestinon did not help,  A prescription of new Prisma has helped,  Long history of peripheral neuropathy Gait abnormality  Slow worsening, has developed mild gait abnormality  EMG nerve conduction study confirmed moderately severe axonal sensorimotor polyneuropathy, prominent sensory component,  Reported family history of neuropathy, differentiation diagnosis include acquired versus Hereditary Neuropathy  Laboratory evaluation showed no treatable etiology, more extensive laboratory evaluation including anti-MAG antibody,  Discussed with patient, he desire further evaluation, lumbar puncture, if there are evidence of elevated protein, may consider trial of IVIG  DIAGNOSTIC DATA (LABS, IMAGING, TESTING) - I reviewed patient records, labs, notes, testing and imaging myself where available.   MEDICAL HISTORY:  Juan Stanton, is a 77 year old male,  seen in request by ophthalmologist Manning Charity for evaluation of double vision, his primary care physician is Dr.Kuneff, Ezequiel Essex, initial evaluation was on October 28, 2022 I reviewed and summarized the referring note. PMHX HLD Obesity Glaucoma, s/p laser HTN CAD, s/p stent Pre-Dm Neuropathy since 2009. Hypothyroid Quit smoke in 1980s, OSA-on CPAP. History of kidney stone Stroke,  He reported a history of right Bell's palsy many years ago, with mild residual right facial weakness, dyssynergic movement of right facial muscles with chewing,  He also had a history of bilateral glaucoma, required laser surgery, since he moved from [place] to [place] in 2022, he has under current ophthalmology care  Since 2023, he  noticed intermittent double vision, especially at the evening time, watching TV, the caption can become double, sometimes oblique, but the relative position often changes, he denies difficulty driving, denies difficulty swallowing, or limb muscle weakness  He had long history of bilateral lower extremity paresthesia, gradually getting worse, 2 brothers and 1 sister have it, his feet can be so bothersome, he has to take Lyrica 150 mg twice a day, Cymbalta 60 mg daily, otherwise he has difficulty sleeping, he denies significant gait abnormality, no upper extremity involvement  UPDATE Jan 26 2023: He continue complains of progressive worsening bilateral lower extremity numbness, tingling, burning pain, has been ongoing for at least 5 years, also began to have fingertips paresthesia  EMG nerve conduction study today confirmed moderately  severe axonal sensorimotor polyneuropathy  Extensive laboratory evaluations failed to demonstrate etiology, normal or negative MuSK antibody, acetylcholine receptor antibody, protein electrophoresis, B12, ANA, CPK, ESR C-reactive protein, vitamin D, TSH,, A1c,   PHYSICAL EXAM:   Vitals:   01/26/23 0919  BP: 139/88  Pulse: 74  Weight: 276 lb (125.2 kg)  Height: 5\' 9"  (1.753 m)   Body mass index is 40.76 kg/m.  PHYSICAL EXAMNIATION:  Gen: NAD, conversant, well nourised, well groomed                     Cardiovascular: Regular rate rhythm, no peripheral edema, warm, nontender. Eyes: Conjunctivae clear without exudates or hemorrhage Neck: Supple, no carotid bruits. Pulmonary: Clear to auscultation bilaterally   NEUROLOGICAL EXAM:  MENTAL STATUS: Speech/cognition: Awake, alert, oriented to history taking and casual conversation CRANIAL NERVES: CN II: Visual fields are full to confrontation. Pupils are round equal and briskly reactive to light. CN III, IV, VI: Extraocular movements  were full, cover and uncover testing, red testing demonstrated bilateral  exophoria.  Become more noticeable after muscle fatigue, CN V: Facial sensation is intact to light touch CN VII: Right facial dyssynergic movement, decreased right frontalis muscle movement, no significant eye closure CN VIII: Hearing is normal to causal conversation. CN IX, X: Phonation is normal. CN XI: Head turning and shoulder shrug are intact  MOTOR: Mild bilateral hand intrinsic muscle atrophy, mild finger abduction weakness, mild bilateral toe flexion, extension weakness  REFLEXES: Areflexia  SENSORY: Length-dependent decreased vibratory sensation, light touch, pinprick below knee level  COORDINATION: There is no trunk or limb dysmetria noted.  GAIT/STANCE: He can get up from seated position arm crossed, cautious, mildly unsteady, could not stand up on tiptoes, heels, positive Romberg signs, REVIEW OF SYSTEMS:  Full 14 system review of systems performed and notable only for as above All other review of systems were negative.   ALLERGIES: Allergies  Allergen Reactions   Celebrex [Celecoxib] Swelling   Iodine Nausea Only   Regadenoson Other (See Comments)    Had chest tightness, lightheadedness. This persisted after receiving aminophylline. Came to hospital, was having MI, stented    HOME MEDICATIONS: Current Outpatient Medications  Medication Sig Dispense Refill   acetaminophen (TYLENOL) 650 MG CR tablet Take by mouth.     allopurinol (ZYLOPRIM) 300 MG tablet Take 1 tablet (300 mg total) by mouth daily. 90 tablet 3   ALPRAZolam (XANAX) 1 MG tablet Take 1-2 tablets 30 minutes prior to MRI, may repeat once as needed. Must have driver. 3 tablet 0   aspirin 81 MG chewable tablet Chew by mouth daily.     atorvastatin (LIPITOR) 80 MG tablet Take 1 tablet (80 mg total) by mouth daily. 90 tablet 3   bimatoprost (LUMIGAN) 0.03 % ophthalmic solution 1 drop at bedtime.     brimonidine-timolol (COMBIGAN) 0.2-0.5 % ophthalmic solution Place 1 drop into both eyes every 12 (twelve)  hours.     candesartan-hydrochlorothiazide (ATACAND HCT) 16-12.5 MG tablet Take 1 tablet by mouth daily. 90 tablet 1   clopidogrel (PLAVIX) 75 MG tablet Take 1 tablet (75 mg total) by mouth daily. 90 tablet 3   Coenzyme Q10 (CO Q-10) 100 MG CAPS      DULoxetine (CYMBALTA) 60 MG capsule Take 1 capsule (60 mg total) by mouth daily. 90 capsule 1   fluticasone (FLONASE) 50 MCG/ACT nasal spray Place 1 spray into both nostrils daily. 16 mL 11   levothyroxine (SYNTHROID) 150 MCG tablet Take 1 tablet (150 mcg total) by mouth daily. 90 tablet 3   Multiple Vitamin (MULTIVITAMIN) capsule Take 1 capsule by mouth daily.     Na Sulfate-K Sulfate-Mg Sulf 17.5-3.13-1.6 GM/177ML SOLN MIX AND DRINK AS DIRECTED     nitroGLYCERIN (NITROSTAT) 0.4 MG SL tablet Place 1 tablet (0.4 mg total) under the tongue every 5 (five) minutes as needed for chest pain. 30 tablet 5   pantoprazole (PROTONIX) 40 MG tablet Take 1 tablet (40 mg total) by mouth daily. 90 tablet 1   polyethylene glycol powder (GLYCOLAX/MIRALAX) 17 GM/SCOOP powder Take by mouth.     pregabalin (LYRICA) 150 MG capsule Take 1 capsule (150 mg total) by mouth 2 (two) times daily. 60 capsule 5   pyridostigmine (MESTINON) 60 MG tablet Take 1 tablet (60 mg total) by mouth 3 (three) times daily as needed. 90 tablet 3   Semaglutide, 2 MG/DOSE, 8 MG/3ML SOPN Inject 2 mg as directed once a week. 3 mL 5  No current facility-administered medications for this visit.    PAST MEDICAL HISTORY: Past Medical History:  Diagnosis Date   Acute pain of left shoulder 07/20/2021   improved with PT   Allergy 1990   Arthritis 2010   Bilateral hip pain 07/20/2021   bursitis - PT helped   Colon polyps    Coronary artery disease    COVID-19    Depression 03/29/2020   Formatting of this note might be different from the original. Buproprion XL 150 mg qd   Diabetes mellitus type 2 in obese (HCC)    Diverticulitis    sigmoid   Fatty liver    noted on renal ultrasound  12/2021   GERD (gastroesophageal reflux disease)    Glaucoma    Gout    Gross hematuria 12/2021   renal ultrasound normal   Heart disease    Hypercholesteremia    Hypertension    Hypothyroid    Kidney stones 09/20/2016   x2, 1888 and 2070   Myocardial infarction [facility])    Neuromuscular disorder (HCC) 1995   Neuropathy    Sleep apnea    Dr. Betti Cruz   Stroke [facility]) 2002   TIA   TIA (transient ischemic attack)    Torn meniscus 01/29/2006   Left knee.   Type 2 diabetes mellitus with hyperlipidemia (HCC)     PAST SURGICAL HISTORY: Past Surgical History:  Procedure Laterality Date   CARDIAC CATHETERIZATION  02/16/2012   [facility]   CARPAL TUNNEL RELEASE Bilateral    Left-01/15/2016.  Right-03/11/2016.   COLONOSCOPY W/ POLYPECTOMY  12/20/2016   4 mm sessile tubular adenoma w/multifoci high grade dysplasia @65  cm   CORONARY ANGIOPLASTY WITH STENT PLACEMENT  09/16/2010   MI-cardiac catheterization-1 stent-OM.  [facility]   EYE SURGERY  2017   KNEE ARTHROSCOPY Left 01/29/2006   Torn meniscus   LESION EXCISION Left 04/01/2009   Benign.  [facility]   MOHS SURGERY      FAMILY HISTORY: Family History  Problem Relation Age of Onset   Depression Mother    Diabetes Mother    Hyperlipidemia Mother    Hypertension Mother    Hypertension Father    Depression Sister    Hyperlipidemia Sister    Hypertension Sister    Alcohol abuse Brother    Diabetes Brother    Heart attack Brother    Heart disease Brother    Hyperlipidemia Brother    Hypertension Brother    Breast cancer Maternal Grandmother    Kidney disease Maternal Grandfather    Stroke Paternal Grandmother    Stroke Paternal Grandfather    Heart disease Brother    Hyperlipidemia Brother    Hypertension Brother     SOCIAL HISTORY: Social History   Socioeconomic History   Marital status: Married    Spouse name: Not on file   Number of children: Not on file   Years of  education: Not on file   Highest education level: Master's degree (e.g., MA, MS, MEng, MEd, MSW, MBA)  Occupational History   Not on file  Tobacco Use   Smoking status: Former    Packs/day: 3.00    Years: 5.00    Additional pack years: 0.00    Total pack years: 15.00    Types: Cigarettes    Quit date: 02/11/1980    Years since quitting: 42.9   Smokeless tobacco: Never  Vaping Use   Vaping Use: Never used  Substance and Sexual  Activity   Alcohol use: Yes    Alcohol/week: 2.0 standard drinks of alcohol    Types: 2 Cans of beer per week   Drug use: No   Sexual activity: Not Currently  Other Topics Concern   Not on file  Social History Narrative   Marital status/children/pets: Married.   Education/employment: Masters degree.  Retired from [employer].   Safety:      -smoke alarm in the home:Yes     - wears seatbelt: Yes      Social Determinants of Health   Financial Resource Strain: Low Risk  (06/09/2022)   Overall Financial Resource Strain (CARDIA)    Difficulty of Paying Living Expenses: Not hard at all  Food Insecurity: No Food Insecurity (06/09/2022)   Hunger Vital Sign    Worried About Running Out of Food in the Last Year: Never true    Ran Out of Food in the Last Year: Never true  Transportation Needs: No Transportation Needs (06/09/2022)   PRAPARE - Administrator, Civil Service (Medical): No    Lack of Transportation (Non-Medical): No  Physical Activity: Inactive (06/09/2022)   Exercise Vital Sign    Days of Exercise per Week: 0 days    Minutes of Exercise per Session: 0 min  Stress: Stress Concern Present (06/09/2022)   [employer] of Occupational Health - Occupational Stress Questionnaire    Feeling of Stress : To some extent  Social Connections: Moderately Integrated (06/09/2022)   Social Connection and Isolation Panel [NHANES]    Frequency of Communication with Friends and Family: Once a week    Frequency of Social Gatherings  with Friends and Family: More than three times a week    Attends Religious Services: Never    Database administrator or Organizations: Yes    Attends Banker Meetings: 1 to 4 times per year    Marital Status: Married  Catering manager Violence: Unknown (06/09/2022)   Humiliation, Afraid, Rape, and Kick questionnaire    Fear of Current or Ex-Partner: No    Emotionally Abused: No    Physically Abused: No    Sexually Abused: Not on file      Levert Feinstein, M.D. Ph.D.  [facility] [address] [place], [place] [address] Ph: [phone] Fax: [phone]  CC:  Natalia Leatherwood, DO [address],  [place] [address]  Claiborne Billings, Renee A, DO

## 2023-01-26 NOTE — Telephone Encounter (Signed)
Please help me get  [facility] neuropath genetic panel for this patient

## 2023-01-26 NOTE — Telephone Encounter (Signed)
Forms completed, will place in MD office for review and signature

## 2023-01-27 NOTE — Telephone Encounter (Signed)
Forms faxed and received confirmation of fax to [facility]

## 2023-02-08 NOTE — Telephone Encounter (Signed)
Resent forms via fax

## 2023-02-16 ENCOUNTER — Telehealth: Payer: Self-pay | Admitting: Neurology

## 2023-02-16 ENCOUNTER — Other Ambulatory Visit: Payer: Self-pay

## 2023-02-16 ENCOUNTER — Ambulatory Visit
Admission: RE | Admit: 2023-02-16 | Discharge: 2023-02-16 | Disposition: A | Discharge status: Home / Self Care | Payer: Medicare HMO | Source: Ambulatory Visit | Attending: Neurology | Admitting: Neurology

## 2023-02-16 DIAGNOSIS — G629 Polyneuropathy, unspecified: Secondary | ICD-10-CM

## 2023-02-16 DIAGNOSIS — H532 Diplopia: Secondary | ICD-10-CM

## 2023-02-16 LAB — CSF CELL COUNT WITH DIFFERENTIAL

## 2023-02-16 NOTE — Telephone Encounter (Signed)
Called patient to let him know labs are needed to start IVIG and he states he will swing by tomorrow to have them drawn

## 2023-02-16 NOTE — Discharge Instructions (Signed)

## 2023-02-16 NOTE — Telephone Encounter (Signed)
Please call patient, spinal fluid testing showed elevated total protein 77, suggestive of a component of inflammatory process for his peripheral neuropathy,  Will start IVIG prior authorization through intra fusion, 2 g/kg divided into 4 days followed by 1 g/kg every 3 weeks divided into 2 days total of 12 treatment

## 2023-02-16 NOTE — Telephone Encounter (Signed)
Call to patient with spinal fluid results per Dr. Terrace Arabia. Patient in agreement to move forward with IVIG and understands we are starting a prior authorization for treatment and will reach out once approved. Appreciative of call.

## 2023-02-17 ENCOUNTER — Other Ambulatory Visit: Payer: Medicare HMO

## 2023-02-17 DIAGNOSIS — Z0289 Encounter for other administrative examinations: Secondary | ICD-10-CM

## 2023-02-17 DIAGNOSIS — G629 Polyneuropathy, unspecified: Secondary | ICD-10-CM

## 2023-02-17 LAB — GRAM STAIN: SPECIMEN QUALITY:: ADEQUATE

## 2023-02-18 LAB — BASIC METABOLIC PANEL
BUN/Creatinine Ratio: 20 (ref 10–24)
BUN: 18 mg/dL (ref 8–27)
CO2: 22 mmol/L (ref 20–29)
Calcium: 9.1 mg/dL (ref 8.6–10.2)
Chloride: 104 mmol/L (ref 96–106)
Creatinine, Ser: 0.88 mg/dL (ref 0.76–1.27)
Glucose: 134 mg/dL — ABNORMAL HIGH (ref 70–99)
Potassium: 4.3 mmol/L (ref 3.5–5.2)
Sodium: 140 mmol/L (ref 134–144)
eGFR: 90 mL/min/{1.73_m2} (ref >59)

## 2023-02-18 LAB — CSF CELL COUNT WITH DIFFERENTIAL
RBC Count, CSF: 6 cells/uL — ABNORMAL HIGH
TOTAL NUCLEATED CELL: 2 cells/uL (ref 0–5)

## 2023-02-18 LAB — GRAM STAIN: MICRO NUMBER:: 14989904

## 2023-02-21 LAB — CSF CELL COUNT WITH DIFFERENTIAL

## 2023-02-21 LAB — FUNGUS CULTURE W SMEAR: MICRO NUMBER:: 14989905

## 2023-02-21 LAB — GRAM STAIN

## 2023-02-21 LAB — GLUCOSE, CSF: Glucose, CSF: 60 mg/dL (ref 40–80)

## 2023-02-21 LAB — PROTEIN, CSF: Total Protein, CSF: 77 mg/dL — ABNORMAL HIGH (ref 15–60)

## 2023-02-21 LAB — VDRL, CSF: VDRL Quant, CSF: NONREACTIVE

## 2023-02-23 NOTE — Progress Notes (Signed)
Please call and inform patient that recent metabolic panel is within normal limits except for a slightly elevated glucose

## 2023-02-24 NOTE — Progress Notes (Signed)
Already resulted by Dr. Terrace Arabia. Plan is to start IVIG.

## 2023-03-15 ENCOUNTER — Encounter: Payer: Self-pay | Admitting: Neurology

## 2023-03-17 ENCOUNTER — Other Ambulatory Visit: Payer: Self-pay | Admitting: Family Medicine

## 2023-03-17 LAB — FUNGUS CULTURE W SMEAR
SMEAR:: NONE SEEN
SPECIMEN QUALITY:: ADEQUATE

## 2023-03-17 LAB — GRAM STAIN

## 2023-04-05 ENCOUNTER — Encounter: Payer: Self-pay | Admitting: Family Medicine

## 2023-04-05 ENCOUNTER — Ambulatory Visit (INDEPENDENT_AMBULATORY_CARE_PROVIDER_SITE_OTHER): Payer: Medicare HMO | Admitting: Family Medicine

## 2023-04-05 VITALS — BP 122/80 | HR 77 | Temp 98.2°F | Wt 269.0 lb

## 2023-04-05 DIAGNOSIS — I10 Essential (primary) hypertension: Secondary | ICD-10-CM

## 2023-04-05 DIAGNOSIS — I739 Peripheral vascular disease, unspecified: Secondary | ICD-10-CM

## 2023-04-05 DIAGNOSIS — I2510 Atherosclerotic heart disease of native coronary artery without angina pectoris: Secondary | ICD-10-CM | POA: Diagnosis not present

## 2023-04-05 DIAGNOSIS — G609 Hereditary and idiopathic neuropathy, unspecified: Secondary | ICD-10-CM

## 2023-04-05 DIAGNOSIS — E785 Hyperlipidemia, unspecified: Secondary | ICD-10-CM

## 2023-04-05 DIAGNOSIS — Z6839 Body mass index (BMI) 39.0-39.9, adult: Secondary | ICD-10-CM

## 2023-04-05 DIAGNOSIS — K219 Gastro-esophageal reflux disease without esophagitis: Secondary | ICD-10-CM | POA: Diagnosis not present

## 2023-04-05 DIAGNOSIS — E1169 Type 2 diabetes mellitus with other specified complication: Secondary | ICD-10-CM

## 2023-04-05 DIAGNOSIS — I252 Old myocardial infarction: Secondary | ICD-10-CM

## 2023-04-05 DIAGNOSIS — E039 Hypothyroidism, unspecified: Secondary | ICD-10-CM

## 2023-04-05 DIAGNOSIS — Z7985 Long-term (current) use of injectable non-insulin antidiabetic drugs: Secondary | ICD-10-CM

## 2023-04-05 DIAGNOSIS — D6869 Other thrombophilia: Secondary | ICD-10-CM

## 2023-04-05 DIAGNOSIS — M1A4710 Other secondary chronic gout, right ankle and foot, without tophus (tophi): Secondary | ICD-10-CM

## 2023-04-05 DIAGNOSIS — I700 Atherosclerosis of aorta: Secondary | ICD-10-CM

## 2023-04-05 LAB — MICROALBUMIN / CREATININE URINE RATIO
Creatinine,U: 162.1 mg/dL
Microalb Creat Ratio: 0.4 mg/g (ref 0.0–30.0)
Microalb, Ur: <0.7 mg/dL (ref 0.0–1.9)

## 2023-04-05 LAB — POCT GLYCOSYLATED HEMOGLOBIN (HGB A1C)
HbA1c POC (<> result, manual entry): 5.5 % (ref 4.0–5.6)
HbA1c, POC (controlled diabetic range): 5.5 % (ref 0.0–7.0)
HbA1c, POC (prediabetic range): 5.5 % — AB (ref 5.7–6.4)
Hemoglobin A1C: 5.5 % (ref 4.0–5.6)

## 2023-04-05 MED ORDER — DULOXETINE HCL 60 MG PO CPEP: 60.0000 mg | ORAL_CAPSULE | Freq: Every day | ORAL | 1 refills | Status: DC

## 2023-04-05 MED ORDER — CANDESARTAN CILEXETIL-HCTZ 16-12.5 MG PO TABS: 1.0000 | ORAL_TABLET | Freq: Every day | ORAL | 1 refills | Status: DC

## 2023-04-05 MED ORDER — PREGABALIN 150 MG PO CAPS: 150.0000 mg | ORAL_CAPSULE | Freq: Two times a day (BID) | ORAL | 1 refills | Status: DC

## 2023-04-05 MED ORDER — SEMAGLUTIDE (2 MG/DOSE) 8 MG/3ML ~~LOC~~ SOPN: 2.0000 mg | PEN_INJECTOR | SUBCUTANEOUS | 5 refills | Status: DC

## 2023-04-05 NOTE — Progress Notes (Signed)
Patient ID: Juan Stanton, male  DOB: 1946/04/21, 77 y.o.   MRN: [id number] Patient Care Team    Relationship Specialty Notifications Start End  Natalia Leatherwood, DO PCP - General Family Medicine  03/03/21   Jodelle Red, MD PCP - Cardiology Cardiology  07/12/22   Merleen Milliner, MD Referring Physician Cardiology  03/02/21   Bettey Costa, MD Consulting Physician Dermatology  03/02/21   Edwin Cap, DPM Consulting Physician Podiatry  03/02/21   Alanda Slim, MD Consulting Physician Sleep Medicine  03/02/21   Manning Charity, OD Referring Physician Optometry  10/27/22     Chief Complaint  Patient presents with   Diabetes    Subjective: Juan Stanton is a 77 y.o.  male present for Chronic Conditions/illness Management Gastroesophageal reflux disease, unspecified whether esophagitis present Patient reports condition is stable as long as he remains on Protonix 40 mg daily.  Patient is compliant.   Acquired hypothyroidism Patient reports compliancewith 150 mg of levothyroxine daily.    Obstructive sleep apnea (adult) (pediatric) Managed by pulmonology.  Spinal stenosis of lumbar region without neurogenic claudication/Peripheral neuropathy, hereditary/idiopathic Patient reports  compliance with 150 Lyrica twice daily.  He has continued the B12.  B12 responded well per lab. Pt feels medication has been very helpful  He has started seeing a neuropathy specialist at the chiropractic office. Prior note He reports he has neuropathy of both his distal lower extremities and distal upper extremities.  He states he has been worked up by neurology and there is an unknown cause of his neuropathy.  He has been prescribed Cymbalta 90 mg daily and Lyrica 100 mg twice daily.  He states these are both very helpful, however he believes his neuropathy has been worsening over the last few months.  Essential (primary) hypertension/Mixed hyperlipidemia/presence of coronary angioplasty  implant and graft/Atherosclerosis of aorta (HCC)/Family history of heart disease/PAD (peripheral artery disease) (HCC)/Morbid obesity (HCC)-BMI 39.0-39.9,adult/Atherosclerosis of native coronary artery of native heart without angina pectoris/Acquired thrombophilia (HCC), chronic anticoag Pt reports compliance with metoprolol XL (12.5 mg) 25 mg daily, aspirin/Plavix/statin, candesartan-HCTZ 16-12.5 mg daily.  Patient denies chest pain, shortness of breath, dizziness or lower extremity edema.  He follows w/ cardiology Pt is  prescribed statin.  Patient is on chronic anticoagulation with aspirin/Plavix  Diabetes/obesity Pt reports compliance with Ozempic 2 mg weekly.     Patient denies dizziness, hyperglycemic or hypoglycemic events. Patient denies worsening numbness, tingling in the extremities or nonhealing wounds of feet. .   BP 122/80   Pulse 77   Temp 98.2 F (36.8 C)   Wt 269 lb (122 kg)   SpO2 97%   BMI 39.72 kg/m  Physical Exam Vitals and nursing note reviewed.  Constitutional:      General: He is not in acute distress.    Appearance: Normal appearance. He is not ill-appearing, toxic-appearing or diaphoretic.  HENT:     Head: Normocephalic and atraumatic.  Eyes:     General: No scleral icterus.       Right eye: No discharge.        Left eye: No discharge.     Extraocular Movements: Extraocular movements intact.     Pupils: Pupils are equal, round, and reactive to light.  Cardiovascular:     Rate and Rhythm: Normal rate and regular rhythm.  Pulmonary:     Effort: Pulmonary effort is normal. No respiratory distress.     Breath sounds: Normal breath sounds. No wheezing, rhonchi or  rales.  Musculoskeletal:     Right lower leg: No edema.     Left lower leg: No edema.  Skin:    General: Skin is warm.     Findings: No rash.  Neurological:     Mental Status: He is alert and oriented to person, place, and time. Mental status is at baseline.  Psychiatric:        Mood and  Affect: Mood normal.        Behavior: Behavior normal.        Thought Content: Thought content normal.        Judgment: Judgment normal.    Results for orders placed or performed in visit on 04/05/23 (from the past 48 hour(s))  POCT glycosylated hemoglobin (Hb A1C)     Status: Abnormal   Collection Time: 04/05/23  9:43 AM  Result Value Ref Range   Hemoglobin A1C 5.5 4.0 - 5.6 %   HbA1c POC (<> result, manual entry) 5.5 4.0 - 5.6 %   HbA1c, POC (prediabetic range) 5.5 (A) 5.7 - 6.4 %   HbA1c, POC (controlled diabetic range) 5.5 0.0 - 7.0 %    Assessment/plan: Juan Stanton is a 77 y.o. male present for cpe with Chronic Conditions/illness Management Gastroesophageal reflux disease, unspecified whether esophagitis present/long term PPI use Stable Continue Protonix 40 mg daily  Acquired hypothyroidism Continue   levothyroxine 150 micrograms daily.   Labs due next visit Obstructive sleep apnea (adult) (pediatric) Managed by pulmonology  Spinal stenosis of lumbar region without neurogenic claudication/Peripheral neuropathy, hereditary/idiopathic Stable Continue  Cymbalta 60 mg daily Continue  Lyrica to 150 mg twice daily.  [place] controlled substance database reviewed 04/05/23 -Continue B12 daily  Essential (primary) hypertension/Mixed hyperlipidemia/presence of coronary angioplasty implant and graft/Atherosclerosis of aorta (HCC)/Family history of heart disease/PAD (peripheral artery disease) (HCC)/Morbid obesity (HCC)-BMI 39.0-39.9,adult/Atherosclerosis of native coronary artery of native heart without angina pectoris/Acquired thrombophilia (HCC), chronic anticoag stable Continue  daily baby aspirin. Continue  Plavix 75 mg daily Continue  atorvastatin 80 mg daily. Continue  candesartan/HCTZ 16-12.5 mg daily.  Obesity/diabetes type II in obese Stable Continue Ozempic 2 mg weekly. (Left 1 mg dose active in the event he is unable to get 2 mg d/t stock) Diabetes management  is well controlled would like to maximize therapy for cardiovascular protection and weight loss benefits. - Hemoglobin A1c> 5.5 >5.8 >5.7> 6.4>5.5 collected  today Very tight control of his sugars are recommended given his cardiac conditions. Patient is established with podiatry Yearly eye exams encouraged-completed 05/2022 Foot exam up-to-date 04/05/2023 Urine microalbumin collected 03/2023  Gout: Stable Continue  allopurinol 300 mg daily   Return in about 7 months (around 10/24/2023) for cpe (20 min), Routine chronic condition follow-up.  Orders Placed This Encounter  Procedures   Microalbumin / creatinine urine ratio   POCT glycosylated hemoglobin (Hb A1C)   Meds ordered this encounter  Medications   candesartan-hydrochlorothiazide (ATACAND HCT) 16-12.5 MG tablet    Sig: Take 1 tablet by mouth daily.    Dispense:  90 tablet    Refill:  1   DULoxetine (CYMBALTA) 60 MG capsule    Sig: Take 1 capsule (60 mg total) by mouth daily.    Dispense:  90 capsule    Refill:  1   pregabalin (LYRICA) 150 MG capsule    Sig: Take 1 capsule (150 mg total) by mouth 2 (two) times daily.    Dispense:  180 capsule    Refill:  1   Semaglutide, 2  MG/DOSE, 8 MG/3ML SOPN    Sig: Inject 2 mg as directed once a week.    Dispense:  3 mL    Refill:  5    Referral Orders  No referral(s) requested today     Note is dictated utilizing voice recognition software. Although note has been proof read prior to signing, occasional typographical errors still can be missed. If any questions arise, please do not hesitate to call for verification.  Electronically signed by: Felix Pacini, DO [facility]

## 2023-04-05 NOTE — Patient Instructions (Addendum)
Return in about 7 months (around 10/24/2023) for cpe (20 min), Routine chronic condition follow-up.        Great to see you today.  I have refilled the medication(s) we provide.   If labs were collected, we will inform you of lab results once received either by echart message or telephone call.   - echart message- for normal results that have been seen by the patient already.   - telephone call: abnormal results or if patient has not viewed results in their echart.

## 2023-04-06 NOTE — Progress Notes (Signed)
Results given.

## 2023-04-26 ENCOUNTER — Encounter: Payer: Self-pay | Admitting: Podiatry

## 2023-04-26 ENCOUNTER — Ambulatory Visit (INDEPENDENT_AMBULATORY_CARE_PROVIDER_SITE_OTHER): Payer: Medicare HMO | Admitting: Podiatry

## 2023-04-26 DIAGNOSIS — M79675 Pain in left toe(s): Secondary | ICD-10-CM | POA: Diagnosis not present

## 2023-04-26 DIAGNOSIS — B351 Tinea unguium: Secondary | ICD-10-CM

## 2023-04-26 DIAGNOSIS — M79674 Pain in right toe(s): Secondary | ICD-10-CM | POA: Diagnosis not present

## 2023-04-26 NOTE — Progress Notes (Signed)
  Subjective:  Patient ID: Juan Stanton, male    DOB: 1945/12/14,  MRN: [id number]  Chief Complaint  Patient presents with   Diabetes    "It's a regular check up and nail trim."    77 y.o. male returns with the above complaint. History confirmed with patient.  Still doing well, no new issues.   Nails are thick and elongated.     Objective:  Physical Exam: warm, good capillary refill, no trophic changes or ulcerative lesions and normal DP and PT pulses.  Absent light touch sensation and protective sensation distally.  He has nail dystrophy and residual onychomycosis of all nails with thickening dystrophy and subungual debris.    Assessment:   1. Pain due to onychomycosis of toenails of both feet       Plan:  Patient was evaluated and treated and all questions answered.  Discussed the etiology and treatment options for the condition in detail with the patient.  Prior debridement has been helpful in reducing pain and improving function. Recommended debridement of the nails today. Sharp and mechanical debridement performed of all painful and mycotic nails today. Nails debrided in length and thickness using a nail nipper. Discussed treatment options including appropriate shoe gear. Follow up as needed for painful nails.     Return in about 3 months (around 07/27/2023) for at risk diabetic foot care.

## 2023-05-12 ENCOUNTER — Encounter (INDEPENDENT_AMBULATORY_CARE_PROVIDER_SITE_OTHER): Payer: Self-pay

## 2023-05-14 ENCOUNTER — Other Ambulatory Visit: Payer: Self-pay | Admitting: Family Medicine

## 2023-06-22 ENCOUNTER — Ambulatory Visit: Payer: Medicare HMO

## 2023-06-22 VITALS — Ht 69.0 in | Wt 269.0 lb

## 2023-06-22 DIAGNOSIS — Z0000 Encounter for general adult medical examination without abnormal findings: Secondary | ICD-10-CM | POA: Diagnosis not present

## 2023-06-22 NOTE — Patient Instructions (Signed)
Juan Stanton , Thank you for taking time to come for your Medicare Wellness Visit. I appreciate your ongoing commitment to your health goals. Please review the following plan we discussed and let me know if I can assist you in the future.   Referrals/Orders/Follow-Ups/Clinician Recommendations: Aim for 30 minutes of exercise or brisk walking, 6-8 glasses of water, and 5 servings of fruits and vegetables each day.  This is a list of the screening recommended for you and due dates:  Health Maintenance  Topic Date Due   Complete foot exam   04/08/2023   Flu Shot  04/28/2023   Eye exam for diabetics  06/12/2023   Hemoglobin A1C  10/06/2023   Yearly kidney function blood test for diabetes  02/17/2024   Yearly kidney health urinalysis for diabetes  04/04/2024   Medicare Annual Wellness Visit  06/21/2024   Colon Cancer Screening  08/14/2024   DTaP/Tdap/Td vaccine (3 - Td or Tdap) 10/19/2032   Pneumonia Vaccine  Completed   Hepatitis C Screening  Completed   Zoster (Shingles) Vaccine  Completed   HPV Vaccine  Aged Out   COVID-19 Vaccine  Discontinued    Advanced directives: (ACP Link)Information on Advanced Care Planning can be found at [facility] [place] Advance Health Care Directives Advance Health Care Directives ([email or web address]   Next Medicare Annual Wellness Visit scheduled for next year: Yes

## 2023-06-22 NOTE — Progress Notes (Signed)
Subjective:   Juan Stanton is a 77 y.o. male who presents for Medicare Annual/Subsequent preventive examination.  Visit Complete: Virtual  I connected with  Juan Stanton on 06/22/23 by a audio enabled telemedicine application and verified that I am speaking with the correct person using two identifiers.  Patient Location: Home  Provider Location: Home Office  I discussed the limitations of evaluation and management by telemedicine. The patient expressed understanding and agreed to proceed.  Because this visit was a virtual/telehealth visit, some criteria may be missing or patient reported. Any vitals not documented were not able to be obtained and vitals that have been documented are patient reported.   Cardiac Risk Factors include: advanced age (>88men, >32 women);diabetes mellitus;dyslipidemia;hypertension;male gender     Objective:    Today's Vitals   06/22/23 0959  Weight: 269 lb (122 kg)  Height: 5\' 9"  (1.753 m)   Body mass index is 39.72 kg/m.     06/22/2023   10:05 AM 10/02/2022    9:32 AM 06/09/2022    9:51 AM 06/03/2021   10:50 AM 09/22/2016    8:13 AM 09/21/2016    8:49 AM  Advanced Directives  Does Patient Have a Medical Advance Directive? No No Yes No Yes Yes  Type of Surveyor, minerals;Living will   Living will  Copy of Healthcare Power of Attorney in Chart?   No - copy requested     Would patient like information on creating a medical advance directive? Yes (MAU/Ambulatory/Procedural Areas - Information given)   No - Patient declined      Current Medications (verified) Outpatient Encounter Medications as of 06/22/2023  Medication Sig   acetaminophen (TYLENOL) 650 MG CR tablet Take by mouth.   allopurinol (ZYLOPRIM) 300 MG tablet Take 1 tablet (300 mg total) by mouth daily.   ALPRAZolam (XANAX) 1 MG tablet Take 1-2 tablets 30 minutes prior to MRI, may repeat once as needed. Must have driver.   aspirin 81 MG chewable tablet  Chew by mouth daily.   atorvastatin (LIPITOR) 80 MG tablet Take 1 tablet (80 mg total) by mouth daily.   bimatoprost (LUMIGAN) 0.03 % ophthalmic solution 1 drop at bedtime.   brimonidine-timolol (COMBIGAN) 0.2-0.5 % ophthalmic solution Place 1 drop into both eyes every 12 (twelve) hours.   candesartan-hydrochlorothiazide (ATACAND HCT) 16-12.5 MG tablet Take 1 tablet by mouth daily.   clopidogrel (PLAVIX) 75 MG tablet Take 1 tablet (75 mg total) by mouth daily.   Coenzyme Q10 (CO Q-10) 100 MG CAPS    DULoxetine (CYMBALTA) 60 MG capsule Take 1 capsule (60 mg total) by mouth daily.   fluticasone (FLONASE) 50 MCG/ACT nasal spray USE 1 SPRAY IN BOTH NOSTRILS  DAILY   Immune Globulin, Human,-ifas (PANZYGA IV) Inject into the vein.   levothyroxine (SYNTHROID) 150 MCG tablet Take 1 tablet (150 mcg total) by mouth daily.   Multiple Vitamin (MULTIVITAMIN) capsule Take 1 capsule by mouth daily.   nitroGLYCERIN (NITROSTAT) 0.4 MG SL tablet Place 1 tablet (0.4 mg total) under the tongue every 5 (five) minutes as needed for chest pain.   pantoprazole (PROTONIX) 40 MG tablet Take 1 tablet (40 mg total) by mouth daily.   polyethylene glycol powder (GLYCOLAX/MIRALAX) 17 GM/SCOOP powder Take by mouth.   pregabalin (LYRICA) 150 MG capsule Take 1 capsule (150 mg total) by mouth 2 (two) times daily.   Semaglutide, 2 MG/DOSE, 8 MG/3ML SOPN Inject 2 mg as directed once a week.   No  facility-administered encounter medications on file as of 06/22/2023.    Allergies (verified) Celebrex [celecoxib], Iodine, and Regadenoson   History: Past Medical History:  Diagnosis Date   Acute pain of left shoulder 07/20/2021   improved with PT   Allergy 1990   Arthritis 2010   Bilateral hip pain 07/20/2021   bursitis - PT helped   Colon polyps    Coronary artery disease    COVID-19    Depression 03/29/2020   Formatting of this note might be different from the original. Buproprion XL 150 mg qd   Diabetes mellitus type 2  in obese    Diverticulitis    sigmoid   Fatty liver    noted on renal ultrasound 12/2021   GERD (gastroesophageal reflux disease)    Glaucoma    Gout    Gross hematuria 12/2021   renal ultrasound normal   Heart disease    Hypercholesteremia    Hypertension    Hypothyroid    Kidney stones 09/20/2016   x2, 1888 and 2070   Myocardial infarction [facility])    Neuromuscular disorder (HCC) 1995   Neuropathy    Sleep apnea    Dr. Betti Cruz   Stroke [facility]) 2002   TIA   TIA (transient ischemic attack)    Torn meniscus 01/29/2006   Left knee.   Type 2 diabetes mellitus with hyperlipidemia [facility])    Past Surgical History:  Procedure Laterality Date   CARDIAC CATHETERIZATION  02/16/2012   [facility]   CARPAL TUNNEL RELEASE Bilateral    Left-01/15/2016.  Right-03/11/2016.   COLONOSCOPY W/ POLYPECTOMY  12/20/2016   4 mm sessile tubular adenoma w/multifoci high grade dysplasia @65  cm   CORONARY ANGIOPLASTY WITH STENT PLACEMENT  09/16/2010   MI-cardiac catheterization-1 stent-OM.  [facility]   EYE SURGERY  2017   KNEE ARTHROSCOPY Left 01/29/2006   Torn meniscus   LESION EXCISION Left 04/01/2009   Benign.  [facility]   MOHS SURGERY     Family History  Problem Relation Age of Onset   Depression Mother    Diabetes Mother    Hyperlipidemia Mother    Hypertension Mother    Hypertension Father    Depression Sister    Hyperlipidemia Sister    Hypertension Sister    Alcohol abuse Brother    Diabetes Brother    Heart attack Brother    Heart disease Brother    Hyperlipidemia Brother    Hypertension Brother    Breast cancer Maternal Grandmother    Kidney disease Maternal Grandfather    Stroke Paternal Grandmother    Stroke Paternal Grandfather    Heart disease Brother    Hyperlipidemia Brother    Hypertension Brother    Social History   Socioeconomic History   Marital status: Married    Spouse name: Not on file   Number of children: Not  on file   Years of education: Not on file   Highest education level: Master's degree (e.g., MA, MS, MEng, MEd, MSW, MBA)  Occupational History   Not on file  Tobacco Use   Smoking status: Former    Current packs/day: 0.00    Average packs/day: 3.0 packs/day for 5.0 years (15.0 ttl pk-yrs)    Types: Cigarettes    Start date: 02/11/1975    Quit date: 02/11/1980    Years since quitting: 43.3   Smokeless tobacco: Never  Vaping Use   Vaping status: Never Used  Substance and Sexual Activity  Alcohol use: Yes    Alcohol/week: 2.0 standard drinks of alcohol    Types: 2 Cans of beer per week    Comment: 1-2 drinks a week   Drug use: No   Sexual activity: Not Currently  Other Topics Concern   Not on file  Social History Narrative   Marital status/children/pets: Married.   Education/employment: Masters degree.  Retired from [employer].   Safety:      -smoke alarm in the home:Yes     - wears seatbelt: Yes      Social Determinants of Health   Financial Resource Strain: Low Risk  (06/22/2023)   Overall Financial Resource Strain (CARDIA)    Difficulty of Paying Living Expenses: Not hard at all  Food Insecurity: No Food Insecurity (06/22/2023)   Hunger Vital Sign    Worried About Running Out of Food in the Last Year: Never true    Ran Out of Food in the Last Year: Never true  Transportation Needs: No Transportation Needs (06/22/2023)   PRAPARE - Administrator, Civil Service (Medical): No    Lack of Transportation (Non-Medical): No  Physical Activity: Insufficiently Active (06/22/2023)   Exercise Vital Sign    Days of Exercise per Week: 3 days    Minutes of Exercise per Session: 30 min  Stress: No Stress Concern Present (06/22/2023)   [employer] of Occupational Health - Occupational Stress Questionnaire    Feeling of Stress : Only a little  Social Connections: Moderately Integrated (06/22/2023)   Social Connection and Isolation Panel [NHANES]     Frequency of Communication with Friends and Family: More than three times a week    Frequency of Social Gatherings with Friends and Family: Three times a week    Attends Religious Services: Never    Active Member of Clubs or Organizations: No    Attends Banker Meetings: 1 to 4 times per year    Marital Status: Married  Recent Concern: Social Connections - Moderately Isolated (04/04/2023)   Social Connection and Isolation Panel [NHANES]    Frequency of Communication with Friends and Family: Once a week    Frequency of Social Gatherings with Friends and Family: More than three times a week    Attends Religious Services: Never    Database administrator or Organizations: No    Attends Engineer, structural: Not on file    Marital Status: Married    Tobacco Counseling Counseling given: Not Answered   Clinical Intake:  Pre-visit preparation completed: Yes  Pain : No/denies pain     Diabetes: Yes CBG done?: No Did pt. bring in CBG monitor from home?: No  How often do you need to have someone help you when you read instructions, pamphlets, or other written materials from your doctor or pharmacy?: 1 - Never  Interpreter Needed?: No  Information entered by :: Kandis Fantasia LPN   Activities of Daily Living    06/22/2023   10:02 AM  In your present state of health, do you have any difficulty performing the following activities:  Hearing? 0  Vision? 0  Difficulty concentrating or making decisions? 0  Walking or climbing stairs? 0  Dressing or bathing? 0  Doing errands, shopping? 0  Preparing Food and eating ? N  Using the Toilet? N  In the past six months, have you accidently leaked urine? N  Do you have problems with loss of bowel control? N  Managing your Medications? N  Managing your Finances? N  Housekeeping or managing your Housekeeping? N    Patient Care Team: Natalia Leatherwood, DO as PCP - General (Family Medicine) Jodelle Red, MD as  PCP - Cardiology (Cardiology) Beverely Pace, Osborn Coho, MD as Referring Physician (Cardiology) Bettey Costa, MD as Consulting Physician (Dermatology) Edwin Cap, DPM as Consulting Physician (Podiatry) Alanda Slim, MD as Consulting Physician (Sleep Medicine) Manning Charity, OD as Referring Physician (Optometry)  Indicate any recent Medical Services you may have received from other than [facility] providers in the past year (date may be approximate).     Assessment:   This is a routine wellness examination for [place].  Hearing/Vision screen Hearing Screening - Comments:: Some hearing loss; followed by audiology  Vision Screening - Comments:: Wears rx glasses - up to date with routine eye exams with Dr. Emily Filbert     Goals Addressed             This Visit's Progress    COMPLETED: Patient Stated       None at this time      Remain active and independent        Depression Screen    06/22/2023   10:04 AM 04/05/2023    9:40 AM 10/19/2022    9:42 AM 09/02/2022   11:03 AM 06/09/2022    9:49 AM 07/20/2021    9:34 AM 06/03/2021   10:56 AM  PHQ 2/9 Scores  PHQ - 2 Score 0 0 2 2 2 2 1   PHQ- 9 Score   4   4     Fall Risk    06/22/2023   10:06 AM 04/05/2023    9:39 AM 04/04/2023   10:22 AM 09/02/2022   11:03 AM 06/09/2022    9:51 AM  Fall Risk   Falls in the past year? 0 0 0 0 0  Number falls in past yr: 0 0  0 0  Injury with Fall? 0 0  0 0  Risk for fall due to : No Fall Risks No Fall Risks  No Fall Risks No Fall Risks;Impaired vision;Impaired balance/gait  Risk for fall due to: Comment     related to neuropathy  Follow up Falls prevention discussed;Education provided;Falls evaluation completed Falls evaluation completed  Falls evaluation completed Falls prevention discussed    MEDICARE RISK AT HOME: Medicare Risk at Home Any stairs in or around the home?: Yes If so, are there any without handrails?: No Home free of loose throw rugs in walkways, pet beds, electrical cords, etc?:  Yes Adequate lighting in your home to reduce risk of falls?: Yes Life alert?: No Use of a cane, walker or w/c?: No Grab bars in the bathroom?: Yes Shower chair or bench in shower?: No Elevated toilet seat or a handicapped toilet?: No  TIMED UP AND GO:  Was the test performed?  No    Cognitive Function:        06/22/2023   10:06 AM 06/09/2022    9:53 AM  6CIT Screen  What Year? 0 points 0 points  What month? 0 points 0 points  What time? 0 points 0 points  Count back from 20 0 points 0 points  Months in reverse 0 points 0 points  Repeat phrase 0 points 0 points  Total Score 0 points 0 points    Immunizations Immunization History  Administered Date(s) Administered   Fluad Quad(high Dose 65+) 06/12/2021   Influenza Split 08/09/2011, 11/02/2012, 06/28/2013, 07/16/2013   Influenza, High Dose  Seasonal PF 06/18/2016, 08/14/2018   Influenza, Seasonal, Injecte, Preservative Fre 09/30/2015, 08/08/2017, 06/13/2019   Influenza-Unspecified 07/13/2022   PFIZER(Purple Top)SARS-COV-2 Vaccination 11/23/2019, 12/14/2019, 07/15/2020   Pfizer Covid-19 Vaccine Bivalent Booster 3yrs & up 10/13/2021   Pneumococcal Conjugate-13 09/30/2015   Pneumococcal Polysaccharide-23 07/16/2013   Tdap 10/16/2012, 10/19/2022   Zoster Recombinant(Shingrix) 06/13/2019, 08/14/2019    TDAP status: Up to date  Flu Vaccine status: Due, Education has been provided regarding the importance of this vaccine. Advised may receive this vaccine at local pharmacy or Health Dept. Aware to provide a copy of the vaccination record if obtained from local pharmacy or Health Dept. Verbalized acceptance and understanding.  Pneumococcal vaccine status: Up to date  Covid-19 vaccine status: Information provided on how to obtain vaccines.   Qualifies for Shingles Vaccine? Yes   Zostavax completed No   Shingrix Completed?: Yes  Screening Tests Health Maintenance  Topic Date Due   FOOT EXAM  04/08/2023   INFLUENZA VACCINE   04/28/2023   OPHTHALMOLOGY EXAM  06/12/2023   HEMOGLOBIN A1C  10/06/2023   Diabetic kidney evaluation - eGFR measurement  02/17/2024   Diabetic kidney evaluation - Urine ACR  04/04/2024   Medicare Annual Wellness (AWV)  06/21/2024   Colonoscopy  08/14/2024   DTaP/Tdap/Td (3 - Td or Tdap) 10/19/2032   Pneumonia Vaccine 69+ Years old  Completed   Hepatitis C Screening  Completed   Zoster Vaccines- Shingrix  Completed   HPV VACCINES  Aged Out   COVID-19 Vaccine  Discontinued    Health Maintenance  Health Maintenance Due  Topic Date Due   FOOT EXAM  04/08/2023   INFLUENZA VACCINE  04/28/2023   OPHTHALMOLOGY EXAM  06/12/2023    Colorectal cancer screening: Type of screening: Colonoscopy. Completed 08/14/21. Repeat every 3 years  Lung Cancer Screening: (Low Dose CT Chest recommended if Age 44-80 years, 20 pack-year currently smoking OR have quit w/in 15years.) does not qualify.   Lung Cancer Screening Referral: n/a  Additional Screening:  Hepatitis C Screening: does qualify; Completed 07/20/21  Vision Screening: Recommended annual ophthalmology exams for early detection of glaucoma and other disorders of the eye. Is the patient up to date with their annual eye exam?  Yes  Who is the provider or what is the name of the office in which the patient attends annual eye exams? Dr. Emily Filbert  If pt is not established with a provider, would they like to be referred to a provider to establish care? No .   Dental Screening: Recommended annual dental exams for proper oral hygiene  Diabetic Foot Exam: Diabetic Foot Exam: Overdue, Pt has been advised about the importance in completing this exam. Pt is scheduled for diabetic foot exam on no concerns at this time .  Community Resource Referral / Chronic Care Management: CRR required this visit?  No   CCM required this visit?  No     Plan:     I have personally reviewed and noted the following in the patient's chart:   Medical and  social history Use of alcohol, tobacco or illicit drugs  Current medications and supplements including opioid prescriptions. Patient is not currently taking opioid prescriptions. Functional ability and status Nutritional status Physical activity Advanced directives List of other physicians Hospitalizations, surgeries, and ER visits in previous 12 months Vitals Screenings to include cognitive, depression, and falls Referrals and appointments  In addition, I have reviewed and discussed with patient certain preventive protocols, quality metrics, and best practice recommendations. A written personalized  care plan for preventive services as well as general preventive health recommendations were provided to patient.     Kandis Fantasia [place], [place]   [id number]/[id number]   After Visit Summary: (MyChart) Due to this being a telephonic visit, the after visit summary with patients personalized plan was offered to patient via MyChart   Nurse Notes: No concerns at this time

## 2023-07-14 ENCOUNTER — Encounter (HOSPITAL_BASED_OUTPATIENT_CLINIC_OR_DEPARTMENT_OTHER): Payer: Self-pay | Admitting: Cardiology

## 2023-07-14 ENCOUNTER — Ambulatory Visit (HOSPITAL_BASED_OUTPATIENT_CLINIC_OR_DEPARTMENT_OTHER): Payer: Medicare HMO | Admitting: Cardiology

## 2023-07-14 VITALS — BP 106/76 | HR 80 | Ht 69.0 in | Wt 274.3 lb

## 2023-07-14 DIAGNOSIS — Z955 Presence of coronary angioplasty implant and graft: Secondary | ICD-10-CM

## 2023-07-14 DIAGNOSIS — I2510 Atherosclerotic heart disease of native coronary artery without angina pectoris: Secondary | ICD-10-CM

## 2023-07-14 DIAGNOSIS — E1159 Type 2 diabetes mellitus with other circulatory complications: Secondary | ICD-10-CM

## 2023-07-14 DIAGNOSIS — I10 Essential (primary) hypertension: Secondary | ICD-10-CM

## 2023-07-14 DIAGNOSIS — G4733 Obstructive sleep apnea (adult) (pediatric): Secondary | ICD-10-CM

## 2023-07-14 DIAGNOSIS — E782 Mixed hyperlipidemia: Secondary | ICD-10-CM | POA: Diagnosis not present

## 2023-07-14 MED ORDER — EZETIMIBE 10 MG PO TABS: 10.0000 mg | ORAL_TABLET | Freq: Every day | ORAL | 3 refills | Status: DC

## 2023-07-14 NOTE — Progress Notes (Signed)
Cardiology Office Note:  .    Date:  07/14/2023  ID:  Juan Stanton, DOB 13-May-1946, MRN [id number] PCP: Natalia Leatherwood, DO  [facility] Providers Cardiologist:  Jodelle Red, MD     History of Present Illness: .    Juan Stanton is a 77 y.o. male with a hx of TIA (2002), myocardial infarction (2011), coronary artery disease s/p PCI x2, hypertension, hyperlipidemia, type 2 diabetes mellitus, GERD, and sleep apnea on CPAP, who is seen for follow-up today. I initially met him 07/12/2022 as a new consult at the request of Kuneff, Renee A, DO for the evaluation and management of atherosclerosis.   Previously followed by Dr. Beverely Pace, last seen 09/2021. Moved here about 2 years prior, last cath was for abnormal stress test in 2019 (stent as above). Did not react well to lexiscan in the past. Has had difficulty getting his heart rate to target on the treadmill.  He states he had a prior heart attack in 2011. He had a stent put in because of a 100% blockage. In 2019 he had another stent put in because of a 70% blockage in his right coronary artery. He reports that back in 2011 around his heart attack was the last time he felt chest tightness.    Based on his stent cards: Stents placed in [place], [place], Dr. Jillyn Hidden, [facility] 2011: stent placed OM, 100% blockage. Xience 2.25 x 18 mm 2019: stent placed RCA, RCA. Emerge 2 x 12 mm   At his initial visit, he reported a strange feeling in his chest while he was sitting and reading. It was a fluttering sensation in his chest that lasted for a few seconds. He endorsed that he feels similar episodes every two years or so. Additionally, he mentioned frequent bilateral LE edema, always worse in his LLE. He confirmed home blood pressures were typically similar to his in-clinic reading 112/74 with taking 1/2 his metoprolol.   Today, he states he has been feeling well. No chest pain; he has never needed to use his  sublingual nitroglycerin. Discussed his cholesterol results from 09/2022 showing an LDL of 75; previously LDL was 67. Currently on max dose of atorvastatin. He also reports that his Ozempic does not seem to be very effective.  After walking up 2 flights of stairs he will feel a little short of breath. This usually lasts for a minute but he doesn't need to stop. No anginal symptoms.  In the office his blood pressure is 106/76. Has easy bleeding with minor skin lacerations but no significant bleeding. If he is bending over then stands up too quickly, may feel a little woozy for a few seconds.   He remains compliant with his CPAP use.  Of note, he has started Octagam infusions.  He denies any palpitations, peripheral edema, headaches, syncope, orthopnea, or PND.  ROS:  Please see the history of present illness. ROS otherwise negative except as noted.  (+) Exertional shortness of breath (+) Positional lightheadedness (+) Easy bleeding  Studies Reviewed: Marland Kitchen    EKG Interpretation Date/Time:  Thursday July 14 2023 10:10:08 EDT Ventricular Rate:  75 PR Interval:  160 QRS Duration:  152 QT Interval:  428 QTC Calculation: 477 R Axis:   12  Text Interpretation: Sinus rhythm with occasional Premature ventricular complexes Right bundle branch block Confirmed by Jodelle Red [phone]) on 07/14/2023 10:13:47 AM    Brain MRI  11/10/2022: IMPRESSION: Unremarkable MRI scan of the brain with and  without contrast showing only mild age-related changes of chronic small vessel disease and cerebral atrophy.   Physical Exam:    VS:  BP 106/76   Pulse 80   Ht 5\' 9"  (1.753 m)   Wt 274 lb 4.8 oz (124.4 kg)   SpO2 95%   BMI 40.51 kg/m    Wt Readings from Last 3 Encounters:  07/14/23 274 lb 4.8 oz (124.4 kg)  06/22/23 269 lb (122 kg)  04/05/23 269 lb (122 kg)    GEN: Well nourished, well developed in no acute distress HEENT: Normal, moist mucous membranes NECK: No JVD CARDIAC: regular  rhythm, normal S1 and S2, no rubs or gallops. No murmur. VASCULAR: Radial and DP pulses 2+ bilaterally. No carotid bruits RESPIRATORY:  Clear to auscultation without rales, wheezing or rhonchi  ABDOMEN: Soft, non-tender, non-distended MUSCULOSKELETAL:  Ambulates independently SKIN: Warm and dry, no edema NEUROLOGIC:  Alert and oriented x 3. No focal neuro deficits noted. PSYCHIATRIC:  Normal affect   ASSESSMENT AND PLAN: .    Coronary artery disease History of MI 2011 ([place]) s/p PCI History of PCI ([place]) 2019 Family history of heart disease History of TIA Mixed hyperlipidemia -active, denies angina -currently on aspirin 81 mg daily, clopidogrel 75 mg daily, atorvastatin 80 mg daily -reviewed recommendations for use of SL nitroglycerin -discussed dropping to single antiplatelet. Suspect guidelines will change to recommending clopidogrel over aspirin. No issues with bleeding, prefers to continue DAPT for now -last LDL 75, but he would benefit from more aggressive goal of <55 given diabetes. On max atorvastatin, will add ezetimibe today and recheck lipids in 3 mos   Type II diabetes -on semaglutide, but not seeing much benefit. Recommended he discuss with his PCP if Greggory Keen would be a good option for him   Hypertension -on candesartan-HCTZ 16-12.5 mg daily   OSA -continue CPAP   Cardiac risk counseling and prevention recommendations: -recommend heart healthy/Mediterranean diet, with whole grains, fruits, vegetable, fish, lean meats, nuts, and olive oil. Limit salt. -recommend moderate walking, 3-5 times/week for 30-50 minutes each session. Aim for at least 150 minutes.week. Goal should be pace of 3 miles/hours, or walking 1.5 miles in 30 minutes -recommend avoidance of tobacco products. Avoid excess alcohol.  Dispo: Follow-up in 1 year, or sooner as needed.  I,Mathew Stumpf,acting as a Neurosurgeon for [employer], MD.,have documented all relevant documentation on the behalf of  Jodelle Red, MD,as directed by  Jodelle Red, MD while in the presence of Jodelle Red, MD.  I, Jodelle Red, MD, have reviewed all documentation for this visit. The documentation on 07/14/23 for the exam, diagnosis, procedures, and orders are all accurate and complete.   Signed, Jodelle Red, MD

## 2023-07-14 NOTE — Patient Instructions (Signed)
Medication Instructions:  START EZETIMIBE 10 MG DAILY   *If you need a refill on your cardiac medications before your next appointment, please call your pharmacy*  Lab Work: FASTING LIPID IN [place]   If you have labs (blood work) drawn today and your tests are completely normal, you will receive your results only by: MyChart Message (if you have MyChart) OR A paper copy in the mail If you have any lab test that is abnormal or we need to change your treatment, we will call you to review the results.  Testing/Procedures: NONE   Follow-Up: At [facility], you and your health needs are our priority.  As part of our continuing mission to provide you with exceptional heart care, we have created designated Provider Care Teams.  These Care Teams include your primary Cardiologist (physician) and Advanced Practice Providers (APPs -  Physician Assistants and Nurse Practitioners) who all work together to provide you with the care you need, when you need it.  We recommend signing up for the patient portal called "MyChart".  Sign up information is provided on this After Visit Summary.  MyChart is used to connect with patients for Virtual Visits (Telemedicine).  Patients are able to view lab/test results, encounter notes, upcoming appointments, etc.  Non-urgent messages can be sent to your provider as well.   To learn more about what you can do with MyChart, go to [email or web address].    Your next appointment:   12 month(s)  Provider:   Jodelle Red, MD or Gillian Shields, NP

## 2023-07-15 LAB — HM DIABETES EYE EXAM

## 2023-07-26 ENCOUNTER — Other Ambulatory Visit: Payer: Self-pay | Admitting: Family Medicine

## 2023-07-28 ENCOUNTER — Ambulatory Visit: Payer: Medicare HMO | Admitting: Podiatry

## 2023-08-01 ENCOUNTER — Encounter: Payer: Self-pay | Admitting: Neurology

## 2023-08-01 ENCOUNTER — Telehealth: Payer: Self-pay | Admitting: Neurology

## 2023-08-01 ENCOUNTER — Ambulatory Visit (INDEPENDENT_AMBULATORY_CARE_PROVIDER_SITE_OTHER): Payer: Medicare HMO | Admitting: Neurology

## 2023-08-01 VITALS — BP 138/69 | HR 90 | Ht 69.0 in | Wt 276.0 lb

## 2023-08-01 DIAGNOSIS — H532 Diplopia: Secondary | ICD-10-CM

## 2023-08-01 DIAGNOSIS — R269 Unspecified abnormalities of gait and mobility: Secondary | ICD-10-CM | POA: Insufficient documentation

## 2023-08-01 DIAGNOSIS — G629 Polyneuropathy, unspecified: Secondary | ICD-10-CM | POA: Insufficient documentation

## 2023-08-01 NOTE — Progress Notes (Signed)
Chief Complaint  Patient presents with   Follow-up    Rm 15. Patient alone. Reports numbness in legs up to knees and in hands, reports it has worsened a little since last visit.       ASSESSMENT AND PLAN  Shuayb Schepers is a 77 y.o. male   Intermittent binocular diplopia   Personally reviewed MRI of the brain with without contrast mild small vessel disease atrophy,  Acetylcholine receptor antibody was negative  Mestinon did not help,  A prescription of new Prisma has helped,  Long history of peripheral neuropathy Gait abnormality  Slow worsening, has developed mild gait abnormality  EMG nerve conduction study confirmed moderately severe axonal sensorimotor polyneuropathy, prominent sensory component,  Reported family history of neuropathy, differentiation diagnosis include acquired versus Hereditary Neuropathy  Laboratory evaluation showed no treatable etiology,    IVIG since June 2024 showed no significant improvement,  Repeat EMG nerve conduction study to see if there are any objective improvement,  Complete evaluation with MRI of cervical and lumbar spine  DIAGNOSTIC DATA (LABS, IMAGING, TESTING) - I reviewed patient records, labs, notes, testing and imaging myself where available.   MEDICAL HISTORY:  Randen Kauth, is a 77 year old male,  seen in request by ophthalmologist Manning Charity for evaluation of double vision, his primary care physician is Dr.Kuneff, Ezequiel Essex, initial evaluation was on October 28, 2022 I reviewed and summarized the referring note. PMHX HLD Obesity Glaucoma, s/p laser HTN CAD, s/p stent Pre-Dm Neuropathy since 2009. Hypothyroid Quit smoke in 1980s, OSA-on CPAP. History of kidney stone Stroke,  He reported a history of right Bell's palsy many years ago, with mild residual right facial weakness, dyssynergic movement of right facial muscles with chewing,  He also had a history of bilateral glaucoma, required laser surgery, since he moved  from [place] to [place] in 2022, he has under current ophthalmology care  Since 2023, he noticed intermittent double vision, especially at the evening time, watching TV, the caption can become double, sometimes oblique, but the relative position often changes, he denies difficulty driving, denies difficulty swallowing, or limb muscle weakness  He had long history of bilateral lower extremity paresthesia, gradually getting worse, 2 brothers and 1 sister have it, his feet can be so bothersome, he has to take Lyrica 150 mg twice a day, Cymbalta 60 mg daily, otherwise he has difficulty sleeping, he denies significant gait abnormality, no upper extremity involvement  UPDATE Jan 26 2023: He continue complains of progressive worsening bilateral lower extremity numbness, tingling, burning pain, has been ongoing for at least 5 years, also began to have fingertips paresthesia  EMG nerve conduction study today confirmed moderately  severe axonal sensorimotor polyneuropathy  Extensive laboratory evaluations failed to demonstrate etiology, normal or negative MuSK antibody, acetylcholine receptor antibody, protein electrophoresis, B12, ANA, CPK, ESR C-reactive protein, vitamin D, TSH,, A1c,  UPDATE Nov 4th 2024,   Lumbar puncture in May 2024 total protein of 77 He began to receive IVIG since June 2024, tolerating infusion well, but denies significant improvement, actually complains worsening balance issues, hands weakness, difficulty opening jars, continue to have limb numbness   PHYSICAL EXAM:   Vitals:   08/01/23 1005  BP: 138/69  Pulse: 90  Weight: 276 lb (125.2 kg)  Height: 5\' 9"  (1.753 m)   Body mass index is 40.76 kg/m.  PHYSICAL EXAMNIATION:  Gen: NAD, conversant, well nourised, well groomed  Cardiovascular: Regular rate rhythm, no peripheral edema, warm, nontender. Eyes: Conjunctivae clear without exudates or hemorrhage Neck: Supple, no carotid  bruits. Pulmonary: Clear to auscultation bilaterally   NEUROLOGICAL EXAM:  MENTAL STATUS: Speech/cognition: Awake, alert, oriented to history taking and casual conversation CRANIAL NERVES: CN II: Visual fields are full to confrontation. Pupils are round equal and briskly reactive to light. CN III, IV, VI: Extraocular movements were full, cover and uncover testing, red testing demonstrated bilateral exophoria.  Become more noticeable after muscle fatigue, CN V: Facial sensation is intact to light touch CN VII: Right facial dyssynergic movement, decreased right frontalis muscle movement, no significant eye closure CN VIII: Hearing is normal to causal conversation. CN IX, X: Phonation is normal. CN XI: Head turning and shoulder shrug are intact  MOTOR: Mild bilateral hand intrinsic muscle atrophy, mild finger abduction weakness, mild bilateral toe flexion, extension weakness  REFLEXES: Areflexia  SENSORY: Length-dependent decreased vibratory sensation, light touch, pinprick below knee level  COORDINATION: There is no trunk or limb dysmetria noted.  GAIT/STANCE: He can get up from seated position arm crossed, cautious, mildly unsteady, could not stand up on tiptoes, heels, positive Romberg signs, REVIEW OF SYSTEMS:  Full 14 system review of systems performed and notable only for as above All other review of systems were negative.   ALLERGIES: Allergies  Allergen Reactions   Celebrex [Celecoxib] Swelling   Iodine Nausea Only   Regadenoson Other (See Comments)    Had chest tightness, lightheadedness. This persisted after receiving aminophylline. Came to hospital, was having MI, stented    HOME MEDICATIONS: Current Outpatient Medications  Medication Sig Dispense Refill   acetaminophen (TYLENOL) 650 MG CR tablet Take by mouth.     allopurinol (ZYLOPRIM) 300 MG tablet Take 1 tablet (300 mg total) by mouth daily. 90 tablet 3   aspirin 81 MG chewable tablet Chew by mouth daily.      atorvastatin (LIPITOR) 80 MG tablet Take 1 tablet (80 mg total) by mouth daily. 90 tablet 3   bimatoprost (LUMIGAN) 0.03 % ophthalmic solution 1 drop at bedtime.     brimonidine-timolol (COMBIGAN) 0.2-0.5 % ophthalmic solution Place 1 drop into both eyes every 12 (twelve) hours.     candesartan-hydrochlorothiazide (ATACAND HCT) 16-12.5 MG tablet Take 1 tablet by mouth daily. 90 tablet 1   clopidogrel (PLAVIX) 75 MG tablet Take 1 tablet (75 mg total) by mouth daily. 90 tablet 3   Coenzyme Q10 (CO Q-10) 100 MG CAPS      DULoxetine (CYMBALTA) 60 MG capsule Take 1 capsule (60 mg total) by mouth daily. 90 capsule 1   ezetimibe (ZETIA) 10 MG tablet Take 1 tablet (10 mg total) by mouth daily. 90 tablet 3   fluticasone (FLONASE) 50 MCG/ACT nasal spray USE 1 SPRAY IN BOTH NOSTRILS  DAILY 16 mL 6   Immune Globulin, Human, (OCTAGAM IV) Inject 1 Dose into the vein every 21 ( twenty-one) days.     Immune Globulin, Human,-ifas (PANZYGA IV) Inject into the vein.     levothyroxine (SYNTHROID) 150 MCG tablet Take 1 tablet (150 mcg total) by mouth daily. 90 tablet 3   Multiple Vitamin (MULTIVITAMIN) capsule Take 1 capsule by mouth daily.     nitroGLYCERIN (NITROSTAT) 0.4 MG SL tablet Place 1 tablet (0.4 mg total) under the tongue every 5 (five) minutes as needed for chest pain. 30 tablet 5   pantoprazole (PROTONIX) 40 MG tablet Take 1 tablet (40 mg total) by mouth daily. 90 tablet 1  polyethylene glycol powder (GLYCOLAX/MIRALAX) 17 GM/SCOOP powder Take by mouth.     pregabalin (LYRICA) 150 MG capsule Take 1 capsule (150 mg total) by mouth 2 (two) times daily. 180 capsule 1   Semaglutide, 2 MG/DOSE, 8 MG/3ML SOPN Inject 2 mg as directed once a week. 3 mL 5   ALPRAZolam (XANAX) 1 MG tablet Take 1-2 tablets 30 minutes prior to MRI, may repeat once as needed. Must have driver. (Patient not taking: Reported on 08/01/2023) 3 tablet 0   No current facility-administered medications for this visit.    PAST MEDICAL  HISTORY: Past Medical History:  Diagnosis Date   Acute pain of left shoulder 07/20/2021   improved with PT   Allergy 1990   Arthritis 2010   Bilateral hip pain 07/20/2021   bursitis - PT helped   Colon polyps    Coronary artery disease    COVID-19    Depression 03/29/2020   Formatting of this note might be different from the original. Buproprion XL 150 mg qd   Diabetes mellitus type 2 in obese    Diverticulitis    sigmoid   Fatty liver    noted on renal ultrasound 12/2021   GERD (gastroesophageal reflux disease)    Glaucoma    Gout    Gross hematuria 12/2021   renal ultrasound normal   Heart disease    Hypercholesteremia    Hypertension    Hypothyroid    Kidney stones 09/20/2016   x2, 1888 and 2070   Myocardial infarction [facility])    Neuromuscular disorder (HCC) 1995   Neuropathy    Sleep apnea    Dr. Betti Cruz   Stroke [facility]) 2002   TIA   TIA (transient ischemic attack)    Torn meniscus 01/29/2006   Left knee.   Type 2 diabetes mellitus with hyperlipidemia (HCC)     PAST SURGICAL HISTORY: Past Surgical History:  Procedure Laterality Date   CARDIAC CATHETERIZATION  02/16/2012   [facility]   CARPAL TUNNEL RELEASE Bilateral    Left-01/15/2016.  Right-03/11/2016.   COLONOSCOPY W/ POLYPECTOMY  12/20/2016   4 mm sessile tubular adenoma w/multifoci high grade dysplasia @65  cm   CORONARY ANGIOPLASTY WITH STENT PLACEMENT  09/16/2010   MI-cardiac catheterization-1 stent-OM.  [facility]   EYE SURGERY  2017   KNEE ARTHROSCOPY Left 01/29/2006   Torn meniscus   LESION EXCISION Left 04/01/2009   Benign.  [facility]   MOHS SURGERY      FAMILY HISTORY: Family History  Problem Relation Age of Onset   Depression Mother    Diabetes Mother    Hyperlipidemia Mother    Hypertension Mother    Hypertension Father    Depression Sister    Hyperlipidemia Sister    Hypertension Sister    Alcohol abuse Brother    Diabetes Brother     Heart attack Brother    Heart disease Brother    Hyperlipidemia Brother    Hypertension Brother    Breast cancer Maternal Grandmother    Kidney disease Maternal Grandfather    Stroke Paternal Grandmother    Stroke Paternal Grandfather    Heart disease Brother    Hyperlipidemia Brother    Hypertension Brother     SOCIAL HISTORY: Social History   Socioeconomic History   Marital status: Married    Spouse name: Not on file   Number of children: Not on file   Years of education: Not on file   Highest education  level: Master's degree (e.g., MA, MS, MEng, MEd, MSW, MBA)  Occupational History   Not on file  Tobacco Use   Smoking status: Former    Current packs/day: 0.00    Average packs/day: 3.0 packs/day for 5.0 years (15.0 ttl pk-yrs)    Types: Cigarettes    Start date: 02/11/1975    Quit date: 02/11/1980    Years since quitting: 43.4   Smokeless tobacco: Never  Vaping Use   Vaping status: Never Used  Substance and Sexual Activity   Alcohol use: Yes    Alcohol/week: 2.0 standard drinks of alcohol    Types: 2 Cans of beer per week    Comment: 1-2 drinks a week   Drug use: No   Sexual activity: Not Currently  Other Topics Concern   Not on file  Social History Narrative   Marital status/children/pets: Married.   Education/employment: Masters degree.  Retired from [employer].   Safety:      -smoke alarm in the home:Yes     - wears seatbelt: Yes      Social Determinants of Health   Financial Resource Strain: Low Risk  (06/22/2023)   Overall Financial Resource Strain (CARDIA)    Difficulty of Paying Living Expenses: Not hard at all  Food Insecurity: No Food Insecurity (06/22/2023)   Hunger Vital Sign    Worried About Running Out of Food in the Last Year: Never true    Ran Out of Food in the Last Year: Never true  Transportation Needs: No Transportation Needs (06/22/2023)   PRAPARE - Administrator, Civil Service (Medical): No    Lack of  Transportation (Non-Medical): No  Physical Activity: Insufficiently Active (06/22/2023)   Exercise Vital Sign    Days of Exercise per Week: 3 days    Minutes of Exercise per Session: 30 min  Stress: No Stress Concern Present (06/22/2023)   [employer] of Occupational Health - Occupational Stress Questionnaire    Feeling of Stress : Only a little  Social Connections: Moderately Integrated (06/22/2023)   Social Connection and Isolation Panel [NHANES]    Frequency of Communication with Friends and Family: More than three times a week    Frequency of Social Gatherings with Friends and Family: Three times a week    Attends Religious Services: Never    Active Member of Clubs or Organizations: No    Attends Banker Meetings: 1 to 4 times per year    Marital Status: Married  Recent Concern: Social Connections - Moderately Isolated (04/04/2023)   Social Connection and Isolation Panel [NHANES]    Frequency of Communication with Friends and Family: Once a week    Frequency of Social Gatherings with Friends and Family: More than three times a week    Attends Religious Services: Never    Database administrator or Organizations: No    Attends Engineer, structural: Not on file    Marital Status: Married  Catering manager Violence: Not At Risk (06/22/2023)   Humiliation, Afraid, Rape, and Kick questionnaire    Fear of Current or Ex-Partner: No    Emotionally Abused: No    Physically Abused: No    Sexually Abused: No      Levert Feinstein, M.D. Ph.D.  [facility] [address], [address] [place], [place] [address] Ph: [phone] Fax: [phone]  CC:  Natalia Leatherwood, DO [address]  [place] [address]  Claiborne Billings, Renee A, DO

## 2023-08-01 NOTE — Telephone Encounter (Signed)
sent to GI they obtain [employer] medicare auth [id number] 

## 2023-08-03 ENCOUNTER — Telehealth: Payer: Self-pay | Admitting: Neurology

## 2023-08-03 NOTE — Telephone Encounter (Signed)
PT referral faxed to [facility] in [place] (fax# [phone], phone# [phone])

## 2023-08-11 ENCOUNTER — Ambulatory Visit: Payer: Medicare HMO | Admitting: Podiatry

## 2023-08-11 ENCOUNTER — Encounter: Payer: Self-pay | Admitting: Podiatry

## 2023-08-11 DIAGNOSIS — E785 Hyperlipidemia, unspecified: Secondary | ICD-10-CM | POA: Diagnosis not present

## 2023-08-11 DIAGNOSIS — L84 Corns and callosities: Secondary | ICD-10-CM

## 2023-08-11 DIAGNOSIS — M79674 Pain in right toe(s): Secondary | ICD-10-CM

## 2023-08-11 DIAGNOSIS — M79675 Pain in left toe(s): Secondary | ICD-10-CM | POA: Diagnosis not present

## 2023-08-11 DIAGNOSIS — E1169 Type 2 diabetes mellitus with other specified complication: Secondary | ICD-10-CM

## 2023-08-11 DIAGNOSIS — B351 Tinea unguium: Secondary | ICD-10-CM | POA: Diagnosis not present

## 2023-08-11 NOTE — Progress Notes (Signed)
  Subjective:  Patient ID: Juan Stanton, male    DOB: Dec 05, 1945,  MRN: [id number]  Chief Complaint  Patient presents with   [facility]    [facility] patient doing well just here for routine foot care.    77 y.o. male returns with the above complaint. History confirmed with patient.  Still doing well, no new issues.   Nails are thick and elongated.   Some callus buildup now as well  Objective:  Physical Exam: warm, good capillary refill, no trophic changes or ulcerative lesions and normal DP and PT pulses.  Absent light touch sensation and protective sensation distally.  He has nail dystrophy and residual onychomycosis of all nails with thickening dystrophy and subungual debris.  Submetatarsal 5 callus bilateral  Assessment:   1. Pain due to onychomycosis of toenails of both feet   2. Callus of foot   3. Type 2 diabetes mellitus with hyperlipidemia (HCC)        Plan:  Patient was evaluated and treated and all questions answered.  Discussed the etiology and treatment options for the condition in detail with the patient.  Prior debridement has been helpful in reducing pain and improving function. Recommended debridement of the nails today. Sharp and mechanical debridement performed of all painful and mycotic nails today. Nails debrided in length and thickness using a nail nipper. Discussed treatment options including appropriate shoe gear. Follow up as needed for painful nails.   All symptomatic hyperkeratoses were safely debrided with a sterile #15 blade to patient's level of comfort without incident. We discussed preventative and palliative care of these lesions including supportive and accommodative shoegear, padding, prefabricated and custom molded accommodative orthoses, use of a pumice stone and lotions/creams daily.   Return in about 3 months (around 11/11/2023) for at risk diabetic foot care.

## 2023-08-15 ENCOUNTER — Telehealth: Payer: Self-pay

## 2023-08-15 IMAGING — US US RENAL
1 series · 14 of 25 positions shown · non-contrast
Comparison: None.

CLINICAL DATA: Hematuria

EXAM:
RENAL / URINARY TRACT ULTRASOUND COMPLETE

[Series 1: us renal · 14 of 41 slices shown]
[im 1/41]
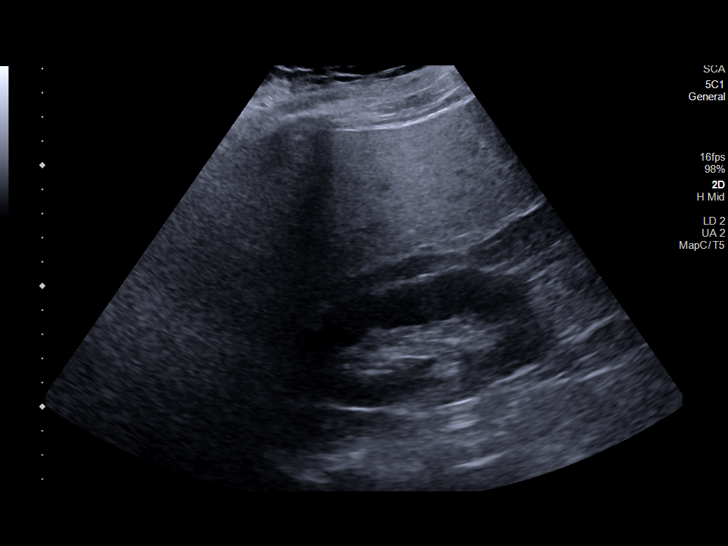
[im 4/41]
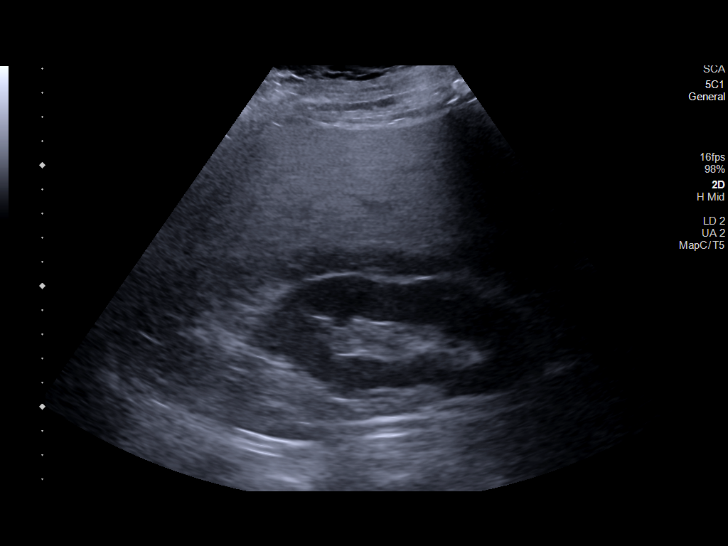
[im 7/41]
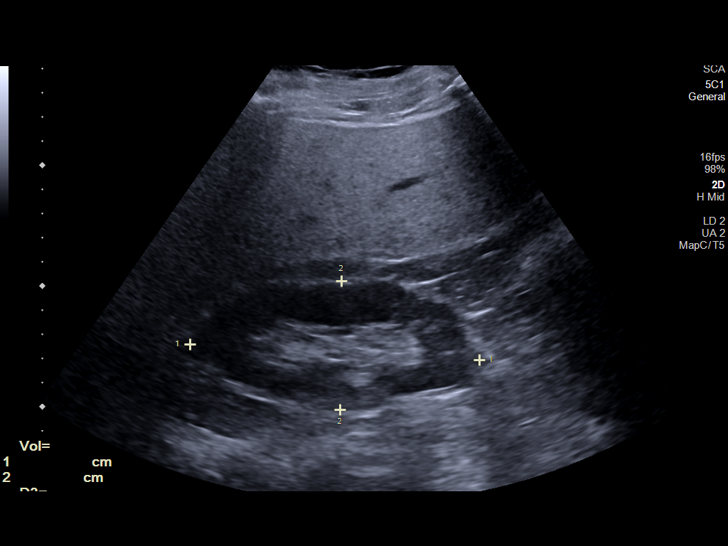
[im 11/41]
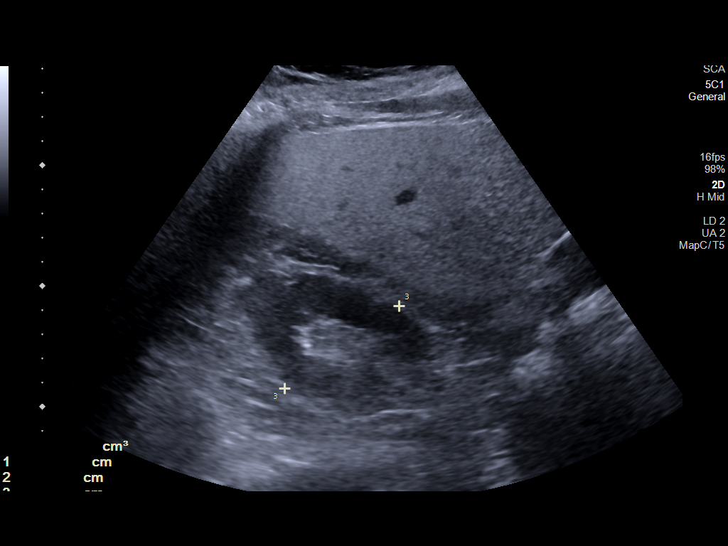
[im 14/41]
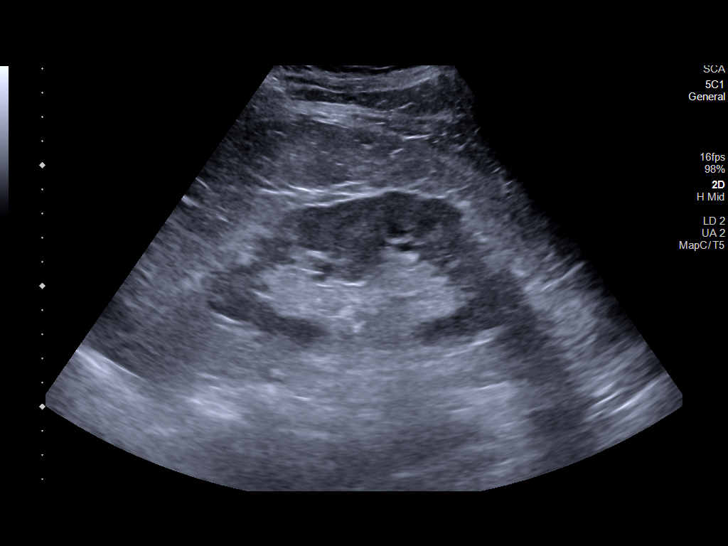
[im 16/41]
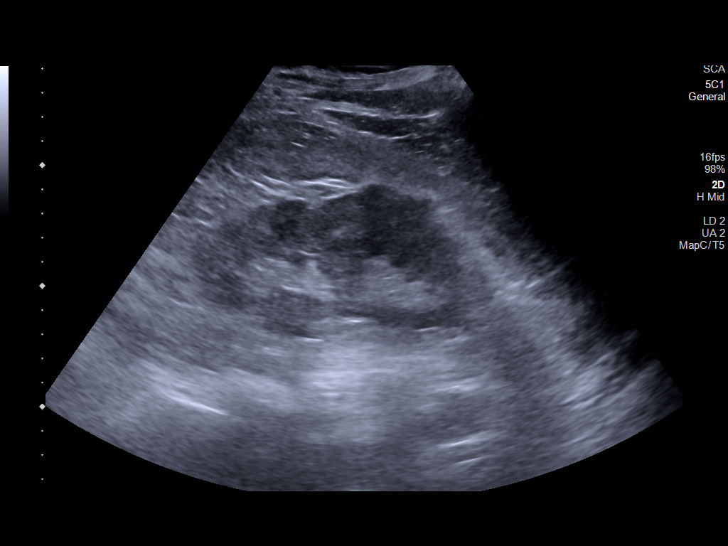
[im 19/41]
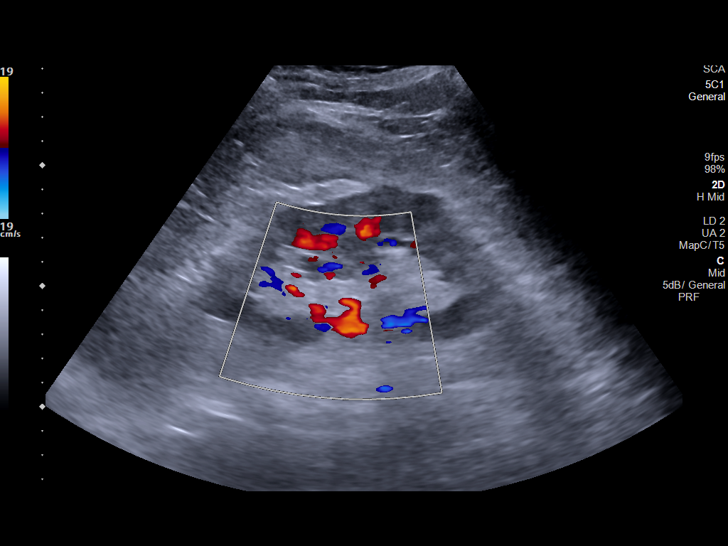
[im 22/41]
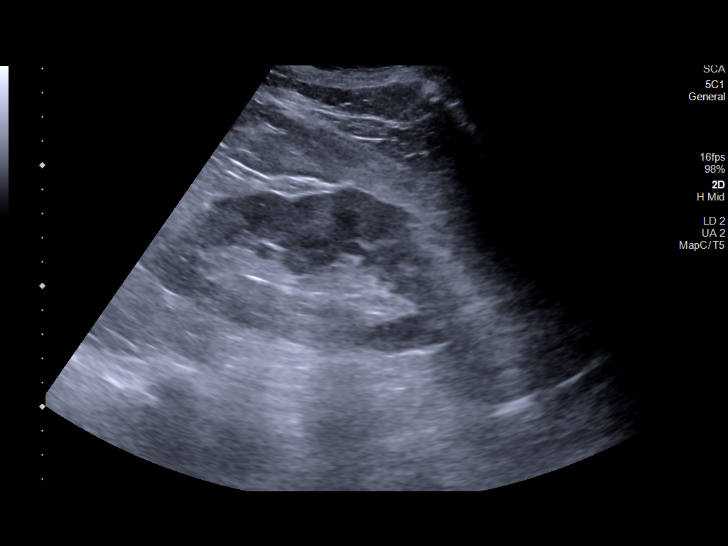
[im 26/41]
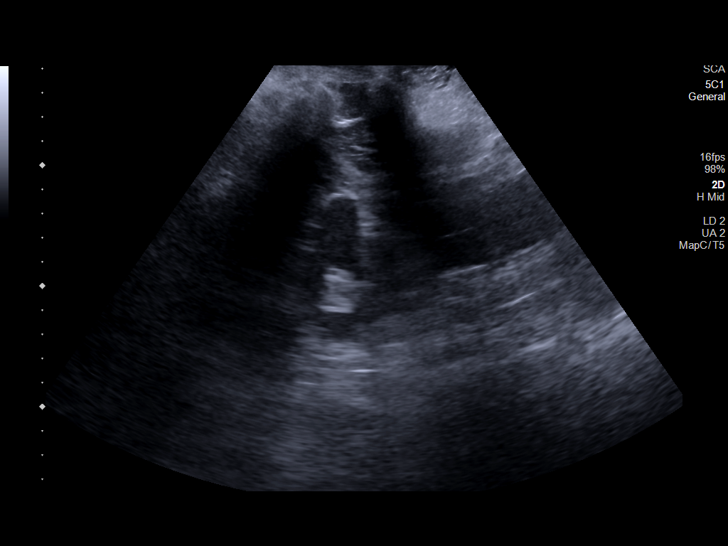
[im 27/41]
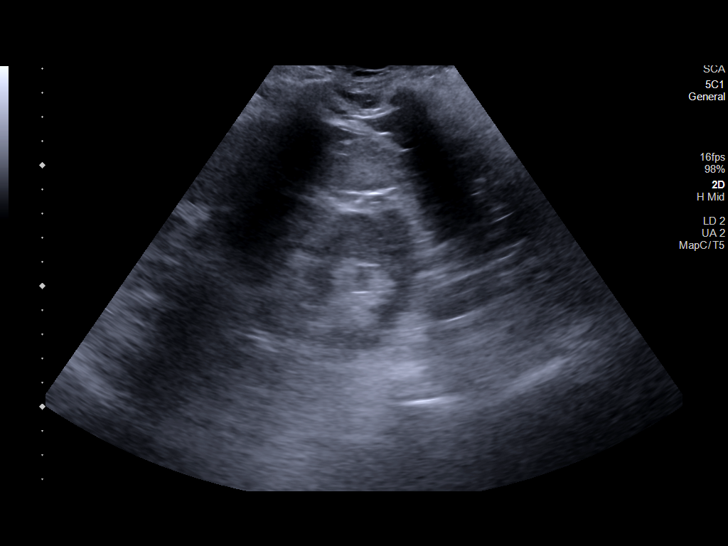
[im 31/41]
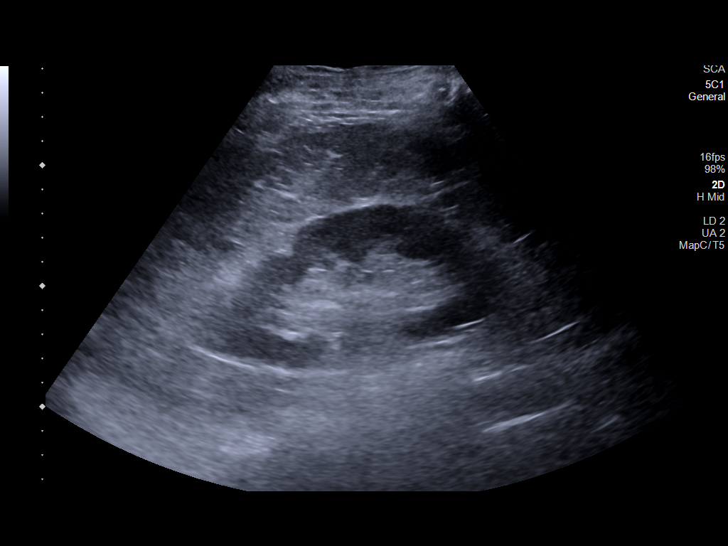
[im 34/41]
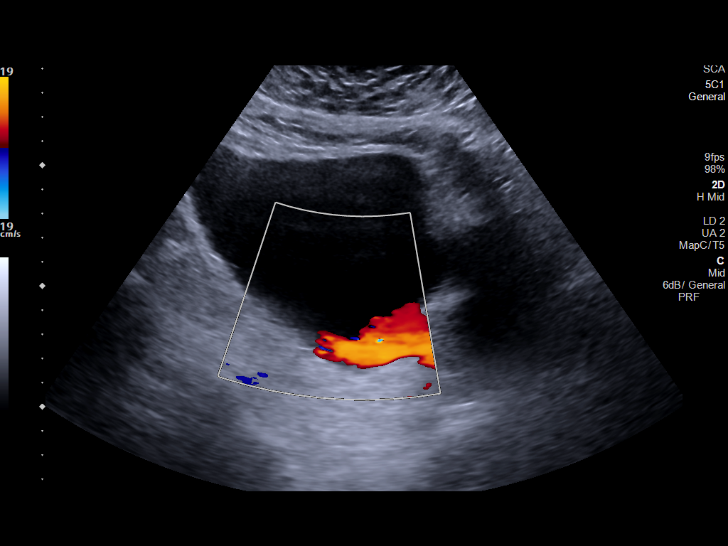
[im 37/41]
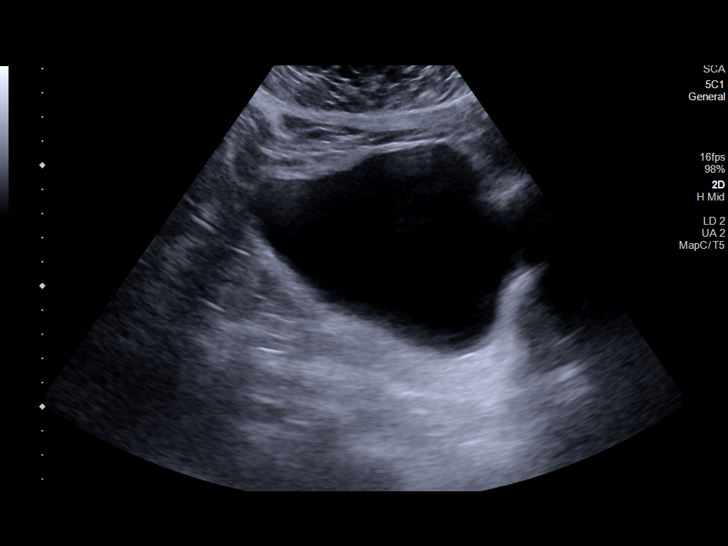
[im 41/41]
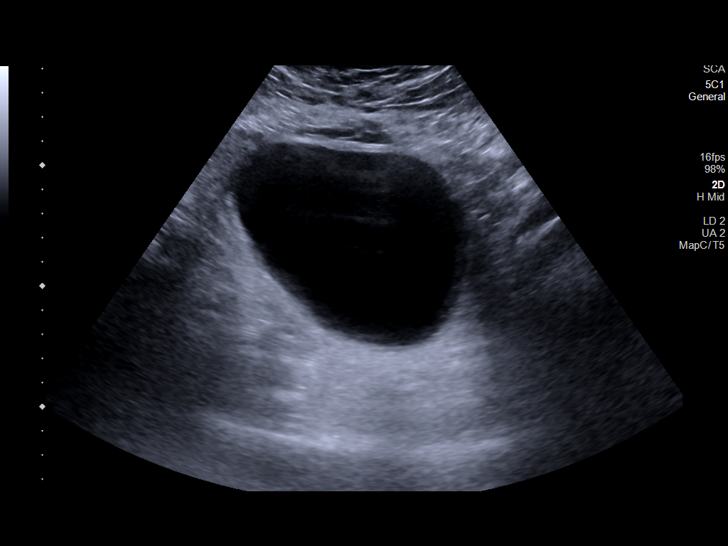

[14 of 25 positions shown; findings below may reference images not displayed]

FINDINGS: Right Kidney:

Renal measurements: 12 x 5.3 x 5.8 cm = volume: 196 mL. Echogenicity
within normal limits. No mass or hydronephrosis visualized.

Left Kidney:

Renal measurements: 12.4 x 6.7 x 5.1 cm = volume: 226 mL.
Echogenicity within normal limits. No mass or hydronephrosis
visualized.

Bladder:

Appears normal for degree of bladder distention.

Other:

Increased echogenicity of the liver parenchyma.
IMPRESSION: 1. Normal sonographic appearance of the kidneys and bladder.
2. Increased echogenicity of the liver parenchyma which may
represent hepatic steatosis.

## 2023-08-15 NOTE — Telephone Encounter (Signed)
I let infusion know, they will let us know if anything additional is needed

## 2023-08-15 NOTE — Telephone Encounter (Signed)
He just saw Dr. Terrace Arabia 08/01/23.

## 2023-08-15 NOTE — Telephone Encounter (Signed)
Dr. Terrace Arabia & Maralyn Sago,  When could you work him in? And would you order renal labs?

## 2023-08-18 ENCOUNTER — Encounter: Payer: Self-pay | Admitting: Neurology

## 2023-08-22 ENCOUNTER — Telehealth: Payer: Self-pay

## 2023-08-22 ENCOUNTER — Encounter: Payer: Self-pay | Admitting: Neurology

## 2023-08-22 ENCOUNTER — Other Ambulatory Visit: Payer: Self-pay | Admitting: Family Medicine

## 2023-08-22 NOTE — Telephone Encounter (Signed)
Pt eval order sent

## 2023-08-24 ENCOUNTER — Encounter: Payer: Self-pay | Admitting: Family Medicine

## 2023-08-24 NOTE — Telephone Encounter (Signed)
Care team updated and letter sent for eye exam notes.

## 2023-08-30 ENCOUNTER — Ambulatory Visit
Admission: RE | Admit: 2023-08-30 | Discharge: 2023-08-30 | Disposition: A | Discharge status: Home / Self Care | Payer: Medicare HMO | Source: Ambulatory Visit | Attending: Neurology | Admitting: Neurology

## 2023-08-30 ENCOUNTER — Other Ambulatory Visit: Payer: Self-pay | Admitting: Family Medicine

## 2023-08-30 ENCOUNTER — Other Ambulatory Visit: Payer: Medicare HMO

## 2023-08-30 DIAGNOSIS — H532 Diplopia: Secondary | ICD-10-CM

## 2023-08-30 DIAGNOSIS — R269 Unspecified abnormalities of gait and mobility: Secondary | ICD-10-CM

## 2023-08-30 DIAGNOSIS — G629 Polyneuropathy, unspecified: Secondary | ICD-10-CM

## 2023-08-31 ENCOUNTER — Other Ambulatory Visit: Payer: Self-pay

## 2023-08-31 DIAGNOSIS — R269 Unspecified abnormalities of gait and mobility: Secondary | ICD-10-CM

## 2023-08-31 DIAGNOSIS — G629 Polyneuropathy, unspecified: Secondary | ICD-10-CM

## 2023-09-01 LAB — BASIC METABOLIC PANEL
BUN/Creatinine Ratio: 20 (ref 10–24)
BUN: 19 mg/dL (ref 8–27)
CO2: 16 mmol/L — ABNORMAL LOW (ref 20–29)
Calcium: 8.7 mg/dL (ref 8.6–10.2)
Chloride: 106 mmol/L (ref 96–106)
Creatinine, Ser: 0.94 mg/dL (ref 0.76–1.27)
Glucose: 123 mg/dL — ABNORMAL HIGH (ref 70–99)
Sodium: 138 mmol/L (ref 134–144)
eGFR: 85 mL/min/{1.73_m2} (ref >59)

## 2023-09-07 ENCOUNTER — Other Ambulatory Visit: Payer: Self-pay | Admitting: Family Medicine

## 2023-09-08 ENCOUNTER — Telehealth: Payer: Self-pay

## 2023-09-08 NOTE — Telephone Encounter (Signed)
Pt orders faxed

## 2023-09-12 ENCOUNTER — Other Ambulatory Visit: Payer: Self-pay | Admitting: Family Medicine

## 2023-09-13 MED ORDER — PREGABALIN 150 MG PO CAPS: 150.0000 mg | ORAL_CAPSULE | Freq: Two times a day (BID) | ORAL | 0 refills | Status: DC

## 2023-09-13 NOTE — Addendum Note (Signed)
Addended by: Felix Pacini A on: 09/13/2023 11:43 AM   Modules accepted: Orders

## 2023-09-13 NOTE — Telephone Encounter (Signed)
Please call patient: I have received a refill request Lyrica. Please ask patient if he has enough of this medicine to get to his appointment which is scheduled January 27.  If he does then we will wait till his appointment to refill this medication.  If he does not please send refill request back to me and we will provide him with a bridge.

## 2023-09-19 ENCOUNTER — Other Ambulatory Visit: Payer: Self-pay | Admitting: Family Medicine

## 2023-10-04 ENCOUNTER — Ambulatory Visit: Payer: Medicare HMO | Admitting: Dermatology

## 2023-10-04 ENCOUNTER — Encounter: Payer: Self-pay | Admitting: Dermatology

## 2023-10-04 VITALS — BP 154/102

## 2023-10-04 DIAGNOSIS — L578 Other skin changes due to chronic exposure to nonionizing radiation: Secondary | ICD-10-CM

## 2023-10-04 DIAGNOSIS — D229 Melanocytic nevi, unspecified: Secondary | ICD-10-CM

## 2023-10-04 DIAGNOSIS — D225 Melanocytic nevi of trunk: Secondary | ICD-10-CM | POA: Diagnosis not present

## 2023-10-04 DIAGNOSIS — Z1283 Encounter for screening for malignant neoplasm of skin: Secondary | ICD-10-CM | POA: Diagnosis not present

## 2023-10-04 DIAGNOSIS — W908XXA Exposure to other nonionizing radiation, initial encounter: Secondary | ICD-10-CM

## 2023-10-04 DIAGNOSIS — L820 Inflamed seborrheic keratosis: Secondary | ICD-10-CM | POA: Diagnosis not present

## 2023-10-04 DIAGNOSIS — D485 Neoplasm of uncertain behavior of skin: Secondary | ICD-10-CM

## 2023-10-04 DIAGNOSIS — D1801 Hemangioma of skin and subcutaneous tissue: Secondary | ICD-10-CM | POA: Diagnosis not present

## 2023-10-04 DIAGNOSIS — Z85828 Personal history of other malignant neoplasm of skin: Secondary | ICD-10-CM

## 2023-10-04 DIAGNOSIS — L814 Other melanin hyperpigmentation: Secondary | ICD-10-CM

## 2023-10-04 DIAGNOSIS — D492 Neoplasm of unspecified behavior of bone, soft tissue, and skin: Secondary | ICD-10-CM | POA: Diagnosis not present

## 2023-10-04 DIAGNOSIS — L853 Xerosis cutis: Secondary | ICD-10-CM

## 2023-10-04 DIAGNOSIS — L821 Other seborrheic keratosis: Secondary | ICD-10-CM

## 2023-10-04 NOTE — Progress Notes (Signed)
 New Patient Visit   Subjective  Juan Stanton is a 79 y.o. 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 The patient has spots, moles and lesions to be evaluated, some may be new or changing and the patient may have concern these could be cancer.    The following portions of the chart were reviewed this encounter and updated as appropriate: medications, allergies, medical history  Review of Systems:  No other skin or systemic complaints except as noted in HPI or Assessment and Plan.  Objective  Well appearing patient in no apparent distress; mood and affect are within normal limits.  A full examination was performed including scalp, head, eyes, ears, nose, lips, neck, chest, axillae, abdomen, back, buttocks, bilateral upper extremities, bilateral lower extremities, hands, feet, fingers, toes, fingernails, and toenails. All findings within normal limits unless otherwise noted below.   Relevant physical exam findings are noted in the Assessment and Plan.  Right lat back 5 mm black macule  Back, right cheek (7) Erythematous stuck-on, waxy papule or plaque  Assessment & Plan   SKIN CANCER SCREENING PERFORMED TODAY.  ACTINIC DAMAGE - Chronic condition, secondary to cumulative UV/sun exposure - diffuse scaly erythematous macules with underlying dyspigmentation - Recommend daily broad spectrum sunscreen SPF 30+ to sun-exposed areas, reapply every 2 hours as needed.  - Staying in the shade or wearing long sleeves, sun glasses (UVA+UVB protection) and wide brim hats (4-inch brim around the entire circumference of the hat) are also recommended for sun protection.  - Call for new or changing lesions.  LENTIGINES, SEBORRHEIC KERATOSES, HEMANGIOMAS - Benign normal skin lesions -  Benign-appearing - Call for any changes  MELANOCYTIC NEVI - Tan-brown and/or pink-flesh-colored symmetric macules and papules - Benign appearing on exam today - Observation - Call clinic for new or changing moles - Recommend daily use of broad spectrum spf 30+ sunscreen to sun-exposed areas.   HISTORY OF BASAL CELL CARCINOMA OF THE SKIN - No evidence of recurrence today - Recommend regular full body skin exams - Recommend daily broad spectrum sunscreen SPF 30+ to sun-exposed areas, reapply every 2 hours as needed.  - Call if any new or changing lesions are noted between office visits  Xerosis of lower legs and feet - diffuse xerotic patches - recommend gentle, hydrating skin care - gentle skin care handout given     NEOPLASM OF UNCERTAIN BEHAVIOR OF SKIN Right lat back Skin / nail biopsy Type of biopsy: tangential   Informed consent: discussed and consent obtained   Timeout: patient name, date of birth, surgical site, and procedure verified   Procedure prep:  Patient was prepped and draped in usual sterile fashion Prep type:  Isopropyl alcohol Anesthesia: the lesion was anesthetized in a standard fashion   Anesthetic:  1% lidocaine  w/ epinephrine 1-100,000 buffered w/ 8.4% NaHCO3 Instrument used: flexible razor blade   Hemostasis achieved with: pressure, aluminum chloride and electrodesiccation   Outcome: patient tolerated procedure well   Post-procedure details: sterile dressing applied and wound care instructions given   Dressing type: bandage and petrolatum   Specimen 1 - Surgical pathology Differential Diagnosis: R/O Dysplastic nevus  Check Margins: No INFLAMED SEBORRHEIC KERATOSIS (7) Back, right cheek (7) Symptomatic, irritating, patient would like treated.  Benign-appearing.  Call clinic for new or changing  lesions.   Destruction of lesion - Back, right cheek (7) Complexity: simple   Destruction method: cryotherapy   Informed consent: discussed and consent  obtained   Timeout:  patient name, date of birth, surgical site, and procedure verified Lesion destroyed using liquid nitrogen: Yes   Region frozen until ice ball extended beyond lesion: Yes   Outcome: patient tolerated procedure well with no complications   Post-procedure details: wound care instructions given    Return in about 1 year (around 10/03/2024) for TBSE.  I, Roseline Hutchinson, CMA, am acting as scribe for [employer], DO .   Documentation: I have reviewed the above documentation for accuracy and completeness, and I agree with the above.  Delon Lenis, DO

## 2023-10-04 NOTE — Patient Instructions (Signed)

## 2023-10-05 LAB — SURGICAL PATHOLOGY

## 2023-10-16 ENCOUNTER — Other Ambulatory Visit: Payer: Self-pay | Admitting: Family Medicine

## 2023-10-17 ENCOUNTER — Other Ambulatory Visit (HOSPITAL_COMMUNITY): Payer: Self-pay

## 2023-10-24 ENCOUNTER — Encounter: Payer: Medicare HMO | Admitting: Family Medicine

## 2023-10-26 ENCOUNTER — Ambulatory Visit (INDEPENDENT_AMBULATORY_CARE_PROVIDER_SITE_OTHER): Payer: Medicare HMO | Admitting: Neurology

## 2023-10-26 ENCOUNTER — Ambulatory Visit: Payer: Self-pay | Admitting: Neurology

## 2023-10-26 VITALS — BP 142/99 | HR 78 | Ht 69.0 in

## 2023-10-26 DIAGNOSIS — H532 Diplopia: Secondary | ICD-10-CM | POA: Diagnosis not present

## 2023-10-26 DIAGNOSIS — R269 Unspecified abnormalities of gait and mobility: Secondary | ICD-10-CM

## 2023-10-26 DIAGNOSIS — G629 Polyneuropathy, unspecified: Secondary | ICD-10-CM

## 2023-10-26 DIAGNOSIS — Z0289 Encounter for other administrative examinations: Secondary | ICD-10-CM

## 2023-10-26 NOTE — Progress Notes (Signed)
Full Name: Juan Stanton Gender: Male MRN #: [id number] Date of Birth: Jan 19, 1946    Visit Date: 01/26/2023 09:26 Age: 78 Years Examining Physician: Dr. Levert Feinstein Referring Physician: Dr. Levert Feinstein Height: 5 feet 9 inch History: 78 year old male with progressive worsening gait abnormality lower extremity paresthesia  Summary of the test:  Nerve conduction study: Bilateral sural, superficial peroneal, left median,, radial, bilateral ulnar sensory responses were absent.  Right radial sensory response showed significantly decreased snap amplitude.  Bilateral tibial, peroneal to EDB motor responses were absent.  Bilateral ulnar motor response showed normal CMAP amplitude, with significantly prolonged F-wave latency.  Left median motor response showed normal distal latency, CMAP amplitude, also with moderately prolonged F-wave latency.  Electromyography: Selected needle examinations of right lower extremity muscles, lumbosacral paraspinals; bilateral upper extremity muscles, cervical paraspinal muscles were performed.  There was evidence of mild chronic neuropathic changes involving right distal lower extremity muscles.  There was no spontaneous activity at bilateral lumbosacral paraspinal muscles.  There was also evidence of mild chronic neuropathic changes involving right upper extremity distal muscles.  Conclusion: This is an abnormal study.  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   Levert Feinstein. M.D. Ph.D.   [facility] [address], [address] [place], [place] [address] Tel: [phone] Fax: [phone]  Verbal informed consent was obtained from the patient, patient was informed of potential risk of procedure, including bruising, bleeding, hematoma formation, infection, muscle weakness,  muscle pain, numbness, among others.        MNC    Nerve / Sites Muscle Latency Ref. Amplitude Ref. Rel Amp Segments Distance Velocity Ref. Area    ms ms mV mV %  cm m/s m/s mVms  L Median - APB     Wrist APB 4.0 <=4.4 4.3 >=4.0 100 Wrist - APB 7   19.6     Upper arm APB 11.4  4.1  94.1 Upper arm - Wrist 29 39 >=49 17.9  L Ulnar - ADM     Wrist ADM 3.5 <=3.3 5.5 >=6.0 100 Wrist - ADM 7   20.2     B.Elbow ADM 5.5  2.2  39.5 B.Elbow - Wrist 10 51 >=49 4.6     A.Elbow ADM 10.5  5.0  228 A.Elbow - B.Elbow 23 46 >=49 20.4  R Ulnar - ADM     Wrist ADM 3.6 <=3.3 7.3 >=6.0 100 Wrist - ADM 9   23.2     B.Elbow ADM 6.4  6.2  85 B.Elbow - Wrist 17 60 >=49 21.4     A.Elbow ADM 9.6  6.1  98.4 A.Elbow - B.Elbow 15 46 >=49 22.0  L Peroneal - EDB     Ankle EDB NR <=6.5 NR >=2.0 NR Ankle - EDB 9   NR         Pop fossa - Ankle      R Peroneal - EDB     Ankle EDB NR <=6.5 NR >=2.0 NR Ankle - EDB 9   NR         Pop fossa - Ankle      L Tibial - AH     Ankle AH NR <=5.8 NR >=4.0 NR Ankle - AH 9   NR  R Tibial - AH     Ankle AH NR <=5.8 NR >=4.0 NR Ankle - AH 9  NR                   SNC    Nerve / Sites Rec. Site Peak Lat Ref.  Amp Ref. Segments Distance    ms ms V V  cm  L Radial - Anatomical snuff box (Forearm)     Forearm Wrist NR <=2.9 NR >=15 Forearm - Wrist 10  R Radial - Anatomical snuff box (Forearm)     Forearm Wrist 2.8 <=2.9 3 >=15 Forearm - Wrist 10  L Sural - Ankle (Calf)     Calf Ankle NR <=4.4 NR >=6 Calf - Ankle 14  R Sural - Ankle (Calf)     Calf Ankle NR <=4.4 NR >=6 Calf - Ankle 14  L Superficial peroneal - Ankle     Lat leg Ankle NR <=4.4 NR >=6 Lat leg - Ankle 14  R Superficial peroneal - Ankle     Lat leg Ankle NR <=4.4 NR >=6 Lat leg - Ankle 14  L Median - Orthodromic (Dig II, Mid palm)     Dig II Wrist NR <=3.4 NR >=10 Dig II - Wrist 13  L Ulnar - Orthodromic, (Dig V, Mid palm)     Dig V Wrist NR <=3.1 NR >=5 Dig V - Wrist 11  R Ulnar - Orthodromic, (Dig V, Mid  palm)     Dig V Wrist NR <=3.1 NR >=5 Dig V - Wrist 89                       F  Wave    Nerve F Lat Ref.   ms ms  L Ulnar - ADM 38.3 <=32.0  L Median - APB 36.5 <=31.0  R Ulnar - ADM 35.5 <=32.0           EMG Summary Table    Spontaneous MUAP Recruitment  Muscle IA Fib PSW Fasc Other Amp Dur. Poly Pattern  R. Tibialis anterior Normal None None None _______ Normal Normal Normal Reduced  R. Tibialis posterior Normal None None None _______ Normal Normal Normal Reduced  R. Peroneus longus Normal None None None _______ Normal Normal Normal Reduced  R. Gastrocnemius (Medial head) Normal None None None _______ Normal Normal Normal Reduced  R. Vastus lateralis Normal None None None _______ Normal Normal Normal Reduced  R. Lumbar paraspinals (low) Normal None None None _______ Normal Normal Normal Normal  R. Lumbar paraspinals (mid) Normal None None None _______ Normal Normal Normal Normal  R. First dorsal interosseous Normal None None None _______ Normal Normal Normal Reduced  R. Pronator teres Normal None None None _______ Normal Normal Normal Normal  R. Biceps brachii Normal None None None _______ Normal Normal Normal Normal  R. Deltoid Normal None None None _______ Normal Normal Normal Normal  R. Triceps brachii Normal None None None _______ Normal Normal Normal Normal  R. Brachioradialis Normal None None None _______ Normal Normal Normal Normal  R. Cervical paraspinals Normal None None None _______ Normal Normal Normal Normal  L. First dorsal interosseous Normal None None None _______ Normal Normal Normal Reduced  L. Pronator teres Normal None None None _______ Normal Normal Normal Normal  L. Biceps brachii Normal None None None _______ Normal Normal Normal Normal  L. Deltoid Normal None None None _______ Normal Normal Normal Normal  L. Triceps brachii Normal None None None _______ Normal Normal Normal Normal  L. Brachioradialis Normal None None None _______ Normal Normal Normal Normal   L.  Extensor digitorum communis Normal None None None _______ Normal Normal Normal Normal  L. Cervical paraspinals Normal None None None _______ Normal Normal Normal Normal

## 2023-10-26 NOTE — Procedures (Signed)
Full Name: Juan Stanton Gender: Male MRN #: [id number] Date of Birth: July 13, 1946    Visit Date: 10/26/2023 09:22 Age: 78 Years Examining Physician: Dr. Levert Feinstein Referring Physician: Dr. Levert Feinstein Height: 5 feet 9 inch History: 78 year old male with long history of peripheral neuropathy, worsening low back pain, gait abnormality  Summary of the test: Nerve conduction study: Right sural, superficial peroneal, bilateral median, ulnar sensory responses were absent.  Bilateral radial sensory responses showed normal peak latency moderately decreased snap amplitude.  Right tibial, peroneal to EDB, left ulnar, bilateral median motor responses were absent.  Right ulnar motor response showed mildly decreased CMAP amplitude.  Right ulnar F-wave latency was mild to moderately prolonged  Electromyography: Selected needle examinations of bilateral lower extremity muscles showed evidence of active chronic neuropathic changes, involving distal left more than proximal muscles.  There was also mild chronic neuropathic changes involving right upper extremity distal muscles.  Needle examination was not performed at paraspinal muscle due to increased bleeding, he is on antiplatelet,  Conclusion: This is an abnormal study.  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  Compared to previous study in May 2024, there was no improvement.   Levert Feinstein. M.D. Ph.D.   [facility] [address] [place], [place] [address] Tel: [phone] Fax: [phone]  Verbal informed consent was obtained from the patient, patient was informed of potential risk of procedure, including bruising, bleeding, hematoma formation, infection, muscle weakness, muscle pain, numbness, among others.        MNC    Nerve / Sites Muscle Latency Ref. Amplitude Ref. Rel Amp Segments  Distance Velocity Ref. Area    ms ms mV mV %  cm m/s m/s mVms  R Median - APB     Wrist APB NR <=4.4 NR >=4.0 NR Wrist - APB 7   NR  L Median - APB     Wrist APB NR <=4.4 NR >=4.0 NR Wrist - APB 7   NR  R Ulnar - ADM     Wrist ADM 3.1 <=3.3 5.8 >=6.0 100 Wrist - ADM 7   20.8     B.Elbow ADM 5.1  3.1  53.8 B.Elbow - Wrist 10 49 >=49 10.6     A.Elbow ADM 9.8  5.0  162 A.Elbow - B.Elbow 23 49 >=49 19.9  L Ulnar - ADM     Wrist ADM NR <=3.3 NR >=6.0 NR Wrist - ADM 7   NR  R Peroneal - EDB     Ankle EDB NR <=6.5 NR >=2.0 NR Ankle - EDB 9   NR     Fib head EDB      Fib head - Ankle   >=44          Pop fossa - Ankle      R Tibial - AH     Ankle AH NR <=5.8 NR >=4.0 NR Ankle - AH 9   NR                 SNC    Nerve / Sites Rec. Site Peak Lat Ref.  Amp Ref. Segments Distance    ms ms V V  cm  R Radial - Anatomical snuff box (Forearm)     Forearm Wrist 2.7 <=2.9 5 >=15 Forearm - Wrist 10  L Radial - Anatomical snuff box (Forearm)     Forearm Wrist 2.8 <=2.9 7 >=  15 Forearm - Wrist 10  R Sural - Ankle (Calf)     Calf Ankle NR <=4.4 NR >=6 Calf - Ankle 14  R Superficial peroneal - Ankle     Lat leg Ankle NR <=4.4 NR >=6 Lat leg - Ankle 14  R Median - Orthodromic (Dig II, Mid palm)     Dig II Wrist NR <=3.4 NR >=10 Dig II - Wrist 13  L Median - Orthodromic (Dig II, Mid palm)     Dig II Wrist NR <=3.4 NR >=10 Dig II - Wrist 13  R Ulnar - Orthodromic, (Dig V, Mid palm)     Dig V Wrist NR <=3.1 NR >=5 Dig V - Wrist 11  L Ulnar - Orthodromic, (Dig V, Mid palm)     Dig V Wrist NR <=3.1 NR >=5 Dig V - Wrist 62                     F  Wave    Nerve F Lat Ref.   ms ms  R Ulnar - ADM 33.4 <=32.0       EMG Summary Table    Spontaneous MUAP Recruitment  Muscle IA Fib PSW Fasc Other Amp Dur. Poly Pattern  R. Tibialis anterior Increased 1+ None None _______ Increased Increased 1+ Reduced  R. Tibialis posterior Increased 1+ None None _______ Increased Increased 1+ Reduced  R. Peroneus longus  Increased 1+ None None _______ Increased Increased 1+ Reduced  R. Gastrocnemius (Medial head) Increased 1+ None None _______ Increased Increased 1+ Reduced  R. Vastus lateralis Normal None None None _______ Normal Normal Normal Reduced  L. Tibialis anterior Increased 1+ None None _______ Increased Increased 1+ Reduced  L. Tibialis posterior Increased 1+ None None _______ Increased Increased 1+ Reduced  L. Peroneus longus Increased 1+ None None _______ Increased Increased 1+ Reduced  L. Gastrocnemius (Medial head) Increased None None None _______ Normal Normal Normal Reduced  L. Vastus lateralis Normal None None None _______ Normal Normal Normal Reduced  R. First dorsal interosseous Increased None None None _______ Increased Increased 1+ Reduced  R. Extensor digitorum communis Normal None None None _______ Normal Normal Normal Reduced  R. Pronator teres Normal None None None _______ Normal Normal Normal Normal  R. Biceps brachii Normal None None None _______ Normal Normal Normal Normal  R. Deltoid Normal None None None _______ Normal Normal Normal Normal  R. Triceps brachii Normal None None None _______ Normal Normal Normal Normal

## 2023-10-26 NOTE — Addendum Note (Signed)
Addended by: Levert Feinstein on: 10/26/2023 01:51 PM   Modules accepted: Orders

## 2023-10-26 NOTE — Procedures (Signed)
EMG/NCS from May 1st 2025, was under note session

## 2023-10-26 NOTE — Progress Notes (Signed)
No chief complaint on file.     ASSESSMENT AND PLAN  Juan Stanton is a 78 y.o. male   Intermittent binocular diplopia    MRI of the brain with without contrast in in February 2024 showed mild small vessel disease, generalized atrophy,  Acetylcholine receptor antibody was negative  Mestinon did not help,  A prescription of new Prisma has helped, most consistent with misalignment of extraocular muscles  Long history of peripheral neuropathy Gait abnormality  Slow worsening, ascending paresthesia, distal weakness, gait abnormality  EMG nerve conduction study confirmed moderately severe axonal sensorimotor polyneuropathy, mainly axonal with mild demyelinating features,  Laboratory evaluation showed no treatable etiology,    Spinal fluid testing showed elevated total protein of 77,  IVIG since June 2024 to December showed no significant improvement,  Repeat EMG nerve conduction study today and patient reported no improvement, will stop IVIG,  Neuropathic pain Cymbalta 60 mg daily, Lyrica 150 mg twice a day as needed  Lumbar spinal stenosis  Severe stenosis at L3 and 4 level, he denies significant pain, no bowel and bladder incontinence, does not want to consider surgical evaluation  DIAGNOSTIC DATA (LABS, IMAGING, TESTING) - I reviewed patient records, labs, notes, testing and imaging myself where available.   MEDICAL HISTORY:  Juan Stanton, is a 78 year old male,  seen in request by ophthalmologist Manning Charity for evaluation of double vision, his primary care physician is Dr.Kuneff, Ezequiel Essex, initial evaluation was on October 28, 2022 I reviewed and summarized the referring note. PMHX HLD Obesity Glaucoma, s/p laser HTN CAD, s/p stent Pre-Dm Neuropathy since 2009. Hypothyroid Quit smoke in 1980s, OSA-on CPAP. History of kidney stone Stroke,  He reported a history of right Bell's palsy many years ago, with mild residual right facial weakness, dyssynergic movement  of right facial muscles with chewing,  He also had a history of bilateral glaucoma, required laser surgery, since he moved from [place] to [place] in 2022, he has under current ophthalmology care  Since 2023, he noticed intermittent double vision, especially at the evening time, watching TV, the caption can become double, sometimes oblique, but the relative position often changes, he denies difficulty driving, denies difficulty swallowing, or limb muscle weakness  He had long history of bilateral lower extremity paresthesia, gradually getting worse, 2 brothers and 1 sister have it, his feet can be so bothersome, he has to take Lyrica 150 mg twice a day, Cymbalta 60 mg daily, otherwise he has difficulty sleeping, he denies significant gait abnormality, no upper extremity involvement  UPDATE Jan 26 2023: He continue complains of progressive worsening bilateral lower extremity numbness, tingling, burning pain, has been ongoing for at least 5 years, also began to have fingertips paresthesia  EMG nerve conduction study today confirmed moderately  severe axonal sensorimotor polyneuropathy  Extensive laboratory evaluations failed to demonstrate etiology, normal or negative MuSK antibody, acetylcholine receptor antibody, protein electrophoresis, B12, ANA, CPK, ESR C-reactive protein, vitamin D, TSH,, A1c,  UPDATE Nov 4th 2024,   Lumbar puncture in May 2024 total protein of 77 He began to receive IVIG since June 2024, tolerating infusion well, but denies significant improvement, actually complains worsening balance issues, hands weakness, difficulty opening jars, continue to have limb numbness   Update October 26, 2023: He return for electrodiagnostic study today, which demonstrate severe axonal sensorimotor neuropathy, with superimposed bilateral lumbosacral radiculopathy.  Personally reviewed MRI of the lumbar from December 2024, severe L3-4 stenosis, multilevel degenerative changes,  moderate canal stenosis at L1 -2,  variable degree of foraminal narrowing  MRI of the cervical spine showed multilevel degenerative changes, most noticeable at lower cervical region, no significant canal stenosis, variable degree of foraminal narrowing  He received IVIG treatment from June 2024 until December, for at least 6 months, he did not notice any significant improvement  He had flareup of low back pain, but denies bowel and bladder incontinence, slow worsening gait abnormality, with reported severe L3-3 spinal stenosis, he does not want to proceed with surgical evaluation  PHYSICAL EXAM:    10/26/2023    9:30 AM 10/04/2023   10:38 AM 08/01/2023   10:05 AM  Vitals with BMI  Height 5\' 9"   5\' 9"   Weight   276 lbs  BMI   40.74  Systolic 142 154 161  Diastolic 99 102 69  Pulse 78  90    PHYSICAL EXAMNIATION:  Gen: NAD, conversant, well nourised, well groomed                     Cardiovascular: Regular rate rhythm, no peripheral edema, warm, nontender. Eyes: Conjunctivae clear without exudates or hemorrhage Neck: Supple, no carotid bruits. Pulmonary: Clear to auscultation bilaterally   NEUROLOGICAL EXAM:  MENTAL STATUS: Speech/cognition: Awake, alert, oriented to history taking and casual conversation CRANIAL NERVES: CN II: Visual fields are full to confrontation. Pupils are round equal and briskly reactive to light. CN III, IV, VI: Extraocular movements were full, cover and uncover testing, red testing demonstrated bilateral exophoria.  Become more noticeable after muscle fatigue, CN V: Facial sensation is intact to light touch CN VII: Right facial dyssynergic movement, decreased right frontalis muscle movement, no significant eye closure CN VIII: Hearing is normal to causal conversation. CN IX, X: Phonation is normal. CN XI: Head turning and shoulder shrug are intact  MOTOR: Mild bilateral hand intrinsic muscle atrophy, mild finger abduction weakness, mild to moderate  bilateral toe flexion, extension weakness  REFLEXES: Areflexia  SENSORY: Length-dependent decreased vibratory sensation, light touch, pinprick below knee level  COORDINATION: There is no trunk or limb dysmetria noted.  GAIT/STANCE: He can get up from seated position arm crossed, cautious, mildly unsteady, could not stand up on tiptoes, heels, positive Romberg signs,  REVIEW OF SYSTEMS:  Full 14 system review of systems performed and notable only for as above All other review of systems were negative.   ALLERGIES: Allergies  Allergen Reactions   Celebrex [Celecoxib] Swelling   Iodine Nausea Only   Regadenoson Other (See Comments)    Had chest tightness, lightheadedness. This persisted after receiving aminophylline. Came to hospital, was having MI, stented    HOME MEDICATIONS: Current Outpatient Medications  Medication Sig Dispense Refill   acetaminophen (TYLENOL) 650 MG CR tablet Take by mouth.     allopurinol (ZYLOPRIM) 300 MG tablet TAKE 1 TABLET BY MOUTH DAILY 30 tablet 0   ALPRAZolam (XANAX) 1 MG tablet Take 1-2 tablets 30 minutes prior to MRI, may repeat once as needed. Must have driver. 3 tablet 0   aspirin 81 MG chewable tablet Chew by mouth daily.     atorvastatin (LIPITOR) 80 MG tablet Take 1 tablet (80 mg total) by mouth daily. 90 tablet 3   bimatoprost (LUMIGAN) 0.03 % ophthalmic solution 1 drop at bedtime.     brimonidine-timolol (COMBIGAN) 0.2-0.5 % ophthalmic solution Place 1 drop into both eyes every 12 (twelve) hours.     candesartan-hydrochlorothiazide (ATACAND HCT) 16-12.5 MG tablet Take 1 tablet by mouth daily. 90 tablet 1  clopidogrel (PLAVIX) 75 MG tablet Take 1 tablet (75 mg total) by mouth daily. 90 tablet 3   Coenzyme Q10 (CO Q-10) 100 MG CAPS      DULoxetine (CYMBALTA) 60 MG capsule Take 1 capsule (60 mg total) by mouth daily. 90 capsule 1   ezetimibe (ZETIA) 10 MG tablet Take 1 tablet (10 mg total) by mouth daily. 90 tablet 3   fluticasone (FLONASE)  50 MCG/ACT nasal spray USE 1 SPRAY IN BOTH NOSTRILS  DAILY 16 mL 6   Immune Globulin, Human, (OCTAGAM IV) Inject 1 Dose into the vein every 21 ( twenty-one) days.     Immune Globulin, Human,-ifas (PANZYGA IV) Inject into the vein.     levothyroxine (SYNTHROID) 150 MCG tablet Take 1 tablet (150 mcg total) by mouth daily. 90 tablet 3   Multiple Vitamin (MULTIVITAMIN) capsule Take 1 capsule by mouth daily.     nitroGLYCERIN (NITROSTAT) 0.4 MG SL tablet Place 1 tablet (0.4 mg total) under the tongue every 5 (five) minutes as needed for chest pain. 30 tablet 5   pantoprazole (PROTONIX) 40 MG tablet Take 1 tablet (40 mg total) by mouth daily. 90 tablet 1   polyethylene glycol powder (GLYCOLAX/MIRALAX) 17 GM/SCOOP powder Take by mouth.     pregabalin (LYRICA) 150 MG capsule Take 1 capsule (150 mg total) by mouth 2 (two) times daily. 180 capsule 0   Semaglutide, 2 MG/DOSE, 8 MG/3ML SOPN Inject 2 mg as directed once a week. 3 mL 5   No current facility-administered medications for this visit.    PAST MEDICAL HISTORY: Past Medical History:  Diagnosis Date   Acute pain of left shoulder 07/20/2021   improved with PT   Allergy 1990   Arthritis 2010   Basal cell carcinoma    Right temple treated with Mohs 06/2016   Basal cell carcinoma    Right neck treated with Mohs 11/2017   Bilateral hip pain 07/20/2021   bursitis - PT helped   Colon polyps    Coronary artery disease    COVID-19    Depression 03/29/2020   Formatting of this note might be different from the original. Buproprion XL 150 mg qd   Diabetes mellitus type 2 in obese    Diverticulitis    sigmoid   Fatty liver    noted on renal ultrasound 12/2021   GERD (gastroesophageal reflux disease)    Glaucoma    Gout    Gross hematuria 12/2021   renal ultrasound normal   Heart disease    Hypercholesteremia    Hypertension    Hypothyroid    Kidney stones 09/20/2016   x2, 1888 and 2070   Myocardial infarction [facility])    Neuromuscular  disorder (HCC) 1995   Neuropathy    Sleep apnea    Dr. Betti Cruz   Stroke [facility]) 2002   TIA   TIA (transient ischemic attack)    Torn meniscus 01/29/2006   Left knee.   Type 2 diabetes mellitus with hyperlipidemia (HCC)     PAST SURGICAL HISTORY: Past Surgical History:  Procedure Laterality Date   CARDIAC CATHETERIZATION  02/16/2012   [facility]   CARPAL TUNNEL RELEASE Bilateral    Left-01/15/2016.  Right-03/11/2016.   COLONOSCOPY W/ POLYPECTOMY  12/20/2016   4 mm sessile tubular adenoma w/multifoci high grade dysplasia @65  cm   CORONARY ANGIOPLASTY WITH STENT PLACEMENT  09/16/2010   MI-cardiac catheterization-1 stent-OM.  [facility]   EYE SURGERY  2017   KNEE  ARTHROSCOPY Left 01/29/2006   Torn meniscus   LESION EXCISION Left 04/01/2009   Benign.  [facility]   MOHS SURGERY      FAMILY HISTORY: Family History  Problem Relation Age of Onset   Depression Mother    Diabetes Mother    Hyperlipidemia Mother    Hypertension Mother    Hypertension Father    Depression Sister    Hyperlipidemia Sister    Hypertension Sister    Alcohol abuse Brother    Diabetes Brother    Heart attack Brother    Heart disease Brother    Hyperlipidemia Brother    Hypertension Brother    Breast cancer Maternal Grandmother    Kidney disease Maternal Grandfather    Stroke Paternal Grandmother    Stroke Paternal Grandfather    Heart disease Brother    Hyperlipidemia Brother    Hypertension Brother     SOCIAL HISTORY: Social History   Socioeconomic History   Marital status: Married    Spouse name: Not on file   Number of children: Not on file   Years of education: Not on file   Highest education level: Master's degree (e.g., MA, MS, MEng, MEd, MSW, MBA)  Occupational History   Not on file  Tobacco Use   Smoking status: Former    Current packs/day: 0.00    Average packs/day: 3.0 packs/day for 5.0 years (15.0 ttl pk-yrs)    Types:  Cigarettes    Start date: 02/11/1975    Quit date: 02/11/1980    Years since quitting: 43.7   Smokeless tobacco: Never  Vaping Use   Vaping status: Never Used  Substance and Sexual Activity   Alcohol use: Yes    Alcohol/week: 2.0 standard drinks of alcohol    Types: 2 Cans of beer per week    Comment: 1-2 drinks a week   Drug use: No   Sexual activity: Not Currently  Other Topics Concern   Not on file  Social History Narrative   Marital status/children/pets: Married.   Education/employment: Masters degree.  Retired from [employer].   Safety:      -smoke alarm in the home:Yes     - wears seatbelt: Yes      Social Drivers of Corporate investment banker Strain: Low Risk  (06/22/2023)   Overall Financial Resource Strain (CARDIA)    Difficulty of Paying Living Expenses: Not hard at all  Food Insecurity: No Food Insecurity (06/22/2023)   Hunger Vital Sign    Worried About Running Out of Food in the Last Year: Never true    Ran Out of Food in the Last Year: Never true  Transportation Needs: No Transportation Needs (06/22/2023)   PRAPARE - Administrator, Civil Service (Medical): No    Lack of Transportation (Non-Medical): No  Physical Activity: Insufficiently Active (06/22/2023)   Exercise Vital Sign    Days of Exercise per Week: 3 days    Minutes of Exercise per Session: 30 min  Stress: No Stress Concern Present (06/22/2023)   [employer] of Occupational Health - Occupational Stress Questionnaire    Feeling of Stress : Only a little  Social Connections: Moderately Integrated (06/22/2023)   Social Connection and Isolation Panel [NHANES]    Frequency of Communication with Friends and Family: More than three times a week    Frequency of Social Gatherings with Friends and Family: Three times a week    Attends Religious Services: Never    Active  Member of Clubs or Organizations: No    Attends Engineer, structural: 1 to 4 times per year     Marital Status: Married  Recent Concern: Social Connections - Moderately Isolated (04/04/2023)   Social Connection and Isolation Panel [NHANES]    Frequency of Communication with Friends and Family: Once a week    Frequency of Social Gatherings with Friends and Family: More than three times a week    Attends Religious Services: Never    Database administrator or Organizations: No    Attends Engineer, structural: Not on file    Marital Status: Married  Catering manager Violence: Not At Risk (06/22/2023)   Humiliation, Afraid, Rape, and Kick questionnaire    Fear of Current or Ex-Partner: No    Emotionally Abused: No    Physically Abused: No    Sexually Abused: No      Levert Feinstein, M.D. Ph.D.  [facility] [address], [address] [place], [place] [address] Ph: [phone] Fax: [phone]  CC:  Natalia Leatherwood, DO [address],  [place] [address]  Claiborne Billings, Renee A, DO

## 2023-10-27 NOTE — Progress Notes (Signed)
EMG report is under procedure tab

## 2023-10-31 ENCOUNTER — Ambulatory Visit: Payer: Medicare HMO | Admitting: Family Medicine

## 2023-10-31 ENCOUNTER — Encounter: Payer: Self-pay | Admitting: Family Medicine

## 2023-10-31 VITALS — BP 138/80 | HR 72 | Temp 97.8°F | Wt 273.4 lb

## 2023-10-31 DIAGNOSIS — Z955 Presence of coronary angioplasty implant and graft: Secondary | ICD-10-CM

## 2023-10-31 DIAGNOSIS — D6869 Other thrombophilia: Secondary | ICD-10-CM

## 2023-10-31 DIAGNOSIS — M1A4710 Other secondary chronic gout, right ankle and foot, without tophus (tophi): Secondary | ICD-10-CM

## 2023-10-31 DIAGNOSIS — I2510 Atherosclerotic heart disease of native coronary artery without angina pectoris: Secondary | ICD-10-CM | POA: Diagnosis not present

## 2023-10-31 DIAGNOSIS — I252 Old myocardial infarction: Secondary | ICD-10-CM

## 2023-10-31 DIAGNOSIS — E559 Vitamin D deficiency, unspecified: Secondary | ICD-10-CM | POA: Diagnosis not present

## 2023-10-31 DIAGNOSIS — G629 Polyneuropathy, unspecified: Secondary | ICD-10-CM

## 2023-10-31 DIAGNOSIS — Z0000 Encounter for general adult medical examination without abnormal findings: Secondary | ICD-10-CM | POA: Diagnosis not present

## 2023-10-31 DIAGNOSIS — E1169 Type 2 diabetes mellitus with other specified complication: Secondary | ICD-10-CM | POA: Diagnosis not present

## 2023-10-31 DIAGNOSIS — K219 Gastro-esophageal reflux disease without esophagitis: Secondary | ICD-10-CM

## 2023-10-31 DIAGNOSIS — Z125 Encounter for screening for malignant neoplasm of prostate: Secondary | ICD-10-CM

## 2023-10-31 DIAGNOSIS — E785 Hyperlipidemia, unspecified: Secondary | ICD-10-CM

## 2023-10-31 DIAGNOSIS — I10 Essential (primary) hypertension: Secondary | ICD-10-CM | POA: Diagnosis not present

## 2023-10-31 DIAGNOSIS — I700 Atherosclerosis of aorta: Secondary | ICD-10-CM

## 2023-10-31 DIAGNOSIS — E039 Hypothyroidism, unspecified: Secondary | ICD-10-CM

## 2023-10-31 LAB — PSA, MEDICARE: PSA: 1.9 ng/mL (ref 0.10–4.00)

## 2023-10-31 LAB — CBC
HCT: 46.4 % (ref 39.0–52.0)
Hemoglobin: 15.1 g/dL (ref 13.0–17.0)
MCHC: 32.5 g/dL (ref 30.0–36.0)
MCV: 93.7 fL (ref 78.0–100.0)
Platelets: 173 10*3/uL (ref 150.0–400.0)
RBC: 4.95 Mil/uL (ref 4.22–5.81)
RDW: 14.6 % (ref 11.5–15.5)
WBC: 7 10*3/uL (ref 4.0–10.5)

## 2023-10-31 LAB — LIPID PANEL
Cholesterol: 115 mg/dL (ref 0–200)
HDL: 38.7 mg/dL — ABNORMAL LOW (ref >39.00)
LDL Cholesterol: 40 mg/dL (ref 0–99)
NonHDL: 75.82
Total CHOL/HDL Ratio: 3
Triglycerides: 177 mg/dL — ABNORMAL HIGH (ref 0.0–149.0)
VLDL: 35.4 mg/dL (ref 0.0–40.0)

## 2023-10-31 LAB — TSH: TSH: 3.78 u[IU]/mL (ref 0.35–5.50)

## 2023-10-31 LAB — HEMOGLOBIN A1C: Hgb A1c MFr Bld: 5.7 % (ref 4.6–6.5)

## 2023-10-31 LAB — VITAMIN D 25 HYDROXY (VIT D DEFICIENCY, FRACTURES): VITD: 29.25 ng/mL — ABNORMAL LOW (ref 30.00–100.00)

## 2023-10-31 MED ORDER — ALLOPURINOL 300 MG PO TABS: 300.0000 mg | ORAL_TABLET | Freq: Every day | ORAL | 1 refills | Status: DC

## 2023-10-31 MED ORDER — DULOXETINE HCL 60 MG PO CPEP: 60.0000 mg | ORAL_CAPSULE | Freq: Every day | ORAL | 1 refills | Status: DC

## 2023-10-31 MED ORDER — PANTOPRAZOLE SODIUM 40 MG PO TBEC: 40.0000 mg | DELAYED_RELEASE_TABLET | Freq: Every day | ORAL | 1 refills | Status: DC

## 2023-10-31 MED ORDER — MOUNJARO 7.5 MG/0.5ML ~~LOC~~ SOAJ: 7.5000 mg | SUBCUTANEOUS | 0 refills | Status: DC

## 2023-10-31 MED ORDER — CANDESARTAN CILEXETIL-HCTZ 16-12.5 MG PO TABS: 1.0000 | ORAL_TABLET | Freq: Every day | ORAL | 1 refills | Status: DC

## 2023-10-31 MED ORDER — CLOPIDOGREL BISULFATE 75 MG PO TABS: 75.0000 mg | ORAL_TABLET | Freq: Every day | ORAL | 3 refills | Status: DC

## 2023-10-31 MED ORDER — PREGABALIN 150 MG PO CAPS: 150.0000 mg | ORAL_CAPSULE | Freq: Two times a day (BID) | ORAL | 1 refills | Status: DC

## 2023-10-31 MED ORDER — ATORVASTATIN CALCIUM 80 MG PO TABS: 80.0000 mg | ORAL_TABLET | Freq: Every day | ORAL | 3 refills | Status: DC

## 2023-10-31 NOTE — Progress Notes (Signed)
Patient ID: Juan Stanton, male  DOB: 22-Jun-1946, 78 y.o.   MRN: [id number] Patient Care Team    Relationship Specialty Notifications Start End  Natalia Leatherwood, DO PCP - General Family Medicine  03/03/21   Jodelle Red, MD PCP - Cardiology Cardiology  07/12/22   Merleen Milliner, MD Referring Physician Cardiology  03/02/21   Bettey Costa, MD Consulting Physician Dermatology  03/02/21   Edwin Cap, DPM Consulting Physician Podiatry  03/02/21   Alanda Slim, MD Consulting Physician Sleep Medicine  03/02/21   Manning Charity, OD Referring Physician Optometry  10/27/22     Chief Complaint  Patient presents with   Medical Management of Chronic Issues    CMC; pt is fasting; CPE    Subjective: Juan Stanton is a 78 y.o.  male present for Chronic Conditions/illness Management Health maintenance:  Colonoscopy: last screen 08/14/2021- 3 yr recall. Dr. Kathie Rhodes. [facility].  Encourage patient to call in August to his GI team if he has not heard from them already Immunizations:  tdap UTD 2024 influenza UTD 2024., PNA series completed, shingrix completed Infectious disease screening: Hep C completed.  PSA: No results found for: PSA, pt was counseled on prostate cancer screenings.  PSA collected today Assistive device: none Oxygen ONG:EXBM Patient has a Dental home. Hospitalizations/ED visits: reviewed  Gastroesophageal reflux disease, unspecified whether esophagitis present Patient reports condition is stable as long as he remains on Protonix 40 mg daily.  Patient is compliant.   Acquired hypothyroidism Patient reports compliance with 150 mg of levothyroxine daily.    Obstructive sleep apnea (adult) (pediatric) Managed by pulmonology.  Spinal stenosis of lumbar region without neurogenic claudication/Peripheral neuropathy, hereditary/idiopathic Patient reports  ccompliance with 150 Lyrica twice daily.  He has continued the B12.  B12 responded well per lab. Pt feels  medication has been very helpful  He has started seeing a neuropathy specialist at the chiropractic office. Prior note He reports he has neuropathy of both his distal lower extremities and distal upper extremities.  He states he has been worked up by neurology and there is an unknown cause of his neuropathy.  He has been prescribed Cymbalta 90 mg daily and Lyrica 100 mg twice daily.  He states these are both very helpful, however he believes his neuropathy has been worsening over the last few months.  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  Patient denies chest pain, shortness of breath, dizziness or lower extremity edema.  He follows w/ cardiology Pt is  prescribed statin.  Patient is on chronic anticoagulation with aspirin/Plavix  Diabetes/obesity Pt reports compliance with Ozempic 2 mg weekly.  He is controlling his A1c well, has not helped with any weight loss for him. Patient denies dizziness, hyperglycemic or hypoglycemic events. Patient denies numbness, tingling in the extremities or nonhealing wounds of feet.  .   BP 138/80   Pulse 72   Temp 97.8 F (36.6 C)   Wt 273 lb 6.4 oz (124 kg)   SpO2 98%   BMI 40.37 kg/m  Physical Exam Vitals and nursing note reviewed.  Constitutional:      General: He is not in acute distress.    Appearance: Normal appearance. He  is obese. He is not ill-appearing, toxic-appearing or diaphoretic.  HENT:     Head: Normocephalic and atraumatic.     Right Ear: Tympanic membrane, ear canal and external ear  normal. There is no impacted cerumen.     Left Ear: Tympanic membrane, ear canal and external ear normal. There is no impacted cerumen.     Nose: Nose normal. No congestion or rhinorrhea.     Mouth/Throat:     Mouth: Mucous membranes are moist.     Pharynx: Oropharynx is clear. No oropharyngeal exudate or posterior oropharyngeal erythema.  Eyes:     General: No scleral icterus.       Right eye: No discharge.        Left eye: No discharge.     Extraocular Movements: Extraocular movements intact.     Pupils: Pupils are equal, round, and reactive to light.  Cardiovascular:     Rate and Rhythm: Normal rate and regular rhythm.     Pulses: Normal pulses.     Heart sounds: Normal heart sounds. No murmur heard.    No friction rub. No gallop.  Pulmonary:     Effort: Pulmonary effort is normal. No respiratory distress.     Breath sounds: Normal breath sounds. No stridor. No wheezing, rhonchi or rales.  Chest:     Chest wall: No tenderness.  Abdominal:     General: Abdomen is flat. Bowel sounds are normal. There is no distension.     Palpations: Abdomen is soft. There is no mass.     Tenderness: There is no abdominal tenderness. There is no right CVA tenderness, left CVA tenderness, guarding or rebound.     Hernia: No hernia is present.  Musculoskeletal:        General: No swelling or tenderness. Normal range of motion.     Cervical back: Normal range of motion and neck supple.     Right lower leg: No edema.     Left lower leg: No edema.  Lymphadenopathy:     Cervical: No cervical adenopathy.  Skin:    General: Skin is warm and dry.     Coloration: Skin is not jaundiced.     Findings: No bruising, lesion or rash.  Neurological:     General: No focal deficit present.     Mental Status: He is alert and oriented to person, place, and time. Mental status is at baseline.     Cranial Nerves: No cranial nerve deficit.     Sensory: No sensory deficit.     Motor: No weakness.      Coordination: Coordination normal.     Gait: Gait normal.     Deep Tendon Reflexes: Reflexes normal.  Psychiatric:        Mood and Affect: Mood normal.        Behavior: Behavior normal.        Thought Content: Thought content normal.        Judgment: Judgment normal.    No results found for this or any previous visit (from the past 48 hours). Diabetic Foot Exam - Simple   Simple Foot Form Diabetic Foot exam was performed with the following findings: Yes 10/31/2023 11:13 AM  Visual Inspection No deformities, no ulcerations, no other skin breakdown bilaterally: Yes Sensation Testing Intact to touch and monofilament testing bilaterally: Yes Pulse Check Posterior Tibialis and Dorsalis pulse intact bilaterally: Yes Comments      Assessment/plan: Juan Stanton is a 78 y.o. male present for CPE and  combined chronic Conditions/illness Management Routine general medical examination at a health care facility (Primary) Patient was encouraged to exercise greater than 150 minutes a week. Patient was encouraged to choose a diet filled with fresh fruits and vegetables, and lean meats. AVS provided to patient today for education/recommendation on gender specific health and safety maintenance. Colonoscopy: last screen 08/14/2021- 3 yr recall. Dr. Kathie Rhodes. [facility].  Encourage patient to call in August to his GI team if he has not heard from them already Immunizations:  tdap UTD 2024 influenza UTD 2024., PNA series completed, shingrix completed Infectious disease screening: Hep C completed.  PSA: No results found for: PSA, pt was counseled on prostate cancer screenings.  PSA collected today  Vitamin D deficiency - Vitamin D (25 hydroxy)  Neuropathy Following neurology  Prostate cancer screening - PSA, Medicare ( [facility] only)   Gastroesophageal reflux disease, unspecified whether esophagitis present/long term PPI use Stable Continue  Protonix 40 mg daily  Acquired  hypothyroidism Continue  levothyroxine 150 micrograms daily.  refills will be provided in appropriate dose based on lab result today Labs TSH collected   Obstructive sleep apnea (adult) (pediatric) Managed by pulmonology  Spinal stenosis of lumbar region without neurogenic claudication/Peripheral neuropathy, hereditary/idiopathic Stable Continue  Cymbalta 60 mg daily Continue  Lyrica to 150 mg twice daily.  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 Continue  Plavix 75 mg daily Continue atorvastatin 80 mg daily. Continue  candesartan/HCTZ 16-12.5 mg daily.  Obesity/diabetes type II in obese Has been stable as far as A1c, however has not shown any weight loss despite full dose of Ozempic.  We discussed switching to [facility], however that is only for 0.4 milligrams weekly to his regimen, and he has not responded thus far and would not expect him to respond much to less than a half a dose higher. We discussed seeing if Greggory Keen is covered and replacing Ozempic and he is agreeable to this today.  Mounjaro 7.5 mg weekly injection prescribed.  If this is approved he will start this 1 week after his last Ozempic dose. We will follow-up closer so that we can maximize the potential with his Mounjaro Continue Ozempic 2 mg weekly. (Left 1 mg dose active in the event he is unable  - Hemoglobin A1c> 5.5 >5.8 >5.7> 6.4>5.5 > A1c collected  today Very tight control of his sugars are recommended given his cardiac conditions. Patient is established with  podiatry Yearly eye exams encouraged-completed 06/2023 Foot exam up-to-date 10/31/2023 Urine microalbumin UTD 03/2023 Follow-up 3 months if Mounjaro covered  Gout: Stable Continue allopurinol 300 mg daily   Return in about 24 weeks (around 04/16/2024) for Routine chronic condition follow-up.  Orders Placed This Encounter  Procedures   CBC   TSH   Lipid panel   Hemoglobin A1c   Vitamin D (25 hydroxy)   PSA, Medicare ( [facility] only)   Meds ordered this encounter  Medications   allopurinol (ZYLOPRIM) 300 MG tablet    Sig: Take 1 tablet (300 mg total) by mouth daily.    Dispense:  90 tablet    Refill:  1    Please send a replace/new response with 90-Day Supply if appropriate to maximize member benefit. Requesting 1  year supply.   atorvastatin (LIPITOR) 80 MG tablet    Sig: Take 1 tablet (80 mg total) by mouth daily.    Dispense:  90 tablet    Refill:  3   candesartan-hydrochlorothiazide (ATACAND HCT) 16-12.5 MG tablet    Sig: Take 1 tablet by mouth daily.    Dispense:  90 tablet    Refill:  1   clopidogrel (PLAVIX) 75 MG tablet    Sig: Take 1 tablet (75 mg total) by mouth daily.    Dispense:  90 tablet    Refill:  3   DULoxetine (CYMBALTA) 60 MG capsule    Sig: Take 1 capsule (60 mg total) by mouth daily.    Dispense:  90 capsule    Refill:  1   pantoprazole (PROTONIX) 40 MG tablet    Sig: Take 1 tablet (40 mg total) by mouth daily.    Dispense:  90 tablet    Refill:  1   pregabalin (LYRICA) 150 MG capsule    Sig: Take 1 capsule (150 mg total) by mouth 2 (two) times daily.    Dispense:  180 capsule    Refill:  1   MOUNJARO 7.5 MG/0.5ML Pen    Sig: Inject 7.5 mg into the skin once a week.    Dispense:  6 mL    Refill:  0    Referral Orders  No referral(s) requested today     Note is dictated utilizing voice recognition software. Although note has been proof read prior to signing, occasional typographical errors still can be missed. If any questions  arise, please do not hesitate to call for verification.  Electronically signed by: Felix Pacini, DO [facility] [place]

## 2023-10-31 NOTE — Patient Instructions (Addendum)

## 2023-11-01 ENCOUNTER — Encounter: Payer: Self-pay | Admitting: Family Medicine

## 2023-11-01 ENCOUNTER — Other Ambulatory Visit: Payer: Self-pay | Admitting: Family Medicine

## 2023-11-01 MED ORDER — LEVOTHYROXINE SODIUM 150 MCG PO TABS: 150.0000 ug | ORAL_TABLET | Freq: Every day | ORAL | 3 refills | Status: DC

## 2023-11-07 ENCOUNTER — Other Ambulatory Visit (HOSPITAL_COMMUNITY): Payer: Self-pay

## 2023-11-07 ENCOUNTER — Telehealth: Payer: Self-pay

## 2023-11-07 NOTE — Telephone Encounter (Signed)
 Pharmacy Patient Advocate Encounter   Received notification from  [facility] Portal that prior authorization for Mounjaro 7.5MG /0.5ML auto-injectors is required/requested.   Insurance verification completed.   The patient is insured through [facility] .   Per test claim: PA required; PA submitted to above mentioned insurance via CoverMyMeds Key/confirmation #/EOC [facility] Status is pending

## 2023-11-08 ENCOUNTER — Other Ambulatory Visit (HOSPITAL_COMMUNITY): Payer: Self-pay

## 2023-11-08 NOTE — Telephone Encounter (Signed)
Pharmacy Patient Advocate Encounter  Received notification from [facility] [employer] that Prior Authorization for Mounjaro 7.5MG /0.5ML auto-injectors has been APPROVED from 11/07/23 to 11/06/24. Unable to obtain price due to refill too soon rejection, last fill date 11/07/23 next available fill date04/14/25   PA #/Case ID/Reference #:  [id number]

## 2023-11-15 ENCOUNTER — Ambulatory Visit: Payer: Medicare HMO | Admitting: Podiatry

## 2023-11-17 ENCOUNTER — Ambulatory Visit: Payer: Medicare HMO | Admitting: Podiatry

## 2023-11-29 ENCOUNTER — Encounter: Payer: Self-pay | Admitting: Podiatry

## 2023-11-29 ENCOUNTER — Ambulatory Visit (INDEPENDENT_AMBULATORY_CARE_PROVIDER_SITE_OTHER): Payer: Medicare HMO | Admitting: Podiatry

## 2023-11-29 DIAGNOSIS — S90221A Contusion of right lesser toe(s) with damage to nail, initial encounter: Secondary | ICD-10-CM | POA: Diagnosis not present

## 2023-11-29 DIAGNOSIS — M79675 Pain in left toe(s): Secondary | ICD-10-CM

## 2023-11-29 DIAGNOSIS — B351 Tinea unguium: Secondary | ICD-10-CM

## 2023-11-29 DIAGNOSIS — M79674 Pain in right toe(s): Secondary | ICD-10-CM | POA: Diagnosis not present

## 2023-11-29 NOTE — Progress Notes (Signed)
  Subjective:  Patient ID: Juan Stanton, male    DOB: April 29, 1946,  MRN: [id number]  Chief Complaint  Patient presents with   Nail Problem    Patient is here for RFC    78 y.o. male returns with the above complaint. History confirmed with patient.  Still doing well, no new issues.   Nails are thick and elongated.     Objective:  Physical Exam: warm, good capillary refill, no trophic changes or ulcerative lesions and normal DP and PT pulses.  Absent light touch sensation and protective sensation distally.  He has nail dystrophy and residual onychomycosis of all nails with thickening dystrophy and subungual debris.  Right fourth toe has subungual hematoma nail plate is well attached  Assessment:   1. Pain due to onychomycosis of toenails of both feet   2. Subungual hematoma of foot, right, initial encounter         Plan:  Patient was evaluated and treated and all questions answered.  Discussed the etiology and treatment options for the condition in detail with the patient.  Prior debridement has been helpful in reducing pain and improving function. Recommended debridement of the nails today. Sharp and mechanical debridement performed of all painful and mycotic nails today. Nails debrided in length and thickness using a nail nipper. Discussed treatment options including appropriate shoe gear. Follow up as needed for painful nails.   Stubbed his toe about 6 weeks ago there is no pain to palpation to the toe itself or evidence of fracture so I did not take x-rays.  Discussed with him that if the bruising does not resolve or if the nail becomes loose or shows any signs of infection that I would recommend avulsion of the nail plate.  He will let me know if he needs this prior to the next visit.   Return in about 3 months (around 02/29/2024) for at risk diabetic foot care.

## 2024-01-08 ENCOUNTER — Other Ambulatory Visit: Payer: Self-pay | Admitting: Family Medicine

## 2024-01-26 ENCOUNTER — Ambulatory Visit (INDEPENDENT_AMBULATORY_CARE_PROVIDER_SITE_OTHER): Admitting: Family Medicine

## 2024-01-26 ENCOUNTER — Other Ambulatory Visit: Payer: Self-pay

## 2024-01-26 ENCOUNTER — Emergency Department (HOSPITAL_BASED_OUTPATIENT_CLINIC_OR_DEPARTMENT_OTHER)

## 2024-01-26 ENCOUNTER — Telehealth: Payer: Self-pay | Admitting: Family Medicine

## 2024-01-26 ENCOUNTER — Encounter (HOSPITAL_BASED_OUTPATIENT_CLINIC_OR_DEPARTMENT_OTHER): Payer: Self-pay

## 2024-01-26 ENCOUNTER — Encounter: Payer: Self-pay | Admitting: Family Medicine

## 2024-01-26 ENCOUNTER — Emergency Department (HOSPITAL_BASED_OUTPATIENT_CLINIC_OR_DEPARTMENT_OTHER)
Admission: EM | Admit: 2024-01-26 | Discharge: 2024-01-26 | Disposition: A | Discharge status: Home / Self Care | Attending: Emergency Medicine | Admitting: Emergency Medicine

## 2024-01-26 VITALS — BP 120/82 | HR 82 | Temp 98.1°F | Wt 259.8 lb

## 2024-01-26 DIAGNOSIS — E119 Type 2 diabetes mellitus without complications: Secondary | ICD-10-CM | POA: Insufficient documentation

## 2024-01-26 DIAGNOSIS — I2510 Atherosclerotic heart disease of native coronary artery without angina pectoris: Secondary | ICD-10-CM | POA: Insufficient documentation

## 2024-01-26 DIAGNOSIS — Z7982 Long term (current) use of aspirin: Secondary | ICD-10-CM | POA: Diagnosis not present

## 2024-01-26 DIAGNOSIS — Z7901 Long term (current) use of anticoagulants: Secondary | ICD-10-CM | POA: Diagnosis not present

## 2024-01-26 DIAGNOSIS — I10 Essential (primary) hypertension: Secondary | ICD-10-CM | POA: Insufficient documentation

## 2024-01-26 DIAGNOSIS — Z79899 Other long term (current) drug therapy: Secondary | ICD-10-CM | POA: Insufficient documentation

## 2024-01-26 DIAGNOSIS — M79605 Pain in left leg: Secondary | ICD-10-CM | POA: Diagnosis not present

## 2024-01-26 DIAGNOSIS — M7989 Other specified soft tissue disorders: Secondary | ICD-10-CM

## 2024-01-26 LAB — CBC WITH DIFFERENTIAL/PLATELET
Basophils Absolute: 0.1 10*3/uL (ref 0.0–0.1)
Basophils Relative: 0.7 % (ref 0.0–3.0)
Eosinophils Absolute: 0.2 10*3/uL (ref 0.0–0.7)
Eosinophils Relative: 2.3 % (ref 0.0–5.0)
HCT: 45.6 % (ref 39.0–52.0)
Hemoglobin: 14.9 g/dL (ref 13.0–17.0)
Lymphocytes Relative: 20.9 % (ref 12.0–46.0)
Lymphs Abs: 1.7 10*3/uL (ref 0.7–4.0)
MCHC: 32.7 g/dL (ref 30.0–36.0)
MCV: 92.3 fl (ref 78.0–100.0)
Monocytes Absolute: 0.6 10*3/uL (ref 0.1–1.0)
Monocytes Relative: 7.7 % (ref 3.0–12.0)
Neutro Abs: 5.4 10*3/uL (ref 1.4–7.7)
Neutrophils Relative %: 68.4 % (ref 43.0–77.0)
Platelets: 185 10*3/uL (ref 150.0–400.0)
RBC: 4.94 Mil/uL (ref 4.22–5.81)
RDW: 14 % (ref 11.5–15.5)
WBC: 7.9 10*3/uL (ref 4.0–10.5)

## 2024-01-26 LAB — URIC ACID: Uric Acid, Serum: 4.1 mg/dL (ref 4.0–7.8)

## 2024-01-26 NOTE — ED Provider Notes (Addendum)
 [place] Provider Note   CSN: [id number] Arrival date & time: 01/26/24  1709     History  Chief Complaint  Patient presents with   abnormal labs    Juan Stanton is a 78 y.o. male.  With past medical history of coronary artery disease, hypertension, gout, obesity, diabetes is presented to emergency room with complaint of left sided leg swelling.  Patient reports that he has had left sided lower extremity swelling into his knee calf and ankle associated with medial calf pain for about 10 days.  He has not noticed any fever or surrounding erythema.  He is able to move his lower extremity without difficulty.  Denies any injury trauma or fall.  He is not on a blood thinner.  He has no history of DVT PE no active history of cancer, recent surgery or prolonged period of immobilization or travel. Denies chest pain, chest tightness or shortness of breath.   HPI     Home Medications Prior to Admission medications   Medication Sig Start Date End Date Taking? Authorizing Provider  allopurinol  (ZYLOPRIM ) 300 MG tablet Take 1 tablet (300 mg total) by mouth daily. 10/31/23   Kuneff, Renee A, DO  aspirin 81 MG chewable tablet Chew by mouth daily.    [provider]  atorvastatin  (LIPITOR) 80 MG tablet Take 1 tablet (80 mg total) by mouth daily. 10/31/23   Kuneff, Renee A, DO  bimatoprost (LUMIGAN) 0.03 % ophthalmic solution 1 drop at bedtime.    [provider]  brimonidine-timolol (COMBIGAN) 0.2-0.5 % ophthalmic solution Place 1 drop into both eyes every 12 (twelve) hours.    [provider]  candesartan -hydrochlorothiazide (ATACAND  HCT) 16-12.5 MG tablet Take 1 tablet by mouth daily. 10/31/23   Kuneff, Renee A, DO  clopidogrel  (PLAVIX ) 75 MG tablet Take 1 tablet (75 mg total) by mouth daily. 10/31/23   Kuneff, Renee A, DO  Coenzyme Q10 (CO Q-10) 100 MG CAPS     [provider]  DULoxetine  (CYMBALTA ) 60 MG capsule Take 1  capsule (60 mg total) by mouth daily. 10/31/23   Kuneff, Renee A, DO  ezetimibe  (ZETIA ) 10 MG tablet Take 1 tablet (10 mg total) by mouth daily. 07/14/23 10/12/23  Sheryle Donning, MD  fluticasone  (FLONASE ) 50 MCG/ACT nasal spray USE 1 SPRAY IN BOTH NOSTRILS  DAILY 05/16/23   Kuneff, Renee A, DO  Immune Globulin, Human,-ifas (PANZYGA IV) Inject into the vein.    [provider]  levothyroxine  (SYNTHROID ) 150 MCG tablet Take 1 tablet (150 mcg total) by mouth daily. 11/01/23   Kuneff, Renee A, DO  MOUNJARO 7.5 MG/0.5ML Pen INJECT THE CONTENTS OF ONE PEN  SUBCUTANEOUSLY WEEKLY AS  DIRECTED 01/09/24   Kuneff, Renee A, DO  Multiple Vitamin (MULTIVITAMIN) capsule Take 1 capsule by mouth daily.    [provider]  nitroGLYCERIN  (NITROSTAT ) 0.4 MG SL tablet Place 1 tablet (0.4 mg total) under the tongue every 5 (five) minutes as needed for chest pain. 07/12/22   Sheryle Donning, MD  pantoprazole  (PROTONIX ) 40 MG tablet Take 1 tablet (40 mg total) by mouth daily. 10/31/23   Kuneff, Renee A, DO  polyethylene glycol powder (GLYCOLAX/MIRALAX) 17 GM/SCOOP powder Take by mouth. 10/03/19   [provider]  pregabalin  (LYRICA ) 150 MG capsule Take 1 capsule (150 mg total) by mouth 2 (two) times daily. 10/31/23   Kuneff, Renee A, DO      Allergies    Celebrex [celecoxib], Iodine, and  Regadenoson    Review of Systems   Review of Systems  Cardiovascular:  Positive for leg swelling.    Physical Exam Updated Vital Signs BP (!) 177/97 (BP Location: Left Wrist)   Pulse 76   Temp 98 F (36.7 C) (Oral)   Resp 18   Ht 5\' 9"  (1.753 m)   Wt 120.2 kg   SpO2 97%   BMI 39.13 kg/m  Physical Exam Vitals and nursing note reviewed.  Constitutional:      General: He is not in acute distress.    Appearance: He is not toxic-appearing.  HENT:     Head: Normocephalic and atraumatic.  Eyes:     General: No scleral icterus.    Conjunctiva/sclera: Conjunctivae normal.  Cardiovascular:      Rate and Rhythm: Normal rate and regular rhythm.     Pulses: Normal pulses.     Heart sounds: Normal heart sounds.  Pulmonary:     Effort: Pulmonary effort is normal. No respiratory distress.     Breath sounds: Normal breath sounds.  Abdominal:     General: Abdomen is flat. Bowel sounds are normal.     Palpations: Abdomen is soft.     Tenderness: There is no abdominal tenderness.  Musculoskeletal:     Right lower leg: No edema.     Left lower leg: Edema present.     Comments: Strong dorsal pedal pulse bilaterally.  Lower extremity with significant left ankle, calf and knee swelling.  Moving extremity without any difficulty.  No erythema or wound.   Skin:    General: Skin is warm and dry.     Findings: No lesion.  Neurological:     General: No focal deficit present.     Mental Status: He is alert and oriented to person, place, and time. Mental status is at baseline.     ED Results / Procedures / Treatments   Labs (all labs ordered are listed, but only abnormal results are displayed) Labs Reviewed - No data to display  EKG None  Radiology No results found.  Procedures Procedures    Medications Ordered in ED Medications - No data to display  ED Course/ Medical Decision Making/ A&P                                 Medical Decision Making  Juan Stanton 78 y.o. presented today for unilateral LE edema. Working Ddx: dependent edema, venous insufficiency, thrombophlebitis, secondary to medications, CHF, edema, AKI, nephrotic syndrome, dvt   R/o DDx: These are considered less likely than current impression due to history of present illness, physical exam, labs/imaging findings.  Review of prior external notes: 01/26/24 OV seen for similar thing, OP US  ordered for tomorrow.   Unique Tests and My Interpretation:  Had recent CBC with diff today, no leukocytosis, no anemia. Uric acid wnl. Recent kidney function normal.  Vascular US  studies negative  Problem List / ED Course  / Critical interventions / Medication management  Patient presented to emergency room with chief complaint of left lower extremity swelling associated with calf tenderness.  Denies any obvious DVT risk factors has no history of DVT and is not on blood thinner.  He is on Plavix .  His exam is not consistent with cellulitis.  He has no foot wound.  Denies any recent injury trauma or fall.  He strong dorsal pedal pulse equal bilaterally and sensation is intact.  Moving extremity  without any difficulty.  He is hemodynamically stable and well-appearing he is not hypoxic denies chest pain or shortness of breath.  Will get ultrasound to rule out DVT and then reassess. No acute DVT. Will have patient use elevation, compression stocking and follow up with primary care.    Referred pain medication however patient declines. Patients vitals assessed. Upon arrival patient is hemodynamically stable.  I have reviewed the patients home medicines and have made adjustments as needed   Plan: Given return precautions.  Stable for discharge.         Final Clinical Impression(s) / ED Diagnoses Final diagnoses:  Left leg swelling    Rx / DC Orders ED Discharge Orders     None         Suzanne Erps, Kandace Organ, PA-C 01/26/24 [id number]    Eudora Heron, PA-C 01/26/24 Suzzanna Estimable, MD 01/27/24 [id number]

## 2024-01-26 NOTE — Telephone Encounter (Signed)
 Spoke with patient regarding results/recommendations.

## 2024-01-26 NOTE — Patient Instructions (Signed)

## 2024-01-26 NOTE — ED Notes (Signed)
 Pt. Reports edema in the L leg below the L knee for a week or longer.  Pt. Saw his Dr. Carolin Chyle with no D Dimer done.  Pt. Was told to come to ED today based on his blood work done yesterday.  Pt. Having no cough or SHOB

## 2024-01-26 NOTE — Progress Notes (Signed)
 Juan Stanton , October 02, 1945, 78 y.o., male MRN: [id number] Patient Care Team    Relationship Specialty Notifications Start End  Mariel Shope, DO PCP - General Family Medicine  03/03/21   Sheryle Donning, MD PCP - Cardiology Cardiology  07/12/22   Rexford Catchings, MD Referring Physician Cardiology  03/02/21   Aleen Huron, MD Consulting Physician Dermatology  03/02/21   Floyce Hutching, DPM Consulting Physician Podiatry  03/02/21   Woodson He, MD Consulting Physician Sleep Medicine  03/02/21   Pauline Bos, OD Referring Physician Optometry  10/27/22     Chief Complaint  Patient presents with   Edema    Left leg 7-10 days ago     Subjective: Juan Stanton is a 78 y.o. Pt presents for an OV with complaints of left-sided leg swelling and calf pain of 10 days duration.  Associated symptoms include decreased range of motion.  Patient denies any injury prior to onset of symptoms.  He states he woke up and noted his leg was swollen and since has noticed posterior calf pain.  He also endorses feeling a tightness in his posterior thigh.  He denies any recent surgery or travel.  He has no prior history of blood clot formation.  There is no family history of blood clots.  He reports is compliant with the aspirin 81 mg daily and Plavix  for his cardiac stent.  He denies missing any doses of his medication.  He denies any fever, chills, palpitations, dizziness or shortness of breath.  He has a significant past medical history of coronary artery disease with angioplasty implant and graft, history of MI, type 2 diabetes, hyperlipidemia, obstructive sleep apnea, peripheral artery disease and history of TIA.  He also has a history of gout.  He is compliant with allopurinol  300 mg daily patient states this is not consistent with his normal gout flares and denies erythema or joint pain swelling     10/31/2023   11:03 AM 06/22/2023   10:04 AM 04/05/2023    9:40 AM 10/19/2022    9:42  AM 09/02/2022   11:03 AM  Depression screen PHQ 2/9  Decreased Interest 2 0 0 1 1  Down, Depressed, Hopeless 1 0 0 1 1  PHQ - 2 Score 3 0 0 2 2  Altered sleeping 1   1   Tired, decreased energy 1   1   Change in appetite 0   0   Feeling bad or failure about yourself  0   0   Trouble concentrating 0   0   Moving slowly or fidgety/restless 0   0   Suicidal thoughts 0   0   PHQ-9 Score 5   4   Difficult doing work/chores Not difficult at all        Allergies  Allergen Reactions   Celebrex [Celecoxib] Swelling   Iodine Nausea Only   Regadenoson Other (See Comments)    Had chest tightness, lightheadedness. This persisted after receiving aminophylline. Came to hospital, was having MI, stented   Social History   Social History Narrative   Marital status/children/pets: Married.   Education/employment: Masters degree.  Retired from [employer].   Safety:      -smoke alarm in the home:Yes     - wears seatbelt: Yes      Past Medical History:  Diagnosis Date   Acute pain of left shoulder 07/20/2021   improved with PT   Allergy 1990  Arthritis 2010   Basal cell carcinoma    Right temple treated with Mohs 06/2016   Basal cell carcinoma    Right neck treated with Mohs 11/2017   Bilateral hip pain 07/20/2021   bursitis - PT helped   Colon polyps    Coronary artery disease    COVID-19    Depression 03/29/2020   Formatting of this note might be different from the original. Buproprion XL 150 mg qd   Diabetes mellitus type 2 in obese    Diverticulitis    sigmoid   Fatty liver    noted on renal ultrasound 12/2021   GERD (gastroesophageal reflux disease)    Glaucoma    Gout    Gross hematuria 12/2021   renal ultrasound normal   Heart disease    Hypercholesteremia    Hypertension    Hypothyroid    Kidney stones 09/20/2016   x2, 1888 and 2070   Myocardial infarction [facility])    Neuromuscular disorder (HCC) 1995   Neuropathy    Sleep apnea    Dr. Kieran Pellet    Stroke [facility]) 2002   TIA   TIA (transient ischemic attack)    Torn meniscus 01/29/2006   Left knee.   Type 2 diabetes mellitus with hyperlipidemia [facility])    Past Surgical History:  Procedure Laterality Date   CARDIAC CATHETERIZATION  02/16/2012   [facility]   CARPAL TUNNEL RELEASE Bilateral    Left-01/15/2016.  Right-03/11/2016.   COLONOSCOPY W/ POLYPECTOMY  12/20/2016   4 mm sessile tubular adenoma w/multifoci high grade dysplasia @65  cm   CORONARY ANGIOPLASTY WITH STENT PLACEMENT  09/16/2010   MI-cardiac catheterization-1 stent-OM.  [facility]   EYE SURGERY  2017   KNEE ARTHROSCOPY Left 01/29/2006   Torn meniscus   LESION EXCISION Left 04/01/2009   Benign.  [facility]   MOHS SURGERY     Family History  Problem Relation Age of Onset   Depression Mother    Diabetes Mother    Hyperlipidemia Mother    Hypertension Mother    Hypertension Father    Depression Sister    Hyperlipidemia Sister    Hypertension Sister    Alcohol abuse Brother    Diabetes Brother    Heart attack Brother    Heart disease Brother    Hyperlipidemia Brother    Hypertension Brother    Breast cancer Maternal Grandmother    Kidney disease Maternal Grandfather    Stroke Paternal Grandmother    Stroke Paternal Grandfather    Heart disease Brother    Hyperlipidemia Brother    Hypertension Brother    Allergies as of 01/26/2024       Reactions   Celebrex [celecoxib] Swelling   Iodine Nausea Only   Regadenoson Other (See Comments)   Had chest tightness, lightheadedness. This persisted after receiving aminophylline. Came to hospital, was having MI, stented        Medication List        Accurate as of Jan 26, 2024  2:26 PM. If you have any questions, ask your nurse or doctor.          STOP taking these medications    Semaglutide  (2 MG/DOSE) 8 MG/3ML Sopn Stopped by: Napolean Backbone       TAKE these medications    allopurinol  300 MG  tablet Commonly known as: ZYLOPRIM  Take 1 tablet (300 mg total) by mouth daily.   aspirin 81 MG chewable tablet Chew by mouth daily.  atorvastatin  80 MG tablet Commonly known as: LIPITOR Take 1 tablet (80 mg total) by mouth daily.   bimatoprost 0.03 % ophthalmic solution Commonly known as: LUMIGAN 1 drop at bedtime.   brimonidine-timolol 0.2-0.5 % ophthalmic solution Commonly known as: COMBIGAN Place 1 drop into both eyes every 12 (twelve) hours.   candesartan -hydrochlorothiazide 16-12.5 MG tablet Commonly known as: ATACAND  HCT Take 1 tablet by mouth daily.   clopidogrel  75 MG tablet Commonly known as: PLAVIX  Take 1 tablet (75 mg total) by mouth daily.   Co Q-10 100 MG Caps   DULoxetine  60 MG capsule Commonly known as: CYMBALTA  Take 1 capsule (60 mg total) by mouth daily.   ezetimibe  10 MG tablet Commonly known as: ZETIA  Take 1 tablet (10 mg total) by mouth daily.   fluticasone  50 MCG/ACT nasal spray Commonly known as: FLONASE  USE 1 SPRAY IN BOTH NOSTRILS  DAILY   levothyroxine  150 MCG tablet Commonly known as: SYNTHROID  Take 1 tablet (150 mcg total) by mouth daily.   Mounjaro 7.5 MG/0.5ML Pen Generic drug: tirzepatide INJECT THE CONTENTS OF ONE PEN  SUBCUTANEOUSLY WEEKLY AS  DIRECTED   multivitamin capsule Take 1 capsule by mouth daily.   nitroGLYCERIN  0.4 MG SL tablet Commonly known as: NITROSTAT  Place 1 tablet (0.4 mg total) under the tongue every 5 (five) minutes as needed for chest pain.   pantoprazole  40 MG tablet Commonly known as: PROTONIX  Take 1 tablet (40 mg total) by mouth daily.   PANZYGA IV Inject into the vein.   polyethylene glycol powder 17 GM/SCOOP powder Commonly known as: GLYCOLAX/MIRALAX Take by mouth.   pregabalin  150 MG capsule Commonly known as: LYRICA  Take 1 capsule (150 mg total) by mouth 2 (two) times daily.        All past medical history, surgical history, allergies, family history, immunizations andmedications  were updated in the EMR today and reviewed under the history and medication portions of their EMR.     Review of Systems  Constitutional:  Negative for chills, diaphoresis, fever and malaise/fatigue.  Respiratory:  Negative for cough, hemoptysis and shortness of breath.   Cardiovascular:  Negative for palpitations.  Skin:  Negative for rash.   Negative, with the exception of above mentioned in HPI   Objective:  BP 120/82   Pulse 82   Temp 98.1 F (36.7 C)   Wt 259 lb 12.8 oz (117.8 kg)   SpO2 97%   BMI 38.37 kg/m  Body mass index is 38.37 kg/m.  Physical Exam Vitals and nursing note reviewed.  Constitutional:      General: He is not in acute distress.    Appearance: Normal appearance. He is not ill-appearing, toxic-appearing or diaphoretic.  HENT:     Head: Normocephalic and atraumatic.  Eyes:     General: No scleral icterus.       Right eye: No discharge.        Left eye: No discharge.     Extraocular Movements: Extraocular movements intact.     Pupils: Pupils are equal, round, and reactive to light.  Cardiovascular:     Rate and Rhythm: Normal rate and regular rhythm.  Pulmonary:     Effort: Pulmonary effort is normal. No respiratory distress.     Breath sounds: Normal breath sounds. No wheezing or rhonchi.  Musculoskeletal:     Right lower leg: Normal.     Left lower leg: Swelling and tenderness present. No deformity, lacerations or bony tenderness. No edema.     Comments: Left  lower extremity: No bruising or erythema.  Tenderness to palpation mid calf.  Significant swelling present from his knee to his foot.  Decreased range of motion secondary to swelling.  Positive Homans.  Neurovascularly intact distally.  Skin:    General: Skin is warm and dry.     Coloration: Skin is not jaundiced or pale.     Findings: No rash.  Neurological:     Mental Status: He is alert and oriented to person, place, and time. Mental status is at baseline.  Psychiatric:        Mood and  Affect: Mood normal.        Behavior: Behavior normal.        Thought Content: Thought content normal.        Judgment: Judgment normal.      No results found. No results found. No results found for this or any previous visit (from the past 24 hours).  Assessment/Plan: Juan Stanton is a 78 y.o. male present for OV for  Left leg swelling (Primary)/pain of left lower extremity Vital signs are stable today.  No complaints of shortness of breath or chest discomfort.   Strong suspicion of DVT of his left calf, possibly extending from thigh area. Patient is on daily baby aspirin/Plavix  therapy for his cardiac graft. Discussed differential diagnosis of DVT, cellulitis or gout.  Exam was not consistent with gout but Stanton obtain a uric acid today to be certain. Stanton also obtain CBC with differential rule out infectious cause. Elected not to complete a D-dimer at this time, that venous ultrasound Stanton be completed today. - US  Venous Img Lower Unilateral Left; Future - CBC w/Diff - Uric acid -Patient understands if image cannot be completed today, for symptoms worsen such as increased pain, swelling, numbness and tingling distally, shortness of breath or chest pain he is to be seen in the emergency room immediately.   Reviewed expectations re: course of current medical issues. Discussed self-management of symptoms. Outlined signs and symptoms indicating need for more acute intervention. Patient verbalized understanding and all questions were answered. Patient received an After-Visit Summary.    Orders Placed This Encounter  Procedures   US  Venous Img Lower Unilateral Left   CBC w/Diff   Uric acid   No orders of the defined types were placed in this encounter.  Referral Orders  No referral(s) requested today     Note is dictated utilizing voice recognition software. Although note has been proof read prior to signing, occasional typographical errors still can be missed. If any  questions arise, please do not hesitate to call for verification.   electronically signed by:  Napolean Backbone, DO  [facility] [place]

## 2024-01-26 NOTE — ED Triage Notes (Signed)
 Sent to ED from PCP for abnormal labs, possible blood clot.  Reports L leg swelling for 10 days. Denies chest pain, SHOB

## 2024-01-26 NOTE — Discharge Instructions (Addendum)
 Your ultrasound is negative for blood clot.  Please continue taking your medications.  I would recommend elevating at this extremity above your heart, wearing compression stockings and trying to avoid high sodium diet.  Please follow up with primary care doctor to go over today's visit. Return to ER with new or worsening symptoms like chest pain, shortness of breath.

## 2024-01-26 NOTE — Telephone Encounter (Signed)
 Please call patient I had ordered a stat venous ultrasound of his left extremity today to rule out DVT. I had asked for this to be scheduled today, unfortunately this does not seem to have been able to be accommodated for him.  Therefore, I do recommend he go to the emergency room to have stat/emergent imaging and workup for likely DVT of his left lower extremity.  There is likely less wait at [facility] emergency room, which is the [facility] health facility off of [address].   If this is a DVT, I have concerns of the clot moving and causing a pulmonary embolism, therefore time is of the essence.  The ED will also be able to initiate immediate anticoagulation for him if there is a clot.

## 2024-01-27 ENCOUNTER — Other Ambulatory Visit

## 2024-01-27 ENCOUNTER — Telehealth: Payer: Self-pay | Admitting: Family Medicine

## 2024-01-27 MED ORDER — CEPHALEXIN 500 MG PO CAPS: 500.0000 mg | ORAL_CAPSULE | Freq: Two times a day (BID) | ORAL | 0 refills | Status: DC

## 2024-01-27 NOTE — Telephone Encounter (Signed)
 LM for pt to return call to discuss.

## 2024-01-27 NOTE — Telephone Encounter (Signed)
 Please call patient I reviewed the ED visit notes.  I am glad to see he did not have pain blood clot. That being said I would like him to start on an antibiotic called Keflex that is every 12 hours presuming this is a cellulitis type of infection.  Keep leg elevated when able.  Please make sure he has not follow-up in 1 week.  Although his blood flow to the lower extremity was great, if swelling continues it can cause a condition called compartment syndrome, which can decrease circulation to the lower extremity. If leg would become numb/tingling/cold or he notices color change such as redness or pain, follow-up immediately in the emergency room.

## 2024-01-31 NOTE — Telephone Encounter (Signed)
 MyChart message read.

## 2024-02-06 ENCOUNTER — Ambulatory Visit (INDEPENDENT_AMBULATORY_CARE_PROVIDER_SITE_OTHER): Admitting: Family Medicine

## 2024-02-06 ENCOUNTER — Encounter: Payer: Self-pay | Admitting: Family Medicine

## 2024-02-06 VITALS — BP 136/84 | HR 78 | Temp 98.2°F | Wt 259.0 lb

## 2024-02-06 DIAGNOSIS — L03116 Cellulitis of left lower limb: Secondary | ICD-10-CM | POA: Diagnosis not present

## 2024-02-06 DIAGNOSIS — M7989 Other specified soft tissue disorders: Secondary | ICD-10-CM

## 2024-02-06 MED ORDER — FUROSEMIDE 20 MG PO TABS: ORAL_TABLET | ORAL | 0 refills | Status: DC

## 2024-02-06 NOTE — Progress Notes (Signed)
 Juan Stanton , [birth year], 78 y.o., male MRN: [id number] Patient Care Team    Relationship Specialty Notifications Start End  Mariel Shope, DO PCP - General Family Medicine  03/03/21   Sheryle Donning, MD PCP - Cardiology Cardiology  07/12/22   Rexford Catchings, MD Referring Physician Cardiology  03/02/21   Aleen Huron, MD Consulting Physician Dermatology  03/02/21   Floyce Hutching, DPM Consulting Physician Podiatry  03/02/21   Woodson He, MD Consulting Physician Sleep Medicine  03/02/21   Pauline Bos, OD Referring Physician Optometry  10/27/22     Chief Complaint  Patient presents with   Leg Swelling    F/U. Pt states swelling has improved.      Subjective: Juan Stanton is a 78 y.o. Pt presents for an OV to follow-up on left lower extremity swelling, redness and pain.  Venous Dopplers were negative for DVT.  Doxycycline  prescribed for presumed cellulitis.  WBCs and uric acid levels were normal.  Today patient states swelling has improved, but some still remains around his ankle.  He has been wearing compression stockings.  He has taken the doxycycline  and only has a few days left.  He denies any pain or erythema.    Prior note: with complaints of left-sided leg swelling and calf pain of 10 days duration.  Associated symptoms include decreased range of motion.  Patient denies any injury prior to onset of symptoms.  He states he woke up and noted his leg was swollen and since has noticed posterior calf pain.  He also endorses feeling a tightness in his posterior thigh.  He denies any recent surgery or travel.  He has no prior history of blood clot formation.  There is no family history of blood clots.  He reports is compliant with the aspirin 81 mg daily and Plavix  for his cardiac stent.  He denies missing any doses of his medication.  He denies any fever, chills, palpitations, dizziness or shortness of breath.  He has a significant past medical history of  coronary artery disease with angioplasty implant and graft, history of MI, type 2 diabetes, hyperlipidemia, obstructive sleep apnea, peripheral artery disease and history of TIA.  He also has a history of gout.  He is compliant with allopurinol  300 mg daily patient states this is not consistent with his normal gout flares and denies erythema or joint pain swelling     10/31/2023   11:03 AM 06/22/2023   10:04 AM 04/05/2023    9:40 AM 10/19/2022    9:42 AM 09/02/2022   11:03 AM  Depression screen PHQ 2/9  Decreased Interest 2 0 0 1 1  Down, Depressed, Hopeless 1 0 0 1 1  PHQ - 2 Score 3 0 0 2 2  Altered sleeping 1   1   Tired, decreased energy 1   1   Change in appetite 0   0   Feeling bad or failure about yourself  0   0   Trouble concentrating 0   0   Moving slowly or fidgety/restless 0   0   Suicidal thoughts 0   0   PHQ-9 Score 5   4   Difficult doing work/chores Not difficult at all        Allergies  Allergen Reactions   Celebrex [Celecoxib] Swelling   Iodine Nausea Only   Regadenoson Other (See Comments)    Had chest tightness, lightheadedness. This persisted after receiving aminophylline. Came  to hospital, was having MI, stented   Social History   Social History Narrative   Marital status/children/pets: Married.   Education/employment: Masters degree.  Retired from [employer].   Safety:      -smoke alarm in the home:Yes     - wears seatbelt: Yes      Past Medical History:  Diagnosis Date   Acute pain of left shoulder 07/20/2021   improved with PT   Allergy 1990   Arthritis 2010   Basal cell carcinoma    Right temple treated with Mohs 06/2016   Basal cell carcinoma    Right neck treated with Mohs 11/2017   Bilateral hip pain 07/20/2021   bursitis - PT helped   Colon polyps    Coronary artery disease    COVID-19    Depression 03/29/2020   Formatting of this note might be different from the original. Buproprion XL 150 mg qd   Diabetes mellitus  type 2 in obese    Diverticulitis    sigmoid   Fatty liver    noted on renal ultrasound 12/2021   GERD (gastroesophageal reflux disease)    Glaucoma    Gout    Gross hematuria 12/2021   renal ultrasound normal   Heart disease    Hypercholesteremia    Hypertension    Hypothyroid    Kidney stones 09/20/2016   x2, 1888 and 2070   Myocardial infarction [facility])    Neuromuscular disorder (HCC) 1995   Neuropathy    Sleep apnea    Dr. Kieran Pellet   Stroke [facility]) 2002   TIA   TIA (transient ischemic attack)    Torn meniscus 01/29/2006   Left knee.   Type 2 diabetes mellitus with hyperlipidemia [facility])    Past Surgical History:  Procedure Laterality Date   CARDIAC CATHETERIZATION  02/16/2012   [facility]   CARPAL TUNNEL RELEASE Bilateral    Left-01/15/2016.  Right-03/11/2016.   COLONOSCOPY W/ POLYPECTOMY  12/20/2016   4 mm sessile tubular adenoma w/multifoci high grade dysplasia @65  cm   CORONARY ANGIOPLASTY WITH STENT PLACEMENT  09/16/2010   MI-cardiac catheterization-1 stent-OM.  [facility]   EYE SURGERY  2017   KNEE ARTHROSCOPY Left 01/29/2006   Torn meniscus   LESION EXCISION Left 04/01/2009   Benign.  [facility]   MOHS SURGERY     Family History  Problem Relation Age of Onset   Depression Mother    Diabetes Mother    Hyperlipidemia Mother    Hypertension Mother    Hypertension Father    Depression Sister    Hyperlipidemia Sister    Hypertension Sister    Alcohol abuse Brother    Diabetes Brother    Heart attack Brother    Heart disease Brother    Hyperlipidemia Brother    Hypertension Brother    Breast cancer Maternal Grandmother    Kidney disease Maternal Grandfather    Stroke Paternal Grandmother    Stroke Paternal Grandfather    Heart disease Brother    Hyperlipidemia Brother    Hypertension Brother    Allergies as of 02/06/2024       Reactions   Celebrex [celecoxib] Swelling   Iodine Nausea Only   Regadenoson  Other (See Comments)   Had chest tightness, lightheadedness. This persisted after receiving aminophylline. Came to hospital, was having MI, stented        Medication List        Accurate as of  Feb 06, 2024 10:13 AM. If you have any questions, ask your nurse or doctor.          allopurinol  300 MG tablet Commonly known as: ZYLOPRIM  Take 1 tablet (300 mg total) by mouth daily.   aspirin 81 MG chewable tablet Chew by mouth daily.   atorvastatin  80 MG tablet Commonly known as: LIPITOR Take 1 tablet (80 mg total) by mouth daily.   bimatoprost 0.03 % ophthalmic solution Commonly known as: LUMIGAN 1 drop at bedtime.   brimonidine-timolol 0.2-0.5 % ophthalmic solution Commonly known as: COMBIGAN Place 1 drop into both eyes every 12 (twelve) hours.   candesartan -hydrochlorothiazide 16-12.5 MG tablet Commonly known as: ATACAND  HCT Take 1 tablet by mouth daily.   cephALEXin  500 MG capsule Commonly known as: KEFLEX  Take 1 capsule (500 mg total) by mouth 2 (two) times daily.   clopidogrel  75 MG tablet Commonly known as: PLAVIX  Take 1 tablet (75 mg total) by mouth daily.   Co Q-10 100 MG Caps   DULoxetine  60 MG capsule Commonly known as: CYMBALTA  Take 1 capsule (60 mg total) by mouth daily.   ezetimibe  10 MG tablet Commonly known as: ZETIA  Take 1 tablet (10 mg total) by mouth daily.   fluticasone  50 MCG/ACT nasal spray Commonly known as: FLONASE  USE 1 SPRAY IN BOTH NOSTRILS  DAILY   levothyroxine  150 MCG tablet Commonly known as: SYNTHROID  Take 1 tablet (150 mcg total) by mouth daily.   Mounjaro 7.5 MG/0.5ML Pen Generic drug: tirzepatide INJECT THE CONTENTS OF ONE PEN  SUBCUTANEOUSLY WEEKLY AS  DIRECTED   multivitamin capsule Take 1 capsule by mouth daily.   nitroGLYCERIN  0.4 MG SL tablet Commonly known as: NITROSTAT  Place 1 tablet (0.4 mg total) under the tongue every 5 (five) minutes as needed for chest pain.   pantoprazole  40 MG tablet Commonly known  as: PROTONIX  Take 1 tablet (40 mg total) by mouth daily.   PANZYGA IV Inject into the vein.   polyethylene glycol powder 17 GM/SCOOP powder Commonly known as: GLYCOLAX/MIRALAX Take by mouth.   pregabalin  150 MG capsule Commonly known as: LYRICA  Take 1 capsule (150 mg total) by mouth 2 (two) times daily.        All past medical history, surgical history, allergies, family history, immunizations andmedications were updated in the EMR today and reviewed under the history and medication portions of their EMR.     ROS Negative, with the exception of above mentioned in HPI   Objective:  BP 136/84   Pulse 78   Temp 98.2 F (36.8 C)   Wt 259 lb (117.5 kg)   SpO2 96%   BMI 38.25 kg/m  Body mass index is 38.25 kg/m. Physical Exam Vitals and nursing note reviewed.  Constitutional:      General: He is not in acute distress.    Appearance: Normal appearance. He is not ill-appearing, toxic-appearing or diaphoretic.  HENT:     Head: Normocephalic and atraumatic.  Eyes:     General: No scleral icterus.       Right eye: No discharge.        Left eye: No discharge.     Extraocular Movements: Extraocular movements intact.     Pupils: Pupils are equal, round, and reactive to light.  Cardiovascular:     Rate and Rhythm: Normal rate and regular rhythm.  Pulmonary:     Effort: Pulmonary effort is normal. No respiratory distress.     Breath sounds: Normal breath sounds. No wheezing or rhonchi.  Musculoskeletal:     Right lower leg: Normal.     Left lower leg: Tenderness present. No deformity, lacerations or bony tenderness. Edema present.     Comments: Left lower extremity: No bruising or erythema.  Mild tenderness to palpation mid calf.  Range of motion has improved.  Edema +1 present lower shin to ankle.  Neurovascular intact distally  Skin:    General: Skin is warm and dry.     Coloration: Skin is not jaundiced or pale.     Findings: No erythema or rash.  Neurological:      Mental Status: He is alert and oriented to person, place, and time. Mental status is at baseline.  Psychiatric:        Mood and Affect: Mood normal.        Behavior: Behavior normal.        Thought Content: Thought content normal.        Judgment: Judgment normal.      No results found. No results found. No results found for this or any previous visit (from the past 24 hours).  Assessment/Plan: Juan Stanton is a 78 y.o. male present for OV for  Left leg swelling (Primary)/pain of left lower extremity/cellulitis DVT ruled out by venous Doppler studies Finish course of doxycycline  Continue compression stockings and elevation Majority of symptoms have resolved or at least improving.  He does have +1 edema today lower shin to ankle. - Lasix 20 mg for 2 days prescribed. As long as symptoms continue to improve, no further follow-up needed.  Patient was encouraged to follow-up if symptoms worsen.  Reviewed expectations re: course of current medical issues. Discussed self-management of symptoms. Outlined signs and symptoms indicating need for more acute intervention. Patient verbalized understanding and all questions were answered. Patient received an After-Visit Summary.    No orders of the defined types were placed in this encounter.  No orders of the defined types were placed in this encounter.  Referral Orders  No referral(s) requested today     Note is dictated utilizing voice recognition software. Although note has been proof read prior to signing, occasional typographical errors still can be missed. If any questions arise, please do not hesitate to call for verification.   electronically signed by:  Napolean Backbone, DO  [facility] [place]

## 2024-03-01 ENCOUNTER — Ambulatory Visit: Admitting: Podiatry

## 2024-03-01 ENCOUNTER — Encounter: Payer: Self-pay | Admitting: Podiatry

## 2024-03-01 VITALS — Ht 69.0 in | Wt 259.0 lb

## 2024-03-01 DIAGNOSIS — M79674 Pain in right toe(s): Secondary | ICD-10-CM | POA: Diagnosis not present

## 2024-03-01 DIAGNOSIS — M79675 Pain in left toe(s): Secondary | ICD-10-CM | POA: Diagnosis not present

## 2024-03-01 DIAGNOSIS — B351 Tinea unguium: Secondary | ICD-10-CM | POA: Diagnosis not present

## 2024-03-04 NOTE — Progress Notes (Signed)
  Subjective:  Patient ID: Juan Stanton, male    DOB: December 16, 1945,  MRN: [id number]  Chief Complaint  Patient presents with   Nail Problem    Pt is here for [facility].    78 y.o. male returns with the above complaint. History confirmed with patient.  Still doing well, no new issues.   Nails are thick and elongated.     Objective:  Physical Exam: warm, good capillary refill, no trophic changes or ulcerative lesions and normal DP and PT pulses.  Absent light touch sensation and protective sensation distally.  He has nail dystrophy and residual onychomycosis of all nails with thickening dystrophy and subungual debris.   Assessment:   1. Pain due to onychomycosis of toenails of both feet          Plan:  Patient was evaluated and treated and all questions answered.  Discussed the etiology and treatment options for the condition in detail with the patient.  Prior debridement has been helpful in reducing pain and improving function. Recommended debridement of the nails today. Sharp and mechanical debridement performed of all painful and mycotic nails today. Nails debrided in length and thickness using a nail nipper.  His diabetes remains well-controlled and is overall at low risk currently       Return in about 3 months (around 06/01/2024) for at risk diabetic foot care.

## 2024-03-09 ENCOUNTER — Other Ambulatory Visit: Payer: Self-pay | Admitting: Family Medicine

## 2024-03-09 NOTE — Telephone Encounter (Unsigned)
 Copied from CRM [id number]. Topic: Clinical - Medication Refill >> Mar 09, 2024  2:54 PM Howard Macho wrote: Medication: furosemide   Has the patient contacted their pharmacy? No (Agent: If no, request that the patient contact the pharmacy for the refill. If patient does not wish to contact the pharmacy document the reason why and proceed with request.) (Agent: If yes, when and what did the pharmacy advise?)  This is the patient's preferred pharmacy:   [id number] - [place], [place] - [address] [place] [address] Phone: [phone] Fax: [phone]  Is this the correct pharmacy for this prescription? Yes If no, delete pharmacy and type the correct one.   Has the prescription been filled recently? Yes  Is the patient out of the medication? Yes  Has the patient been seen for an appointment in the last year OR does the patient have an upcoming appointment? {yes/no:[id number]}  Can we respond through MyChart? Yes  Agent: Please be advised that Rx refills may take up to 3 business days. We ask that you follow-up with your pharmacy.

## 2024-03-10 ENCOUNTER — Emergency Department (HOSPITAL_BASED_OUTPATIENT_CLINIC_OR_DEPARTMENT_OTHER)
Admission: EM | Admit: 2024-03-10 | Discharge: 2024-03-10 | Disposition: A | Discharge status: Home / Self Care | Attending: Emergency Medicine | Admitting: Emergency Medicine

## 2024-03-10 ENCOUNTER — Other Ambulatory Visit (HOSPITAL_BASED_OUTPATIENT_CLINIC_OR_DEPARTMENT_OTHER): Payer: Self-pay

## 2024-03-10 ENCOUNTER — Other Ambulatory Visit: Payer: Self-pay

## 2024-03-10 ENCOUNTER — Emergency Department (HOSPITAL_BASED_OUTPATIENT_CLINIC_OR_DEPARTMENT_OTHER)

## 2024-03-10 ENCOUNTER — Encounter (HOSPITAL_BASED_OUTPATIENT_CLINIC_OR_DEPARTMENT_OTHER): Payer: Self-pay

## 2024-03-10 DIAGNOSIS — M51369 Other intervertebral disc degeneration, lumbar region without mention of lumbar back pain or lower extremity pain: Secondary | ICD-10-CM

## 2024-03-10 DIAGNOSIS — M5136 Other intervertebral disc degeneration, lumbar region with discogenic back pain only: Secondary | ICD-10-CM | POA: Insufficient documentation

## 2024-03-10 DIAGNOSIS — X501XXA Overexertion from prolonged static or awkward postures, initial encounter: Secondary | ICD-10-CM | POA: Insufficient documentation

## 2024-03-10 DIAGNOSIS — M48061 Spinal stenosis, lumbar region without neurogenic claudication: Secondary | ICD-10-CM | POA: Insufficient documentation

## 2024-03-10 DIAGNOSIS — E1142 Type 2 diabetes mellitus with diabetic polyneuropathy: Secondary | ICD-10-CM | POA: Diagnosis not present

## 2024-03-10 DIAGNOSIS — Z794 Long term (current) use of insulin: Secondary | ICD-10-CM | POA: Insufficient documentation

## 2024-03-10 DIAGNOSIS — I2510 Atherosclerotic heart disease of native coronary artery without angina pectoris: Secondary | ICD-10-CM | POA: Insufficient documentation

## 2024-03-10 DIAGNOSIS — M4816 Ankylosing hyperostosis [Forestier], lumbar region: Secondary | ICD-10-CM | POA: Insufficient documentation

## 2024-03-10 DIAGNOSIS — Z7982 Long term (current) use of aspirin: Secondary | ICD-10-CM | POA: Diagnosis not present

## 2024-03-10 DIAGNOSIS — M4810 Ankylosing hyperostosis [Forestier], site unspecified: Secondary | ICD-10-CM

## 2024-03-10 DIAGNOSIS — Z79899 Other long term (current) drug therapy: Secondary | ICD-10-CM | POA: Insufficient documentation

## 2024-03-10 DIAGNOSIS — I10 Essential (primary) hypertension: Secondary | ICD-10-CM | POA: Diagnosis not present

## 2024-03-10 DIAGNOSIS — S3992XA Unspecified injury of lower back, initial encounter: Secondary | ICD-10-CM | POA: Diagnosis present

## 2024-03-10 DIAGNOSIS — S39012A Strain of muscle, fascia and tendon of lower back, initial encounter: Secondary | ICD-10-CM | POA: Diagnosis not present

## 2024-03-10 DIAGNOSIS — R109 Unspecified abdominal pain: Secondary | ICD-10-CM | POA: Diagnosis not present

## 2024-03-10 LAB — URINALYSIS, ROUTINE W REFLEX MICROSCOPIC
Bacteria, UA: NONE SEEN
Bilirubin Urine: NEGATIVE
Glucose, UA: NEGATIVE mg/dL
Hgb urine dipstick: NEGATIVE
Ketones, ur: NEGATIVE mg/dL
Nitrite: NEGATIVE
Protein, ur: NEGATIVE mg/dL
Specific Gravity, Urine: 1.017 (ref 1.005–1.030)
pH: 7 (ref 5.0–8.0)

## 2024-03-10 LAB — BASIC METABOLIC PANEL WITH GFR
Anion gap: 13 (ref 5–15)
BUN: 16 mg/dL (ref 8–23)
CO2: 24 mmol/L (ref 22–32)
Calcium: 9.5 mg/dL (ref 8.9–10.3)
Chloride: 103 mmol/L (ref 98–111)
Creatinine, Ser: 0.81 mg/dL (ref 0.61–1.24)
GFR, Estimated: >60 mL/min (ref >60)
Glucose, Bld: 107 mg/dL — ABNORMAL HIGH (ref 70–99)
Potassium: 4.2 mmol/L (ref 3.5–5.1)
Sodium: 140 mmol/L (ref 135–145)

## 2024-03-10 LAB — CBC
HCT: 48.5 % (ref 39.0–52.0)
Hemoglobin: 16 g/dL (ref 13.0–17.0)
MCH: 29.9 pg (ref 26.0–34.0)
MCHC: 33 g/dL (ref 30.0–36.0)
MCV: 90.5 fL (ref 80.0–100.0)
Platelets: 171 10*3/uL (ref 150–400)
RBC: 5.36 MIL/uL (ref 4.22–5.81)
RDW: 13.3 % (ref 11.5–15.5)
WBC: 8.4 10*3/uL (ref 4.0–10.5)
nRBC: 0 % (ref 0.0–0.2)

## 2024-03-10 MED ORDER — CYCLOBENZAPRINE HCL 5 MG PO TABS: 5.0000 mg | ORAL_TABLET | Freq: Once | ORAL | Status: DC

## 2024-03-10 MED ORDER — LIDOCAINE 4 % EX PTCH
1.0000 | MEDICATED_PATCH | CUTANEOUS | 0 refills | Status: DC
  Filled 2024-03-10 (×2): qty 30, 30d supply, fill #0

## 2024-03-10 MED ORDER — LIDOCAINE 5 % EX PTCH
1.0000 | MEDICATED_PATCH | CUTANEOUS | Status: DC
  Administered 2024-03-10: 1 via TRANSDERMAL
  Filled 2024-03-10: qty 1

## 2024-03-10 MED ORDER — HYDROCODONE-ACETAMINOPHEN 5-325 MG PO TABS
1.0000 | ORAL_TABLET | Freq: Once | ORAL | Status: AC
  Filled 2024-03-10: qty 1

## 2024-03-10 MED ORDER — CYCLOBENZAPRINE HCL 10 MG PO TABS
10.0000 mg | ORAL_TABLET | Freq: Two times a day (BID) | ORAL | 0 refills | Status: DC | PRN
  Filled 2024-03-10: qty 20, 10d supply, fill #0

## 2024-03-10 MED ORDER — HYDROCODONE-ACETAMINOPHEN 5-325 MG PO TABS
1.0000 | ORAL_TABLET | Freq: Four times a day (QID) | ORAL | 0 refills | Status: DC | PRN
  Filled 2024-03-10: qty 10, 2d supply, fill #0

## 2024-03-10 NOTE — ED Triage Notes (Signed)
 Pt POV from home c/o right lower back pain radiating into right hip, states is worse at night, x3 days, atraumatic. Ambulatory w/o assist. Denies numbness/tingling

## 2024-03-10 NOTE — ED Provider Notes (Signed)
 [place] [facility] AT [facility] Provider Note   CSN: [id number] Arrival date & time: 03/10/24  2130     Patient presents with: Back Pain   Juan Stanton is a 78 y.o. male.    Back Pain    78 year old male with medical history significant for CAD, HTN, gout, HLD, neuropathy, diverticulitis, GERD, TIA/CVA, nephrolithiasis, DM2 presenting to the emergency department with a chief complaint of right low back pain/flank pain.  The patient states that he has had roughly 3 days of sharp pain in his right low back.  States that symptoms are worse at night when he lies flat.  He denies any falls or injury to his back.  He denies any recent heavy lifting or workouts that could have contributed to his discomfort.  Pain is worse with twisting as well as bending.  He states the pain radiates to his right hip, denies any radicular pain down his legs.  Denies any numbness or weakness in the lower extremities besides his baseline neuropathy, denies any saddle anesthesia, no urinary or fecal incontinence.  Denies any hematuria, dysuria or frequency.  Prior to Admission medications   Medication Sig Start Date End Date Taking? Authorizing Provider  cyclobenzaprine (FLEXERIL) 10 MG tablet Take 1 tablet (10 mg total) by mouth 2 (two) times daily as needed for muscle spasms. 03/10/24  Yes Rosealee Concha, MD  HYDROcodone-acetaminophen  (NORCO/VICODIN) 5-325 MG tablet Take 1-2 tablets by mouth every 6 (six) hours as needed. 03/10/24  Yes Rosealee Concha, MD  lidocaine  (LIDODERM ) 5 % Place 1 patch onto the skin daily. Remove & Discard patch within 12 hours or as directed by MD 03/10/24  Yes Rosealee Concha, MD  allopurinol  (ZYLOPRIM ) 300 MG tablet Take 1 tablet (300 mg total) by mouth daily. 10/31/23   Kuneff, Renee A, DO  aspirin 81 MG chewable tablet Chew by mouth daily.    [provider]  atorvastatin  (LIPITOR) 80 MG tablet Take 1 tablet (80 mg total) by mouth daily. 10/31/23   Kuneff,  Renee A, DO  bimatoprost (LUMIGAN) 0.03 % ophthalmic solution 1 drop at bedtime.    [provider]  brimonidine-timolol (COMBIGAN) 0.2-0.5 % ophthalmic solution Place 1 drop into both eyes every 12 (twelve) hours.    [provider]  candesartan -hydrochlorothiazide (ATACAND  HCT) 16-12.5 MG tablet Take 1 tablet by mouth daily. 10/31/23   Kuneff, Renee A, DO  cephALEXin  (KEFLEX ) 500 MG capsule Take 1 capsule (500 mg total) by mouth 2 (two) times daily. 01/27/24   Kuneff, Renee A, DO  clopidogrel  (PLAVIX ) 75 MG tablet Take 1 tablet (75 mg total) by mouth daily. 10/31/23   Kuneff, Renee A, DO  Coenzyme Q10 (CO Q-10) 100 MG CAPS     [provider]  DULoxetine  (CYMBALTA ) 60 MG capsule Take 1 capsule (60 mg total) by mouth daily. 10/31/23   Kuneff, Renee A, DO  ezetimibe  (ZETIA ) 10 MG tablet Take 1 tablet (10 mg total) by mouth daily. 07/14/23 10/12/23  Sheryle Donning, MD  fluticasone  (FLONASE ) 50 MCG/ACT nasal spray USE 1 SPRAY IN BOTH NOSTRILS  DAILY 05/16/23   Kuneff, Renee A, DO  furosemide  (LASIX ) 20 MG tablet 1 tab po for 2 days, then as needed for edema 02/06/24   Kuneff, Renee A, DO  Immune Globulin, Human,-ifas (PANZYGA IV) Inject into the vein.    [provider]  levothyroxine  (SYNTHROID ) 150 MCG tablet Take 1 tablet (150 mcg total) by mouth daily. 11/01/23   Kuneff, Renee A, DO  MOUNJARO  7.5 MG/0.5ML Pen INJECT THE CONTENTS OF ONE PEN  SUBCUTANEOUSLY WEEKLY AS  DIRECTED 01/09/24   Kuneff, Renee A, DO  Multiple Vitamin (MULTIVITAMIN) capsule Take 1 capsule by mouth daily.    [provider]  nitroGLYCERIN  (NITROSTAT ) 0.4 MG SL tablet Place 1 tablet (0.4 mg total) under the tongue every 5 (five) minutes as needed for chest pain. 07/12/22   Sheryle Donning, MD  pantoprazole  (PROTONIX ) 40 MG tablet Take 1 tablet (40 mg total) by mouth daily. 10/31/23   Kuneff, Renee A, DO  polyethylene glycol powder (GLYCOLAX/MIRALAX) 17 GM/SCOOP powder Take by mouth.  10/03/19   [provider]  pregabalin  (LYRICA ) 150 MG capsule Take 1 capsule (150 mg total) by mouth 2 (two) times daily. 10/31/23   Kuneff, Renee A, DO    Allergies: Celebrex [celecoxib], Iodine, and Regadenoson    Review of Systems  Musculoskeletal:  Positive for back pain.  All other systems reviewed and are negative.   Updated Vital Signs BP (!) 178/111 (BP Location: Right Arm)   Pulse 69   Temp 97.9 F (36.6 C) (Oral)   Resp 18   Ht 5' 9 (1.753 m)   Wt 113.4 kg   SpO2 97%   BMI 36.92 kg/m   Physical Exam Vitals and nursing note reviewed.  Constitutional:      General: He is not in acute distress.    Appearance: He is well-developed.  HENT:     Head: Normocephalic and atraumatic.   Eyes:     Conjunctiva/sclera: Conjunctivae normal.    Cardiovascular:     Rate and Rhythm: Normal rate and regular rhythm.     Heart sounds: No murmur heard. Pulmonary:     Effort: Pulmonary effort is normal. No respiratory distress.     Breath sounds: Normal breath sounds.  Abdominal:     Palpations: Abdomen is soft.     Tenderness: There is no abdominal tenderness. There is right CVA tenderness. There is no left CVA tenderness.   Musculoskeletal:        General: Tenderness present. No swelling.     Cervical back: Neck supple.     Comments: No midline tenderness to palpation of the midline of the cervical, thoracic or lumbar spine.  Right paraspinal muscular tenderness palpated. Negative straight leg raise test bilaterally   Skin:    General: Skin is warm and dry.     Capillary Refill: Capillary refill takes less than 2 seconds.   Neurological:     Mental Status: He is alert.     Comments: 5 out of 5 strength in the bilateral upper and lower extremities with intact sensation to light touch in all 4 extremities  Psychiatric:        Mood and Affect: Mood normal.     (all labs ordered are listed, but only abnormal results are displayed) Labs Reviewed  BASIC METABOLIC  PANEL WITH GFR - Abnormal; Notable for the following components:      Result Value   Glucose, Bld 107 (*)    All other components within normal limits  URINALYSIS, ROUTINE W REFLEX MICROSCOPIC - Abnormal; Notable for the following components:   Leukocytes,Ua SMALL (*)    All other components within normal limits  CBC    EKG: None  Radiology: CT Renal Stone Study Result Date: 03/10/2024 EXAM: CT ABDOMEN AND PELVIS WITHOUT CONTRAST 03/10/2024 07:46:05 AM TECHNIQUE: CT of the abdomen and pelvis was performed without the administration of intravenous contrast. Multiplanar reformatted images  are provided for review. Automated exposure control, iterative reconstruction, and/or weight based adjustment of the mA/kV was utilized to reduce the radiation dose to as low as reasonably achievable. COMPARISON: CT of the abdomen and pelvis without and with contrast 03/03/2022. CLINICAL HISTORY: Right flank pain for 3 days. Personal history of nephrolithiasis. FINDINGS: LOWER CHEST: No acute abnormality. LIVER: The liver is unremarkable. GALLBLADDER AND BILE DUCTS: Gallbladder is unremarkable. No biliary ductal dilatation. SPLEEN: No acute abnormality. PANCREAS: No acute abnormality. ADRENAL GLANDS: No acute abnormality. KIDNEYS, URETERS AND BLADDER: No nephrolithiasis or urinary tract obstruction is present. GI AND BOWEL: A duodenal diverticulum is present without inflammatory change. Diverticular changes are present in the descending and sigmoid colon. No inflammatory changes are present to suggest diverticulitis. PERITONEUM AND RETROPERITONEUM: No ascites. No free air. VASCULATURE: Atherosclerotic calcifications are present in the aorta and branch vessels without an aneurysm. LYMPH NODES: No lymphadenopathy. REPRODUCTIVE ORGANS: No acute abnormality. BONES AND SOFT TISSUES: Ankylosis is present across the disc space at L4-5. Severe right foraminal stenosis is present at L3-4 and L4-5. Degenerative retrolisthesis  contributes to moderate central canal stenosis at L1-2. Changes of DISH (diffuse idiopathic skeletal hyperostosis) are present throughout the thoracic spine to L1. IMPRESSION: 1. No acute findings in the abdomen or pelvis related to the right flank pain or nephrolithiasis. 2. Multilevel degenerative changes of the lumbar spine described, including severe right foraminal stenosis at L3-4 and L4-5 . Electronically signed by: Audree Leas MD 03/10/2024 08:27 AM EDT RP Workstation: [id number]     Procedures   Medications Ordered in the ED  lidocaine  (LIDODERM ) 5 % 1 patch (1 patch Transdermal Patch Applied 03/10/24 0747)  HYDROcodone-acetaminophen  (NORCO/VICODIN) 5-325 MG per tablet 1 tablet (1 tablet Oral Given 03/10/24 0747)                                   Medical Decision Making Amount and/or Complexity of Data Reviewed Labs: ordered. Radiology: ordered.  Risk Prescription drug management.    78 year old male with medical history significant for CAD, HTN, gout, HLD, neuropathy, diverticulitis, GERD, TIA/CVA, nephrolithiasis, DM2 presenting to the emergency department with a chief complaint of right low back pain/flank pain.  The patient states that he has had roughly 3 days of sharp pain in his right low back.  States that symptoms are worse at night when he lies flat.  He denies any falls or injury to his back.  He denies any recent heavy lifting or workouts that could have contributed to his discomfort.  Pain is worse with twisting as well as bending.  He states the pain radiates to his right hip, denies any radicular pain down his legs.  Denies any numbness or weakness in the lower extremities besides his baseline neuropathy, denies any saddle anesthesia, no urinary or fecal incontinence.  Denies any hematuria, dysuria or frequency.  Medical Decision Making:   Creedence Kunesh is a 78 y.o. male who presented to the ED today with acute lower back pain over the past 72 hours, detailed  above.      On my initial exam, the pt was with an intact neurologic exam, tolerating ambulation with an antalgic gait and p.o. intake without difficulty.  Patient had no midline spinal tenderness.  Patient endorsing complete sensation of the perineum.  Patient without episodes of fecal or urinary incontinence.  Patient has no focal neurologic deficits and reassuring vital signs at this time.  No obvious physical abnormality or injury on exam. Notably, patient denies recent trauma, is afebrile, and denies IVDU.  Reviewed and confirmed nursing documentation for past medical history, family history, social history.    Initial Assessment:   With the patient's presentation of acute back pain in the above setting, most likely diagnosis is musculoskeletal strain. Other diagnoses were considered including (but not limited to) underlying fracture, epidural hematoma, cauda equina syndrome, spinal stenosis, spinal malignancy. These are considered less likely due to history of present illness and physical exam findings.   In particular, lack of fever, substantial history of IV drug use, or substantial neurologic abnormality is less consistent with epidural abscess versus discitis or other spinal infection.   Initial Plan:  Multimodal pain control described and patient informed on safe usage.  Labs to include CBC, BMP, UA Screening evaluation including below radiographic evaluation reviewed and grossly unremarkable at this time. Patient stable for continued outpatient evaluation and management of their musculoskeletal pains.  Patient referred back to primary care provider for continued evaluation and management.   Initial Study Results:   Radiology  CT Renal Stone Study  Final Result      IMPRESSION:  1. No acute findings in the abdomen or pelvis related to the right flank pain  or nephrolithiasis.  2. Multilevel degenerative changes of the lumbar spine described, including  severe right foraminal  stenosis at L3-4 and L4-5 .    Disposition:   CT imaging negative for acute findings, does show multilevel degenerative changes.  Patient with negative straight leg raise test and is neurovascularly intact.  Suspect either degenerative changes in the spine resulting in low back pain versus musculoskeletal strain.  Patient is overall well-appearing and neuro intact, pain improved on repeat assessment.  Based on the above findings, I believe patient is stable for discharge and continued outpatient workup.  Patient and family educated about specific return precautions for given chief complaint and symptoms.  Patient and family educated about follow-up with PCP and neurosurgery.  Patient and family expressed understanding of return precautions and need for follow-up. Patient spoken to regarding all imaging and laboratory results and appropriate follow up for these results. All education provided in verbal and written form and time was allowed for answering of patient questions. Patient discharged.          Emergency Department Medication Summary:   Medications  lidocaine  (LIDODERM ) 5 % 1 patch (1 patch Transdermal Patch Applied 03/10/24 0747)  HYDROcodone-acetaminophen  (NORCO/VICODIN) 5-325 MG per tablet 1 tablet (1 tablet Oral Given 03/10/24 0747)      Final diagnoses:  Strain of lumbar region, initial encounter  DISH (diffuse idiopathic skeletal hyperostosis)  Degeneration of intervertebral disc of lumbar region, unspecified whether pain present  Spinal stenosis of lumbar region, unspecified whether neurogenic claudication present    ED Discharge Orders          Ordered    HYDROcodone-acetaminophen  (NORCO/VICODIN) 5-325 MG tablet  Every 6 hours PRN        03/10/24 0836    cyclobenzaprine (FLEXERIL) 10 MG tablet  2 times daily PRN        03/10/24 0836    Ambulatory referral to Neurosurgery        03/10/24 0836    lidocaine  (LIDODERM ) 5 %  Every 24 hours        03/10/24 0836                Rosealee Concha, MD 03/10/24 [phone]

## 2024-03-10 NOTE — Discharge Instructions (Addendum)
 Your symptoms are consistent with low back sprain/strain.  Additionally, you do have evidence of degenerative disc disease in your lumbar spine.  Referral to neurosurgery has been placed. Take a short course of opiates in addition to OTC lidocaine  patches and flexeril for pain control.  Red flag symptoms that would necessitate return would include worsening severe back pain in the setting of new fall, other trauma, fever, numbness in your groin, numbness or weakness in the bilateral lower extremities. Do not operate heavy machinery while taking opiates or flexeril.   Your CT imaging is as follows: COMPARISON:  CT of the abdomen and pelvis without and with contrast 03/03/2022.    CLINICAL HISTORY:  Right flank pain for 3 days. Personal history of nephrolithiasis.    FINDINGS:    LOWER CHEST:  No acute abnormality.    LIVER:  The liver is unremarkable.    GALLBLADDER AND BILE DUCTS:  Gallbladder is unremarkable. No biliary ductal dilatation.    SPLEEN:  No acute abnormality.    PANCREAS:  No acute abnormality.    ADRENAL GLANDS:  No acute abnormality.    KIDNEYS, URETERS AND BLADDER:  No nephrolithiasis or urinary tract obstruction is present.    GI AND BOWEL:  A duodenal diverticulum is present without inflammatory change. Diverticular  changes are present in the descending and sigmoid colon. No inflammatory  changes are present to suggest diverticulitis.    PERITONEUM AND RETROPERITONEUM:  No ascites. No free air.    VASCULATURE:  Atherosclerotic calcifications are present in the aorta and branch vessels  without an aneurysm.    LYMPH NODES:  No lymphadenopathy.    REPRODUCTIVE ORGANS:  No acute abnormality.    BONES AND SOFT TISSUES:  Ankylosis is present across the disc space at L4-5. Severe right foraminal  stenosis is present at L3-4 and L4-5. Degenerative retrolisthesis contributes  to moderate central canal stenosis at L1-2. Changes of DISH (diffuse  idiopathic  skeletal hyperostosis) are present throughout the thoracic spine to L1.    IMPRESSION:  1. No acute findings in the abdomen or pelvis related to the right flank pain  or nephrolithiasis.  2. Multilevel degenerative changes of the lumbar spine described, including  severe right foraminal stenosis at L3-4 and L4-5 .

## 2024-03-12 ENCOUNTER — Other Ambulatory Visit: Payer: Self-pay

## 2024-03-12 ENCOUNTER — Other Ambulatory Visit: Payer: Self-pay | Admitting: Family Medicine

## 2024-03-12 MED ORDER — FUROSEMIDE 20 MG PO TABS: ORAL_TABLET | ORAL | 0 refills | Status: DC

## 2024-03-12 NOTE — Telephone Encounter (Signed)
 Copied from CRM [id number]. Topic: Clinical - Medication Refill >> Mar 09, 2024  2:54 PM Howard Macho wrote: Medication: furosemide   Has the patient contacted their pharmacy? No (Agent: If no, request that the patient contact the pharmacy for the refill. If patient does not wish to contact the pharmacy document the reason why and proceed with request.) (Agent: If yes, when and what did the pharmacy advise?)  This is the patient's preferred pharmacy:   [id number] - [place], [place] - [id number] [place] [address] Phone: [phone] Fax: [phone]  Is this the correct pharmacy for this prescription? Yes If no, delete pharmacy and type the correct one.   Has the prescription been filled recently? Yes  Is the patient out of the medication? Yes  Has the patient been seen for an appointment in the last year OR does the patient have an upcoming appointment? Yes  Can we respond through MyChart? Yes  Agent: Please be advised that Rx refills may take up to 3 business days. We ask that you follow-up with your pharmacy. >> Mar 12, 2024  3:08 PM Magdalene School wrote: Spence Dux calling from [facility] [place] to check status of refill for furosemide  (LASIX ) 20 MG tablet. I advised her that it may take up to 3 business days for refill to be processed.

## 2024-03-12 NOTE — Telephone Encounter (Signed)
 Refill sent.

## 2024-03-12 NOTE — Telephone Encounter (Signed)
 Copied from CRM [id number]. Topic: Clinical - Medication Refill >> Mar 09, 2024  2:54 PM Howard Macho wrote: Medication: furosemide   Has the patient contacted their pharmacy? No (Agent: If no, request that the patient contact the pharmacy for the refill. If patient does not wish to contact the pharmacy document the reason why and proceed with request.) (Agent: If yes, when and what did the pharmacy advise?)  This is the patient's preferred pharmacy:   [id number] - [place], [place] - [address] [place] [address] Phone: [phone] Fax: [phone]  Is this the correct pharmacy for this prescription? Yes If no, delete pharmacy and type the correct one.   Has the prescription been filled recently? Yes  Is the patient out of the medication? Yes  Has the patient been seen for an appointment in the last year OR does the patient have an upcoming appointment? Yes  Can we respond through MyChart? Yes  Agent: Please be advised that Rx refills may take up to 3 business days. We ask that you follow-up with your pharmacy. >> Mar 12, 2024  3:08 PM Magdalene School wrote: Juan Stanton calling from [facility] [place] to check status of refill for furosemide  (LASIX ) 20 MG tablet. I advised her that it may take up to 3 business days for refill to be processed.

## 2024-03-15 ENCOUNTER — Ambulatory Visit (INDEPENDENT_AMBULATORY_CARE_PROVIDER_SITE_OTHER): Admitting: Family Medicine

## 2024-03-15 ENCOUNTER — Encounter: Payer: Self-pay | Admitting: Family Medicine

## 2024-03-15 VITALS — BP 142/82 | HR 78 | Temp 98.1°F | Wt 250.0 lb

## 2024-03-15 DIAGNOSIS — K5901 Slow transit constipation: Secondary | ICD-10-CM

## 2024-03-15 DIAGNOSIS — R109 Unspecified abdominal pain: Secondary | ICD-10-CM

## 2024-03-15 MED ORDER — SENNA-DOCUSATE SODIUM 8.6-50 MG PO TABS: 2.0000 | ORAL_TABLET | Freq: Every day | ORAL | 1 refills | Status: DC

## 2024-03-15 MED ORDER — LACTULOSE 20 G PO PACK: 20.0000 g | PACK | Freq: Two times a day (BID) | ORAL | 0 refills | Status: AC

## 2024-03-15 MED ORDER — BISACODYL 10 MG RE SUPP: 10.0000 mg | RECTAL | 0 refills | Status: DC | PRN

## 2024-03-15 NOTE — Progress Notes (Signed)
 Juan Stanton , [birth year], 78 y.o., male MRN: [id number] Patient Care Team    Relationship Specialty Notifications Start End  Mariel Shope, DO PCP - General Family Medicine  03/03/21   Sheryle Donning, MD PCP - Cardiology Cardiology  07/12/22   Rexford Catchings, MD Referring Physician Cardiology  03/02/21   Aleen Huron, MD Consulting Physician Dermatology  03/02/21   Floyce Hutching, DPM Consulting Physician Podiatry  03/02/21   Woodson He, MD Consulting Physician Sleep Medicine  03/02/21   Pauline Bos, OD Referring Physician Optometry  10/27/22     Chief Complaint  Patient presents with   Constipation    Pt has not had a BM in the last 8 days; abdominal pain, bloating, lower R sided back pain. Pt has taken Miralax and an OTC stool softener.      Subjective: Juan Stanton is a 78 y.o. Pt presents for an OV with complaints of constipation of 8 days duration.  Associated symptoms include abdominal cramping and bloating. Patient reports he has chronic constipation issues and takes 1 cap of MiraLAX and 200 mg of Colace daily.  This regimen over the years has worked well for him and keeping his bowel habits moving routinely.  However over the last 8 days he has continued his normal daily regimen, without a bowel movement.  He states he had a small amount bowel movement this morning that was soft, but it was a very small bowel movement. He denies fevers, chills, nausea or vomiting.  He denies melena. He is prescribed Mounjaro  with a recent increase in dose. He is tolerating p.o.  He is passing gas. He is able to perform daily activities, and is planning to go into work after his appointment.     10/31/2023   11:03 AM 06/22/2023   10:04 AM 04/05/2023    9:40 AM 10/19/2022    9:42 AM 09/02/2022   11:03 AM  Depression screen PHQ 2/9  Decreased Interest 2 0 0 1 1  Down, Depressed, Hopeless 1 0 0 1 1  PHQ - 2 Score 3 0 0 2 2  Altered sleeping 1   1   Tired, decreased  energy 1   1   Change in appetite 0   0   Feeling bad or failure about yourself  0   0   Trouble concentrating 0   0   Moving slowly or fidgety/restless 0   0   Suicidal thoughts 0   0   PHQ-9 Score 5   4   Difficult doing work/chores Not difficult at all        Allergies  Allergen Reactions   Celebrex [Celecoxib] Swelling   Iodine Nausea Only   Regadenoson Other (See Comments)    Had chest tightness, lightheadedness. This persisted after receiving aminophylline. Came to hospital, was having MI, stented   Social History   Social History Narrative   Marital status/children/pets: Married.   Education/employment: Masters degree.  Retired from [employer].   Safety:      -smoke alarm in the home:Yes     - wears seatbelt: Yes      Past Medical History:  Diagnosis Date   Acute pain of left shoulder 07/20/2021   improved with PT   Allergy 1990   Arthritis 2010   Basal cell carcinoma    Right temple treated with Mohs 06/2016   Basal cell carcinoma    Right neck treated with  Mohs 11/2017   Bilateral hip pain 07/20/2021   bursitis - PT helped   Colon polyps    Coronary artery disease    COVID-19    Depression 03/29/2020   Formatting of this note might be different from the original. Buproprion XL 150 mg qd   Diabetes mellitus type 2 in obese    Diverticulitis    sigmoid   Fatty liver    noted on renal ultrasound 12/2021   GERD (gastroesophageal reflux disease)    Glaucoma    Gout    Gross hematuria 12/2021   renal ultrasound normal   Heart disease    Hypercholesteremia    Hypertension    Hypothyroid    Kidney stones 09/20/2016   x2, 1888 and 2070   Myocardial infarction [facility])    Neuromuscular disorder (HCC) 1995   Neuropathy    Sleep apnea    Dr. Kieran Pellet   Stroke [facility]) 2002   TIA   TIA (transient ischemic attack)    Torn meniscus 01/29/2006   Left knee.   Type 2 diabetes mellitus with hyperlipidemia [facility])    Past Surgical History:   Procedure Laterality Date   CARDIAC CATHETERIZATION  02/16/2012   [facility]   CARPAL TUNNEL RELEASE Bilateral    Left-01/15/2016.  Right-03/11/2016.   COLONOSCOPY W/ POLYPECTOMY  12/20/2016   4 mm sessile tubular adenoma w/multifoci high grade dysplasia @65  cm   CORONARY ANGIOPLASTY WITH STENT PLACEMENT  09/16/2010   MI-cardiac catheterization-1 stent-OM.  [facility]   EYE SURGERY  2017   KNEE ARTHROSCOPY Left 01/29/2006   Torn meniscus   LESION EXCISION Left 04/01/2009   Benign.  [facility]   MOHS SURGERY     Family History  Problem Relation Age of Onset   Depression Mother    Diabetes Mother    Hyperlipidemia Mother    Hypertension Mother    Hypertension Father    Depression Sister    Hyperlipidemia Sister    Hypertension Sister    Alcohol abuse Brother    Diabetes Brother    Heart attack Brother    Heart disease Brother    Hyperlipidemia Brother    Hypertension Brother    Breast cancer Maternal Grandmother    Kidney disease Maternal Grandfather    Stroke Paternal Grandmother    Stroke Paternal Grandfather    Heart disease Brother    Hyperlipidemia Brother    Hypertension Brother    Allergies as of 03/15/2024       Reactions   Celebrex [celecoxib] Swelling   Iodine Nausea Only   Regadenoson Other (See Comments)   Had chest tightness, lightheadedness. This persisted after receiving aminophylline. Came to hospital, was having MI, stented        Medication List        Accurate as of March 15, 2024 11:24 AM. If you have any questions, ask your nurse or doctor.          STOP taking these medications    polyethylene glycol powder 17 GM/SCOOP powder Commonly known as: GLYCOLAX/MIRALAX Stopped by: Napolean Backbone       TAKE these medications    allopurinol  300 MG tablet Commonly known as: ZYLOPRIM  TAKE 1 TABLET BY MOUTH DAILY   aspirin 81 MG chewable tablet Chew by mouth daily.   atorvastatin  80 MG  tablet Commonly known as: LIPITOR Take 1 tablet (80 mg total) by mouth daily.   bimatoprost 0.03 % ophthalmic solution Commonly known  as: LUMIGAN 1 drop at bedtime.   bisacodyl 10 MG suppository Commonly known as: Dulcolax Place 1 suppository (10 mg total) rectally as needed for moderate constipation. Started by: Himmat Enberg   brimonidine-timolol 0.2-0.5 % ophthalmic solution Commonly known as: COMBIGAN Place 1 drop into both eyes every 12 (twelve) hours.   candesartan -hydrochlorothiazide 16-12.5 MG tablet Commonly known as: ATACAND  HCT TAKE 1 TABLET BY MOUTH DAILY   cephALEXin  500 MG capsule Commonly known as: KEFLEX  Take 1 capsule (500 mg total) by mouth 2 (two) times daily.   clopidogrel  75 MG tablet Commonly known as: PLAVIX  Take 1 tablet (75 mg total) by mouth daily.   Co Q-10 100 MG Caps   cyclobenzaprine 10 MG tablet Commonly known as: FLEXERIL Take 1 tablet (10 mg total) by mouth 2 (two) times daily as needed for muscle spasms.   DULoxetine  60 MG capsule Commonly known as: CYMBALTA  Take 1 capsule (60 mg total) by mouth daily.   ezetimibe  10 MG tablet Commonly known as: ZETIA  Take 1 tablet (10 mg total) by mouth daily.   fluticasone  50 MCG/ACT nasal spray Commonly known as: FLONASE  USE 1 SPRAY IN BOTH NOSTRILS  DAILY   furosemide  20 MG tablet Commonly known as: LASIX  1 tab po for 2 days, then as needed for edema   HYDROcodone-acetaminophen  5-325 MG tablet Commonly known as: NORCO/VICODIN Take 1-2 tablets by mouth every 6 (six) hours as needed.   lactulose 20 g packet Commonly known as: CEPHULAC Take 1 packet (20 g total) by mouth 2 (two) times daily. Started by: Andras Grunewald   levothyroxine  150 MCG tablet Commonly known as: SYNTHROID  Take 1 tablet (150 mcg total) by mouth daily.   lidocaine  4 % Place 1 patch onto the skin daily. Remove & Discard patch within 12 hours or as directed by MD   Mounjaro  7.5 MG/0.5ML Pen Generic drug:  tirzepatide  INJECT THE CONTENTS OF ONE PEN  SUBCUTANEOUSLY WEEKLY AS  DIRECTED   multivitamin capsule Take 1 capsule by mouth daily.   nitroGLYCERIN  0.4 MG SL tablet Commonly known as: NITROSTAT  Place 1 tablet (0.4 mg total) under the tongue every 5 (five) minutes as needed for chest pain.   pantoprazole  40 MG tablet Commonly known as: PROTONIX  Take 1 tablet (40 mg total) by mouth daily.   PANZYGA IV Inject into the vein.   pregabalin  150 MG capsule Commonly known as: LYRICA  Take 1 capsule (150 mg total) by mouth 2 (two) times daily.   sennosides-docusate sodium 8.6-50 MG tablet Commonly known as: SENOKOT-S Take 2 tablets by mouth daily. Started by: Napolean Backbone        All past medical history, surgical history, allergies, family history, immunizations andmedications were updated in the EMR today and reviewed under the history and medication portions of their EMR.     ROS Negative, with the exception of above mentioned in HPI   Objective:  BP (!) 142/82   Pulse 78   Temp 98.1 F (36.7 C)   Wt 250 lb (113.4 kg)   SpO2 98%   BMI 36.92 kg/m  Body mass index is 36.92 kg/m.  Physical Exam Vitals and nursing note reviewed. Exam conducted with a chaperone present.  Constitutional:      General: He is not in acute distress.    Appearance: Normal appearance. He is not ill-appearing, toxic-appearing or diaphoretic.  HENT:     Head: Normocephalic and atraumatic.   Eyes:     General: No scleral icterus.  Right eye: No discharge.        Left eye: No discharge.     Extraocular Movements: Extraocular movements intact.     Pupils: Pupils are equal, round, and reactive to light.   Abdominal:     General: There is distension.     Palpations: Abdomen is soft.     Tenderness: There is no abdominal tenderness. There is no right CVA tenderness, left CVA tenderness, guarding or rebound.   Musculoskeletal:     Right lower leg: No edema.     Left lower leg: No edema.    Skin:    General: Skin is warm and dry.     Coloration: Skin is not jaundiced or pale.     Findings: No rash.   Neurological:     Mental Status: He is alert and oriented to person, place, and time. Mental status is at baseline.   Psychiatric:        Mood and Affect: Mood normal.        Behavior: Behavior normal.        Thought Content: Thought content normal.        Judgment: Judgment normal.      No results found. No results found. No results found for this or any previous visit (from the past 24 hours).  Assessment/Plan: Ovidio Steele is a 78 y.o. male present for OV for  Slow transit constipation (Primary)/Abdominal cramping Discussed bowel regimen today.  He has acute on chronic constipation today. We discussed adding 2 tabs of Senokot-as to his daily regimen.  He continue decrease his daily Colace to 1 tab with this regimen. He can continue his MiraLAX 1-2 caps daily, after bowel habits return to his normal. Increase hydration.  Diet over the next 24-48 hours should be clear liquid/soft. Start lactulose 20 mg twice daily after he returns home this evening from work, until constipation is cleared. If needed Dulcolax suppositories prescribed with proper instruction If all of the above fails, he can try an OTC fleets enema We discussed emergent precautions today: If no bowel movement, no passage of gas, increased abdominal pain, fever, nausea or vomiting occur he needs to be seen emergently in the ED.  Patient reports understanding.  Reviewed expectations re: course of current medical issues. Discussed self-management of symptoms. Outlined signs and symptoms indicating need for more acute intervention. Patient verbalized understanding and all questions were answered. Patient received an After-Visit Summary.    No orders of the defined types were placed in this encounter.  Meds ordered this encounter  Medications   sennosides-docusate sodium (SENOKOT-S) 8.6-50 MG  tablet    Sig: Take 2 tablets by mouth daily.    Dispense:  60 tablet    Refill:  1   bisacodyl (DULCOLAX) 10 MG suppository    Sig: Place 1 suppository (10 mg total) rectally as needed for moderate constipation.    Dispense:  12 suppository    Refill:  0   lactulose (CEPHULAC) 20 g packet    Sig: Take 1 packet (20 g total) by mouth 2 (two) times daily.    Dispense:  15 each    Refill:  0   Referral Orders  No referral(s) requested today     Note is dictated utilizing voice recognition software. Although note has been proof read prior to signing, occasional typographical errors still can be missed. If any questions arise, please do not hesitate to call for verification.   electronically signed by:  Napolean Backbone, DO  [facility] [place]

## 2024-03-15 NOTE — Patient Instructions (Signed)

## 2024-03-23 ENCOUNTER — Other Ambulatory Visit: Payer: Self-pay | Admitting: Family Medicine

## 2024-04-02 ENCOUNTER — Ambulatory Visit: Payer: Medicare HMO | Admitting: Family Medicine

## 2024-04-04 ENCOUNTER — Telehealth: Payer: Self-pay | Admitting: Family Medicine

## 2024-04-04 NOTE — Telephone Encounter (Unsigned)
 Copied from CRM [id number]. Topic: Clinical - Medication Refill >> Apr 04, 2024 10:24 AM Ernestene P wrote: Medication: furosemide  (LASIX ) 20 MG tablet  Has the patient contacted their pharmacy? Yes (Agent: If no, request that the patient contact the pharmacy for the refill. If patient does not wish to contact the pharmacy document the reason why and proceed with request.) (Agent: If yes, when and what did the pharmacy advise?)  This is the patient's preferred pharmacy:  [id number] - [place], [place] - [address] [place] [address] Phone: [phone] Fax: [phone]    Is this the correct pharmacy for this prescription? Yes If no, delete pharmacy and type the correct one.   Has the prescription been filled recently? No  Is the patient out of the medication? Yes  Has the patient been seen for an appointment in the last year OR does the patient have an upcoming appointment? Yes  Can we respond through MyChart? Yes  Agent: Please be advised that Rx refills may take up to 3 business days. We ask that you follow-up with your pharmacy.

## 2024-04-08 ENCOUNTER — Other Ambulatory Visit: Payer: Self-pay | Admitting: Family Medicine

## 2024-04-09 ENCOUNTER — Ambulatory Visit: Admitting: Family Medicine

## 2024-04-09 ENCOUNTER — Encounter: Payer: Self-pay | Admitting: Family Medicine

## 2024-04-09 VITALS — BP 138/80 | HR 65 | Temp 98.1°F | Wt 256.4 lb

## 2024-04-09 DIAGNOSIS — E1169 Type 2 diabetes mellitus with other specified complication: Secondary | ICD-10-CM | POA: Diagnosis not present

## 2024-04-09 DIAGNOSIS — Z955 Presence of coronary angioplasty implant and graft: Secondary | ICD-10-CM

## 2024-04-09 DIAGNOSIS — E039 Hypothyroidism, unspecified: Secondary | ICD-10-CM | POA: Diagnosis not present

## 2024-04-09 DIAGNOSIS — Z7985 Long-term (current) use of injectable non-insulin antidiabetic drugs: Secondary | ICD-10-CM

## 2024-04-09 DIAGNOSIS — I700 Atherosclerosis of aorta: Secondary | ICD-10-CM

## 2024-04-09 DIAGNOSIS — I2510 Atherosclerotic heart disease of native coronary artery without angina pectoris: Secondary | ICD-10-CM

## 2024-04-09 DIAGNOSIS — I10 Essential (primary) hypertension: Secondary | ICD-10-CM

## 2024-04-09 DIAGNOSIS — E785 Hyperlipidemia, unspecified: Secondary | ICD-10-CM | POA: Diagnosis not present

## 2024-04-09 DIAGNOSIS — I252 Old myocardial infarction: Secondary | ICD-10-CM

## 2024-04-09 LAB — MICROALBUMIN / CREATININE URINE RATIO
Creatinine,U: 186 mg/dL
Microalb Creat Ratio: 5.3 mg/g (ref 0.0–30.0)
Microalb, Ur: 1 mg/dL (ref 0.0–1.9)

## 2024-04-09 LAB — POCT GLYCOSYLATED HEMOGLOBIN (HGB A1C)
HbA1c POC (<> result, manual entry): 5.4 % (ref 4.0–5.6)
HbA1c, POC (controlled diabetic range): 5.4 % (ref 0.0–7.0)
HbA1c, POC (prediabetic range): 5.4 % — AB (ref 5.7–6.4)
Hemoglobin A1C: 5.4 % (ref 4.0–5.6)

## 2024-04-09 MED ORDER — CANDESARTAN CILEXETIL-HCTZ 32-12.5 MG PO TABS: 1.0000 | ORAL_TABLET | Freq: Every day | ORAL | 1 refills | Status: DC

## 2024-04-09 MED ORDER — TIRZEPATIDE 10 MG/0.5ML ~~LOC~~ SOAJ: 10.0000 mg | SUBCUTANEOUS | 0 refills | Status: DC

## 2024-04-09 MED ORDER — DULOXETINE HCL 60 MG PO CPEP: 60.0000 mg | ORAL_CAPSULE | Freq: Every day | ORAL | 1 refills | Status: DC

## 2024-04-09 MED ORDER — PREGABALIN 150 MG PO CAPS: 150.0000 mg | ORAL_CAPSULE | Freq: Two times a day (BID) | ORAL | 1 refills | Status: DC

## 2024-04-09 MED ORDER — ALLOPURINOL 300 MG PO TABS: 300.0000 mg | ORAL_TABLET | Freq: Every day | ORAL | 0 refills | Status: DC

## 2024-04-09 MED ORDER — MOUNJARO 12.5 MG/0.5ML ~~LOC~~ SOAJ: 12.5000 mg | SUBCUTANEOUS | 0 refills | Status: DC

## 2024-04-09 MED ORDER — PANTOPRAZOLE SODIUM 40 MG PO TBEC: 40.0000 mg | DELAYED_RELEASE_TABLET | Freq: Every day | ORAL | 1 refills | Status: DC

## 2024-04-09 NOTE — Progress Notes (Unsigned)
 Patient ID: Juan Stanton, male  DOB: Apr 07, 1946, 78 y.o.   MRN: [id number] Patient Care Team    Relationship Specialty Notifications Start End  Catherine Charlies LABOR, DO PCP - General Family Medicine  03/03/21   Lonni Slain, MD PCP - Cardiology Cardiology  07/12/22   Launie Alexa Shad, MD Referring Physician Cardiology  03/02/21   Bonney Melanie SAUNDERS, MD Consulting Physician Dermatology  03/02/21   Silva Juliene SAUNDERS, DPM Consulting Physician Podiatry  03/02/21   Jess Devona BIRCH, MD Consulting Physician Sleep Medicine  03/02/21   Robinson Mayo, OD Referring Physician Optometry  10/27/22     Chief Complaint  Patient presents with   Diabetes    Pt is not fasting.     Subjective: Juan Stanton is a 78 y.o.  male present for Chronic Conditions/illness Management Gastroesophageal reflux disease, unspecified whether esophagitis present Patient reports condition is stable as long as he remains on Protonix  40 mg daily.  Patient is compliant  Acquired hypothyroidism Patient reports compliance with 150 mg of levothyroxine  daily.    Obstructive sleep apnea (adult) (pediatric) Managed by pulmonology.  Spinal stenosis of lumbar region without neurogenic claudication/Peripheral neuropathy, hereditary/idiopathic Patient reports compliance with 150 Lyrica  twice daily.  He has continued the B12.  B12 responded well per lab. Pt feels medication has been very helpfull  He has started seeing a neuropathy specialist at the chiropractic office. Prior note He reports he has neuropathy of both his distal lower extremities and distal upper extremities.  He states he has been worked up by neurology and there is an unknown cause of his neuropathy.  He has been prescribed Cymbalta  90 mg daily and Lyrica  100 mg twice daily.  He states these are both very helpful, however he believes his neuropathy has been worsening over the last few months.  Essential (primary) hypertension/Mixed hyperlipidemia/presence of  coronary angioplasty implant and graft/Atherosclerosis of aorta (HCC)/Family history of heart disease/PAD (peripheral artery disease) (HCC)/Morbid obesity (HCC)-BMI 39.0-39.9,adult/Atherosclerosis of native coronary artery of native heart without angina pectoris/Acquired thrombophilia (HCC), chronic anticoag Pt reports compliance with metoprolol  XL (12.5 mg) 25 mg daily, aspirin/Plavix /statin, candesartan -HCTZ 16-12.5 mg daily.  Patient denies chest pain, shortness of breath, dizziness or lower extremity edema.  Pt is  prescribed statin.  Patient is on chronic anticoagulation with aspirin/Plavix   Diabetes/obesity Pt reports compliance with mounjaro  7.5 mg weekly.He is controlling his A1c well. Ozempic  2 mg weekly did not help with his weight loss. He has appreciated weight loss with mounjaro  Patient denies dizziness, hyperglycemic or hypoglycemic events. Patient denies numbness, tingling in the extremities or nonhealing wounds of feet.    BP 138/80   Pulse 65   Temp 98.1 F (36.7 C)   Wt 256 lb 6.4 oz (116.3 kg)   SpO2 97%   BMI 37.86 kg/m  Physical Exam Vitals and nursing note reviewed. Exam conducted with a chaperone present.  Constitutional:      General: He is not in acute distress.    Appearance: Normal appearance. He is not ill-appearing, toxic-appearing or diaphoretic.  HENT:     Head: Normocephalic and atraumatic.  Eyes:     General: No scleral icterus.       Right eye: No discharge.        Left eye: No discharge.     Extraocular Movements: Extraocular movements intact.     Pupils: Pupils are equal, round, and reactive to light.  Cardiovascular:     Rate and Rhythm: Normal rate and  regular rhythm.  Pulmonary:     Effort: Pulmonary effort is normal. No respiratory distress.     Breath sounds: Normal breath sounds. No wheezing, rhonchi or rales.  Musculoskeletal:     Right lower leg: No edema.     Left lower leg: No edema.  Skin:    General: Skin is warm.     Findings:  No rash.  Neurological:     Mental Status: He is alert and oriented to person, place, and time. Mental status is at baseline.  Psychiatric:        Mood and Affect: Mood normal.        Behavior: Behavior normal.        Thought Content: Thought content normal.        Judgment: Judgment normal.    Results for orders placed or performed in visit on 04/09/24 (from the past 48 hours)  POCT HgB A1C     Status: Abnormal   Collection Time: 04/09/24  9:42 AM  Result Value Ref Range   Hemoglobin A1C 5.4 4.0 - 5.6 %   HbA1c POC (<> result, manual entry) 5.4 4.0 - 5.6 %   HbA1c, POC (prediabetic range) 5.4 (A) 5.7 - 6.4 %   HbA1c, POC (controlled diabetic range) 5.4 0.0 - 7.0 %   Diabetic Foot Exam - Simple   No data filed      Assessment/plan: Juan Stanton is a 78 y.o. male present for chronic Conditions/illness Management ***  Vitamin D  deficiency - Vitamin D  (25 hydroxy)  Neuropathy Following neurology  Prostate cancer screening - PSA, Medicare ( [place] only)   Gastroesophageal reflux disease, unspecified whether esophagitis present/long term PPI use Stable Continue  Protonix  40 mg daily  Acquired hypothyroidism Continue  levothyroxine  150 micrograms daily.  refills will be provided in appropriate dose based on lab result today   Obstructive sleep apnea (adult) (pediatric) Managed by pulmonology  Spinal stenosis of lumbar region without neurogenic claudication/Peripheral neuropathy, hereditary/idiopathic Stable Continue  Cymbalta  60 mg daily Continue  Lyrica  to 150 mg twice daily.  [place]  controlled substance database reviewed 04/09/24 -Continue B12 daily  Essential (primary) hypertension/Mixed hyperlipidemia/presence of coronary angioplasty implant and graft/Atherosclerosis of aorta (HCC)/Family history of heart disease/PAD (peripheral artery disease) (HCC)/Morbid obesity (HCC)-BMI 39.0-39.9,adult/Atherosclerosis of native coronary artery of native  heart without angina pectoris/Acquired thrombophilia (HCC), chronic anticoag Stable Continue daily baby aspirin. Continue  Plavix  75 mg daily Continue atorvastatin  80 mg daily. Continue  candesartan /HCTZ 16-12.5 mg daily.  Obesity/diabetes type II in obese Has been stable as far as A1c, however has not shown any weight loss despite full dose of Ozempic .  We discussed switching to Wegovy , however that is only for 0.4 milligrams weekly to his regimen, and he has not responded thus far and would not expect him to respond much to less than a half a dose higher. We discussed seeing if Mounjaro  is covered and replacing Ozempic  and he is agreeable to this today.  Mounjaro  7.5 mg weekly injection prescribed.  If this is approved he will start this 1 week after his last Ozempic  dose. We will follow-up closer so that we can maximize the potential with his Mounjaro  Continue Ozempic  2 mg weekly. (Left 1 mg dose active in the event he is unable  - Hemoglobin A1c> 5.5 >5.8 >5.7> 6.4>5.5 >5.4 A1c collected  today Very tight control of his sugars are recommended given his cardiac conditions. Patient is established with podiatry Yearly eye exams encouraged-completed 06/2023 Foot exam  up-to-date 10/31/2023 Urine microalbumin UTD 03/2023*** Follow-up 5 months if Mounjaro  covered  Gout: Stable Continue allopurinol  300 mg daily   Return in about 5 months (around 08/28/2024) for Routine chronic condition follow-up.  Orders Placed This Encounter  Procedures   Urine Microalbumin w/creat. ratio   POCT HgB A1C   Meds ordered this encounter  Medications   allopurinol  (ZYLOPRIM ) 300 MG tablet    Sig: Take 1 tablet (300 mg total) by mouth daily.    Dispense:  30 tablet    Refill:  0    Do not request 1 yr supply   DULoxetine  (CYMBALTA ) 60 MG capsule    Sig: Take 1 capsule (60 mg total) by mouth daily.    Dispense:  90 capsule    Refill:  1    Do not request 1 yr supply   pantoprazole  (PROTONIX ) 40 MG  tablet    Sig: Take 1 tablet (40 mg total) by mouth daily.    Dispense:  90 tablet    Refill:  1    Do not request 1 yr supply   pregabalin  (LYRICA ) 150 MG capsule    Sig: Take 1 capsule (150 mg total) by mouth 2 (two) times daily.    Dispense:  180 capsule    Refill:  1   candesartan -hydrochlorothiazide (ATACAND  HCT) 32-12.5 MG tablet    Sig: Take 1 tablet by mouth daily.    Dispense:  90 tablet    Refill:  1    Do not request 1 yr supply   tirzepatide  (MOUNJARO ) 10 MG/0.5ML Pen    Sig: Inject 10 mg into the skin once a week.    Dispense:  6 mL    Refill:  0   tirzepatide  (MOUNJARO ) 12.5 MG/0.5ML Pen    Sig: Inject 12.5 mg into the skin once a week.    Dispense:  6 mL    Refill:  0    Referral Orders  No referral(s) requested today     Note is dictated utilizing voice recognition software. Although note has been proof read prior to signing, occasional typographical errors still can be missed. If any questions arise, please do not hesitate to call for verification.  Electronically signed by: Charlies Bellini, DO [facility] [place]

## 2024-04-09 NOTE — Patient Instructions (Signed)
 Return in about 5 months (around 08/28/2024) for Routine chronic condition follow-up.        Great to see you today.  I have refilled the medication(s) we provide.   If labs were collected or images ordered, we will inform you of  results once we have received them and reviewed. We will contact you either by echart message, or telephone call.  Please give ample time to the testing facility, and our office to run,  receive and review results. Please do not call inquiring of results, even if you can see them in your chart. We will contact you as soon as we are able. If it has been over 1 week since the test was completed, and you have not yet heard from us , then please call us .    - echart message- for normal results that have been seen by the patient already.   - telephone call: abnormal results or if patient has not viewed results in their echart.  If a referral to a specialist was entered for you, please call us  in 2 weeks if you have not heard from the specialist office to schedule.

## 2024-04-10 ENCOUNTER — Ambulatory Visit: Payer: Self-pay | Admitting: Family Medicine

## 2024-04-12 ENCOUNTER — Other Ambulatory Visit: Payer: Self-pay | Admitting: Family Medicine

## 2024-04-12 NOTE — Telephone Encounter (Signed)
 03/12/24 10 tabs/0 RF

## 2024-04-12 NOTE — Telephone Encounter (Signed)
 Copied from CRM [id number]. Topic: Clinical - Medication Refill >> Apr 12, 2024 11:53 AM Carlyon D wrote: Medication: Surosemide   Has the patient contacted their pharmacy? Yes (Agent: If no, request that the patient contact the pharmacy for the refill. If patient does not wish to contact the pharmacy document the reason why and proceed with request.) (Agent: If yes, when and what did the pharmacy advise?)  This is the patient's preferred pharmacy:  [id number] - [place],  - [id number] [place] [address] Phone: [phone] Fax: [phone]  Is this the correct pharmacy for this prescription? Yes If no, delete pharmacy and type the correct one.   Has the prescription been filled recently? Yes  Is the patient out of the medication? Yes  Has the patient been seen for an appointment in the last year OR does the patient have an upcoming appointment? Yes  Can we respond through MyChart? Yes  Agent: Please be advised that Rx refills may take up to 3 business days. We ask that you follow-up with your pharmacy.

## 2024-04-17 ENCOUNTER — Encounter: Payer: Self-pay | Admitting: Family Medicine

## 2024-04-18 MED ORDER — CANDESARTAN CILEXETIL-HCTZ 16-12.5 MG PO TABS: 1.0000 | ORAL_TABLET | Freq: Every day | ORAL | 0 refills | Status: DC

## 2024-04-30 ENCOUNTER — Other Ambulatory Visit (HOSPITAL_BASED_OUTPATIENT_CLINIC_OR_DEPARTMENT_OTHER): Payer: Self-pay | Admitting: Cardiology

## 2024-04-30 DIAGNOSIS — E782 Mixed hyperlipidemia: Secondary | ICD-10-CM

## 2024-04-30 DIAGNOSIS — I2510 Atherosclerotic heart disease of native coronary artery without angina pectoris: Secondary | ICD-10-CM

## 2024-05-01 ENCOUNTER — Other Ambulatory Visit: Payer: Self-pay | Admitting: Family Medicine

## 2024-05-22 ENCOUNTER — Other Ambulatory Visit: Payer: Self-pay | Admitting: Family Medicine

## 2024-05-31 ENCOUNTER — Ambulatory Visit: Admitting: Podiatry

## 2024-05-31 VITALS — Ht 69.0 in | Wt 256.4 lb

## 2024-05-31 DIAGNOSIS — B351 Tinea unguium: Secondary | ICD-10-CM

## 2024-05-31 DIAGNOSIS — M79675 Pain in left toe(s): Secondary | ICD-10-CM

## 2024-05-31 DIAGNOSIS — M79674 Pain in right toe(s): Secondary | ICD-10-CM | POA: Diagnosis not present

## 2024-05-31 NOTE — Progress Notes (Signed)
  Subjective:  Patient ID: Juan Stanton, male    DOB: [birth year],  MRN: [id number]  Chief Complaint  Patient presents with   Diabetes    Rm 9 Patient is here for diabetic foot care and nail trimmming.    78 y.o. male returns with the above complaint. History confirmed with patient.  Still doing well, no new issues.   Nails are thick and elongated.     Objective:  Physical Exam: warm, good capillary refill, no trophic changes or ulcerative lesions and normal DP and PT pulses.  Absent light touch sensation and protective sensation distally.  He has nail dystrophy and residual onychomycosis of all nails with thickening dystrophy and subungual debris.   Assessment:   1. Pain due to onychomycosis of toenails of both feet          Plan:  Patient was evaluated and treated and all questions answered.  Discussed the etiology and treatment options for the condition in detail with the patient.  Prior debridement has been helpful in reducing pain and improving function. Recommended debridement of the nails today. Sharp and mechanical debridement performed of all painful and mycotic nails today. Nails debrided in length and thickness using a nail nipper.  His diabetes remains well-controlled and is overall at low risk currently       Return in about 4 months (around 09/30/2024) for at risk diabetic foot care.

## 2024-06-25 ENCOUNTER — Other Ambulatory Visit (HOSPITAL_BASED_OUTPATIENT_CLINIC_OR_DEPARTMENT_OTHER): Payer: Self-pay | Admitting: Cardiology

## 2024-06-25 DIAGNOSIS — E782 Mixed hyperlipidemia: Secondary | ICD-10-CM

## 2024-06-25 DIAGNOSIS — I2510 Atherosclerotic heart disease of native coronary artery without angina pectoris: Secondary | ICD-10-CM

## 2024-06-27 ENCOUNTER — Ambulatory Visit: Payer: Medicare HMO | Admitting: *Deleted

## 2024-06-27 VITALS — Ht 69.0 in | Wt 256.0 lb

## 2024-06-27 DIAGNOSIS — Z0000 Encounter for general adult medical examination without abnormal findings: Secondary | ICD-10-CM | POA: Diagnosis not present

## 2024-06-27 NOTE — Patient Instructions (Signed)
 Juan Stanton , Thank you for taking time to come for your Medicare Wellness Visit. I appreciate your ongoing commitment to your health goals. Please review the following plan we discussed and let me know if I can assist you in the future.   Screening recommendations/referrals: Colonoscopy: due   Recommended yearly ophthalmology/optometry visit for glaucoma screening and checkup Recommended yearly dental visit for hygiene and checkup  Vaccinations: Influenza vaccine: up to date Pneumococcal vaccine: Education provided Tdap vaccine: up to date Shingles vaccine: up to date       Preventive Care 65 Years and Older, Male Preventive care refers to lifestyle choices and visits with your health care provider that can promote health and wellness. What does preventive care include? A yearly physical exam. This is also called an annual well check. Dental exams once or twice a year. Routine eye exams. Ask your health care provider how often you should have your eyes checked. Personal lifestyle choices, including: Daily care of your teeth and gums. Regular physical activity. Eating a healthy diet. Avoiding tobacco and drug use. Limiting alcohol use. Practicing safe sex. Taking low doses of aspirin every day. Taking vitamin and mineral supplements as recommended by your health care provider. What happens during an annual well check? The services and screenings done by your health care provider during your annual well check will depend on your age, overall health, lifestyle risk factors, and family history of disease. Counseling  Your health care provider may ask you questions about your: Alcohol use. Tobacco use. Drug use. Emotional well-being. Home and relationship well-being. Sexual activity. Eating habits. History of falls. Memory and ability to understand (cognition). Work and work Astronomer. Screening  You may have the following tests or measurements: Height, weight, and  BMI. Blood pressure. Lipid and cholesterol levels. These may be checked every 5 years, or more frequently if you are over 40 years old. Skin check. Lung cancer screening. You may have this screening every year starting at age 9 if you have a 30-pack-year history of smoking and currently smoke or have quit within the past 15 years. Fecal occult blood test (FOBT) of the stool. You may have this test every year starting at age 22. Flexible sigmoidoscopy or colonoscopy. You may have a sigmoidoscopy every 5 years or a colonoscopy every 10 years starting at age 3. Prostate cancer screening. Recommendations will vary depending on your family history and other risks. Hepatitis C blood test. Hepatitis B blood test. Sexually transmitted disease (STD) testing. Diabetes screening. This is done by checking your blood sugar (glucose) after you have not eaten for a while (fasting). You may have this done every 1-3 years. Abdominal aortic aneurysm (AAA) screening. You may need this if you are a current or former smoker. Osteoporosis. You may be screened starting at age 73 if you are at high risk. Talk with your health care provider about your test results, treatment options, and if necessary, the need for more tests. Vaccines  Your health care provider may recommend certain vaccines, such as: Influenza vaccine. This is recommended every year. Tetanus, diphtheria, and acellular pertussis (Tdap, Td) vaccine. You may need a Td booster every 10 years. Zoster vaccine. You may need this after age 68. Pneumococcal 13-valent conjugate (PCV13) vaccine. One dose is recommended after age 31. Pneumococcal polysaccharide (PPSV23) vaccine. One dose is recommended after age 65. Talk to your health care provider about which screenings and vaccines you need and how often you need them. This information is not intended  to replace advice given to you by your health care provider. Make sure you discuss any questions you have  with your health care provider. Document Released: 10/10/2015 Document Revised: 06/02/2016 Document Reviewed: 07/15/2015 Elsevier Interactive Patient Education  2017 [employer].  Fall Prevention in the Home Falls can cause injuries. They can happen to people of all ages. There are many things you can do to make your home safe and to help prevent falls. What can I do on the outside of my home? Regularly fix the edges of walkways and driveways and fix any cracks. Remove anything that might make you trip as you walk through a door, such as a raised step or threshold. Trim any bushes or trees on the path to your home. Use bright outdoor lighting. Clear any walking paths of anything that might make someone trip, such as rocks or tools. Regularly check to see if handrails are loose or broken. Make sure that both sides of any steps have handrails. Any raised decks and porches should have guardrails on the edges. Have any leaves, snow, or ice cleared regularly. Use sand or salt on walking paths during winter. Clean up any spills in your garage right away. This includes oil or grease spills. What can I do in the bathroom? Use night lights. Install grab bars by the toilet and in the tub and shower. Do not use towel bars as grab bars. Use non-skid mats or decals in the tub or shower. If you need to sit down in the shower, use a plastic, non-slip stool. Keep the floor dry. Clean up any water that spills on the floor as soon as it happens. Remove soap buildup in the tub or shower regularly. Attach bath mats securely with double-sided non-slip rug tape. Do not have throw rugs and other things on the floor that can make you trip. What can I do in the bedroom? Use night lights. Make sure that you have a light by your bed that is easy to reach. Do not use any sheets or blankets that are too big for your bed. They should not hang down onto the floor. Have a firm chair that has side arms. You can use  this for support while you get dressed. Do not have throw rugs and other things on the floor that can make you trip. What can I do in the kitchen? Clean up any spills right away. Avoid walking on wet floors. Keep items that you use a lot in easy-to-reach places. If you need to reach something above you, use a strong step stool that has a grab bar. Keep electrical cords out of the way. Do not use floor polish or wax that makes floors slippery. If you must use wax, use non-skid floor wax. Do not have throw rugs and other things on the floor that can make you trip. What can I do with my stairs? Do not leave any items on the stairs. Make sure that there are handrails on both sides of the stairs and use them. Fix handrails that are broken or loose. Make sure that handrails are as long as the stairways. Check any carpeting to make sure that it is firmly attached to the stairs. Fix any carpet that is loose or worn. Avoid having throw rugs at the top or bottom of the stairs. If you do have throw rugs, attach them to the floor with carpet tape. Make sure that you have a light switch at the top of the stairs and  the bottom of the stairs. If you do not have them, ask someone to add them for you. What else can I do to help prevent falls? Wear shoes that: Do not have high heels. Have rubber bottoms. Are comfortable and fit you well. Are closed at the toe. Do not wear sandals. If you use a stepladder: Make sure that it is fully opened. Do not climb a closed stepladder. Make sure that both sides of the stepladder are locked into place. Ask someone to hold it for you, if possible. Clearly mark and make sure that you can see: Any grab bars or handrails. First and last steps. Where the edge of each step is. Use tools that help you move around (mobility aids) if they are needed. These include: Canes. Walkers. Scooters. Crutches. Turn on the lights when you go into a dark area. Replace any light bulbs  as soon as they burn out. Set up your furniture so you have a clear path. Avoid moving your furniture around. If any of your floors are uneven, fix them. If there are any pets around you, be aware of where they are. Review your medicines with your doctor. Some medicines can make you feel dizzy. This can increase your chance of falling. Ask your doctor what other things that you can do to help prevent falls. This information is not intended to replace advice given to you by your health care provider. Make sure you discuss any questions you have with your health care provider. Document Released: 07/10/2009 Document Revised: 02/19/2016 Document Reviewed: 10/18/2014 Elsevier Interactive Patient Education  2017 [employer].

## 2024-06-27 NOTE — Progress Notes (Signed)
 Subjective:   Juan Stanton is a 78 y.o. male who presents for Medicare Annual/Subsequent preventive examination.  Visit Complete: Virtual I connected with  Juan Stanton on 06/27/24 by a audio enabled telemedicine application and verified that I am speaking with the correct person using two identifiers.  Patient Location: Home  Provider Location: Home Office  I discussed the limitations of evaluation and management by telemedicine. The patient expressed understanding and agreed to proceed.  Vital Signs: Because this visit was a virtual/telehealth visit, some criteria may be missing or patient reported. Any vitals not documented were not able to be obtained and vitals that have been documented are patient reported.  Cardiac Risk Factors include: advanced age (>3men, >50 women);hypertension;male gender;obesity (BMI >30kg/m2)     Objective:    Today's Vitals   06/27/24 1008  Weight: 256 lb (116.1 kg)  Height: 5' 9 (1.753 m)   Body mass index is 37.8 kg/m.     06/27/2024   10:06 AM 03/10/2024    7:14 AM 01/26/2024    5:21 PM 06/22/2023   10:05 AM 10/02/2022    9:32 AM 06/09/2022    9:51 AM 06/03/2021   10:50 AM  Advanced Directives  Does Patient Have a Medical Advance Directive? Yes No No No No Yes No  Type of Therapist, art of [place];Living will   Copy of Healthcare Power of Attorney in Chart? No - copy requested     No - copy requested   Would patient like information on creating a medical advance directive?    Yes (MAU/Ambulatory/Procedural Areas - Information given)   No - Patient declined    Current Medications (verified) Outpatient Encounter Medications as of 06/27/2024  Medication Sig   allopurinol  (ZYLOPRIM ) 300 MG tablet TAKE 1 TABLET BY MOUTH DAILY   aspirin 81 MG chewable tablet Chew by mouth daily.   atorvastatin  (LIPITOR) 80 MG tablet Take 1 tablet (80 mg total) by mouth daily.   bimatoprost (LUMIGAN) 0.03 %  ophthalmic solution 1 drop at bedtime.   bisacodyl  (DULCOLAX) 10 MG suppository Place 1 suppository (10 mg total) rectally as needed for moderate constipation.   brimonidine-timolol (COMBIGAN) 0.2-0.5 % ophthalmic solution Place 1 drop into both eyes every 12 (twelve) hours.   candesartan -hydrochlorothiazide (ATACAND  HCT) 16-12.5 MG tablet TAKE 1 TABLET BY MOUTH DAILY   clopidogrel  (PLAVIX ) 75 MG tablet Take 1 tablet (75 mg total) by mouth daily.   Coenzyme Q10 (CO Q-10) 100 MG CAPS    cyclobenzaprine  (FLEXERIL ) 10 MG tablet Take 1 tablet (10 mg total) by mouth 2 (two) times daily as needed for muscle spasms.   DULoxetine  (CYMBALTA ) 60 MG capsule Take 1 capsule (60 mg total) by mouth daily.   ezetimibe  (ZETIA ) 10 MG tablet TAKE 1 TABLET BY MOUTH DAILY   fluticasone  (FLONASE ) 50 MCG/ACT nasal spray USE 1 SPRAY IN BOTH NOSTRILS  DAILY   furosemide  (LASIX ) 20 MG tablet 1 tab po for 2 days, then as needed for edema   HYDROcodone -acetaminophen  (NORCO/VICODIN) 5-325 MG tablet Take 1-2 tablets by mouth every 6 (six) hours as needed.   Immune Globulin, Human,-ifas (PANZYGA IV) Inject into the vein.   lactulose  (CEPHULAC ) 20 g packet Take 1 packet (20 g total) by mouth 2 (two) times daily.   levothyroxine  (SYNTHROID ) 150 MCG tablet Take 1 tablet (150 mcg total) by mouth daily.   lidocaine  4 % Place 1 patch onto the skin daily. Remove & Discard  patch within 12 hours or as directed by MD   MOUNJARO  7.5 MG/0.5ML Pen INJECT THE CONTENTS OF ONE PEN  SUBCUTANEOUSLY WEEKLY AS  DIRECTED   Multiple Vitamin (MULTIVITAMIN) capsule Take 1 capsule by mouth daily.   nitroGLYCERIN  (NITROSTAT ) 0.4 MG SL tablet Place 1 tablet (0.4 mg total) under the tongue every 5 (five) minutes as needed for chest pain.   pantoprazole  (PROTONIX ) 40 MG tablet Take 1 tablet (40 mg total) by mouth daily.   pregabalin  (LYRICA ) 150 MG capsule Take 1 capsule (150 mg total) by mouth 2 (two) times daily.   sennosides-docusate sodium   (SENOKOT-S) 8.6-50 MG tablet Take 2 tablets by mouth daily.   tirzepatide  (MOUNJARO ) 10 MG/0.5ML Pen Inject 10 mg into the skin once a week.   tirzepatide  (MOUNJARO ) 12.5 MG/0.5ML Pen Inject 12.5 mg into the skin once a week.   No facility-administered encounter medications on file as of 06/27/2024.    Allergies (verified) Celebrex [celecoxib], Iodine, and Regadenoson   History: Past Medical History:  Diagnosis Date   Acute pain of left shoulder 07/20/2021   improved with PT   Allergy 1990   Arthritis 2010   Basal cell carcinoma    Right temple treated with Mohs 06/2016   Basal cell carcinoma    Right neck treated with Mohs 11/2017   Bilateral hip pain 07/20/2021   bursitis - PT helped   Colon polyps    Coronary artery disease    COVID-19    Depression 03/29/2020   Formatting of this note might be different from the original. Buproprion XL 150 mg qd   Diabetes mellitus type 2 in obese    Diverticulitis    sigmoid   Fatty liver    noted on renal ultrasound 12/2021   GERD (gastroesophageal reflux disease)    Glaucoma    Gout    Gross hematuria 12/2021   renal ultrasound normal   Heart disease    Hypercholesteremia    Hypertension    Hypothyroid    Kidney stones 09/20/2016   x2, 1888 and 2070   Myocardial infarction [facility])    Neuromuscular disorder (HCC) 1995   Neuropathy    Sleep apnea    Dr. Jess   Stroke [facility]) 2002   TIA   TIA (transient ischemic attack)    Torn meniscus 01/29/2006   Left knee.   Type 2 diabetes mellitus with hyperlipidemia [facility])    Past Surgical History:  Procedure Laterality Date   CARDIAC CATHETERIZATION  02/16/2012   [facility]   CARPAL TUNNEL RELEASE Bilateral    Left-01/15/2016.  Right-03/11/2016.   COLONOSCOPY W/ POLYPECTOMY  12/20/2016   4 mm sessile tubular adenoma w/multifoci high grade dysplasia @65  cm   CORONARY ANGIOPLASTY WITH STENT PLACEMENT  09/16/2010   MI-cardiac catheterization-1 stent-OM.  [facility]   EYE SURGERY  2017   KNEE ARTHROSCOPY Left 01/29/2006   Torn meniscus   LESION EXCISION Left 04/01/2009   Benign.  [facility] [facility]   MOHS SURGERY     Family History  Problem Relation Age of Onset   Depression Mother    Diabetes Mother    Hyperlipidemia Mother    Hypertension Mother    Hypertension Father    Depression Sister    Hyperlipidemia Sister    Hypertension Sister    Alcohol abuse Brother    Diabetes Brother    Heart attack Brother    Heart disease Brother    Hyperlipidemia Brother  Hypertension Brother    Breast cancer Maternal Grandmother    Kidney disease Maternal Grandfather    Stroke Paternal Grandmother    Stroke Paternal Grandfather    Heart disease Brother    Hyperlipidemia Brother    Hypertension Brother    Social History   Socioeconomic History   Marital status: Married    Spouse name: Not on file   Number of children: Not on file   Years of education: Not on file   Highest education level: Master's degree (e.g., MA, MS, MEng, MEd, MSW, MBA)  Occupational History   Not on file  Tobacco Use   Smoking status: Former    Current packs/day: 0.00    Average packs/day: 3.0 packs/day for 5.0 years (15.0 ttl pk-yrs)    Types: Cigarettes    Start date: 02/11/1975    Quit date: 02/11/1980    Years since quitting: 44.4   Smokeless tobacco: Never  Vaping Use   Vaping status: Never Used  Substance and Sexual Activity   Alcohol use: Yes    Alcohol/week: 2.0 standard drinks of alcohol    Types: 2 Cans of beer per week    Comment: 1-2 drinks a week   Drug use: No   Sexual activity: Not Currently  Other Topics Concern   Not on file  Social History Narrative   Marital status/children/pets: Married.   Education/employment: Masters degree.  Retired from [employer].   Safety:      -smoke alarm in the home:Yes     - wears seatbelt: Yes      Social Drivers of Corporate investment banker Strain: Low Risk   (06/27/2024)   Overall Financial Resource Strain (CARDIA)    Difficulty of Paying Living Expenses: Not hard at all  Food Insecurity: No Food Insecurity (06/27/2024)   Hunger Vital Sign    Worried About Running Out of Food in the Last Year: Never true    Ran Out of Food in the Last Year: Never true  Transportation Needs: No Transportation Needs (06/27/2024)   PRAPARE - Administrator, Civil Service (Medical): No    Lack of Transportation (Non-Medical): No  Physical Activity: Inactive (06/27/2024)   Exercise Vital Sign    Days of Exercise per Week: 0 days    Minutes of Exercise per Session: 0 min  Stress: Stress Concern Present (06/27/2024)   [employer] of Occupational Health - Occupational Stress Questionnaire    Feeling of Stress: To some extent  Social Connections: Moderately Isolated (06/27/2024)   Social Connection and Isolation Panel    Frequency of Communication with Friends and Family: More than three times a week    Frequency of Social Gatherings with Friends and Family: Three times a week    Attends Religious Services: Never    Active Member of Clubs or Organizations: No    Attends Engineer, structural: Never    Marital Status: Married    Tobacco Counseling Counseling given: Not Answered   Clinical Intake:  Pre-visit preparation completed: Yes  Pain : No/denies pain     Diabetes: No  How often do you need to have someone help you when you read instructions, pamphlets, or other written materials from your doctor or pharmacy?: 1 - Never  Interpreter Needed?: No  Information entered by :: Mliss Graff LPN   Activities of Daily Living    06/27/2024   10:08 AM  In your present state of health, do you have any difficulty  performing the following activities:  Hearing? 0  Vision? 0  Difficulty concentrating or making decisions? 0  Walking or climbing stairs? 0  Dressing or bathing? 0  Doing errands, shopping? 0  Preparing Food and eating  ? N  Using the Toilet? N  In the past six months, have you accidently leaked urine? N  Do you have problems with loss of bowel control? N  Managing your Medications? N  Managing your Finances? N  Housekeeping or managing your Housekeeping? N    Patient Care Team: Catherine Charlies LABOR, DO as PCP - General (Family Medicine) Lonni Slain, MD as PCP - Cardiology (Cardiology) Launie, Alexa Shad, MD as Referring Physician (Cardiology) Jewell, Jolene R, MD as Consulting Physician (Dermatology) Silva Juliene SAUNDERS, DPM as Consulting Physician (Podiatry) Jess Devona BIRCH, MD as Consulting Physician (Sleep Medicine) Robinson Mayo, OD as Referring Physician (Optometry)  Indicate any recent Medical Services you may have received from other than [facility] providers in the past year (date may be approximate).     Assessment:   This is a routine wellness examination for [place].  Hearing/Vision screen Hearing Screening - Comments:: Bilateral hearing aids Vision Screening - Comments:: [facility] Up to date   Goals Addressed             This Visit's Progress    Patient Stated       More active     Remain active and independent   On track      Depression Screen    06/27/2024   10:10 AM 04/09/2024    9:37 AM 10/31/2023   11:03 AM 06/22/2023   10:04 AM 04/05/2023    9:40 AM 10/19/2022    9:42 AM 09/02/2022   11:03 AM  PHQ 2/9 Scores  PHQ - 2 Score 2 2 3  0 0 2 2  PHQ- 9 Score 3 3 5   4      Fall Risk    06/27/2024   10:05 AM 04/09/2024    9:37 AM 10/31/2023   11:03 AM 06/22/2023   10:06 AM 04/05/2023    9:39 AM  Fall Risk   Falls in the past year? 0 0 0 0 0  Number falls in past yr: 0  0 0 0  Injury with Fall? 0  0 0 0  Risk for fall due to :   No Fall Risks No Fall Risks No Fall Risks  Follow up Falls evaluation completed;Education provided;Falls prevention discussed Falls evaluation completed Falls evaluation completed Falls prevention discussed;Education provided;Falls evaluation completed  Falls evaluation completed    MEDICARE RISK AT HOME: Medicare Risk at Home Any stairs in or around the home?: Yes If so, are there any without handrails?: No Home free of loose throw rugs in walkways, pet beds, electrical cords, etc?: Yes Adequate lighting in your home to reduce risk of falls?: Yes Life alert?: No Use of a cane, walker or w/c?: No Grab bars in the bathroom?: Yes Shower chair or bench in shower?: No Elevated toilet seat or a handicapped toilet?: Yes  TIMED UP AND GO:  Was the test performed?  No    Cognitive Function:        06/27/2024   10:08 AM 06/22/2023   10:06 AM 06/09/2022    9:53 AM  6CIT Screen  What Year? 0 points 0 points 0 points  What month? 0 points 0 points 0 points  What time? 0 points 0 points 0 points  Count back from 20 0 points 0  points 0 points  Months in reverse 0 points 0 points 0 points  Repeat phrase 0 points 0 points 0 points  Total Score 0 points 0 points 0 points    Immunizations Immunization History  Administered Date(s) Administered   Fluad Quad(high Dose 65+) 06/12/2021, 06/18/2024   INFLUENZA, HIGH DOSE SEASONAL PF 06/18/2016, 08/14/2018   Influenza Split 08/09/2011, 11/02/2012, 06/28/2013, 07/16/2013   Influenza, Seasonal, Injecte, Preservative Fre 09/30/2015, 08/08/2017, 06/13/2019   Influenza-Unspecified 07/13/2022, 06/28/2023   PFIZER(Purple Top)SARS-COV-2 Vaccination 11/23/2019, 12/14/2019, 07/15/2020   Pfizer Covid-19 Vaccine Bivalent Booster 43yrs & up 10/13/2021   Pneumococcal Conjugate-13 09/30/2015   Pneumococcal Polysaccharide-23 07/16/2013   Tdap 10/16/2012, 10/19/2022   Zoster Recombinant(Shingrix) 06/13/2019, 08/14/2019    TDAP status: Up to date  Flu Vaccine status: Up to date  Pneumococcal vaccine status: Due, Education has been provided regarding the importance of this vaccine. Advised may receive this vaccine at local pharmacy or Health Dept. Aware to provide a copy of the vaccination record if  obtained from local pharmacy or Health Dept. Verbalized acceptance and understanding.  Covid-19 vaccine status: Information provided on how to obtain vaccines.   Qualifies for Shingles Vaccine? No   Zostavax completed Yes   Shingrix Completed?: Yes  Screening Tests Health Maintenance  Topic Date Due   Colonoscopy  08/14/2024   OPHTHALMOLOGY EXAM  07/14/2024   HEMOGLOBIN A1C  10/10/2024   FOOT EXAM  10/30/2024   Diabetic kidney evaluation - eGFR measurement  03/10/2025   Diabetic kidney evaluation - Urine ACR  04/09/2025   Medicare Annual Wellness (AWV)  06/27/2025   DTaP/Tdap/Td (3 - Td or Tdap) 10/19/2032   Pneumococcal Vaccine: 50+ Years  Completed   Influenza Vaccine  Completed   Hepatitis C Screening  Completed   Zoster Vaccines- Shingrix  Completed   HPV VACCINES  Aged Out   Meningococcal B Vaccine  Aged Out   COVID-19 Vaccine  Discontinued    Health Maintenance  Health Maintenance Due  Topic Date Due   Colonoscopy  08/14/2024    Colonoscopy  Patient is due , will discuss with MD at upcoming appointment  Lung Cancer Screening: (Low Dose CT Chest recommended if Age 45-80 years, 20 pack-year currently smoking OR have quit w/in 15years.) does not qualify.   Lung Cancer Screening Referral:   Additional Screening:  Hepatitis C Screening: does not qualify; Completed 2022  Vision Screening: Recommended annual ophthalmology exams for early detection of glaucoma and other disorders of the eye. Is the patient up to date with their annual eye exam?  Yes  Who is the provider or what is the name of the office in which the patient attends annual eye exams? [facility] If pt is not established with a provider, would they like to be referred to a provider to establish care? No .   Dental Screening: Recommended annual dental exams for proper oral hygiene    Community Resource Referral / Chronic Care Management: CRR required this visit?  No   CCM required this visit?  No      Plan:     I have personally reviewed and noted the following in the patient's chart:   Medical and social history Use of alcohol, tobacco or illicit drugs  Current medications and supplements including opioid prescriptions. Patient is not currently taking opioid prescriptions. Functional ability and status Nutritional status Physical activity Advanced directives List of other physicians Hospitalizations, surgeries, and ER visits in previous 12 months Vitals Screenings to include cognitive, depression, and falls  Referrals and appointments  In addition, I have reviewed and discussed with patient certain preventive protocols, quality metrics, and best practice recommendations. A written personalized care plan for preventive services as well as general preventive health recommendations were provided to patient.     Mliss Graff, LPN   [id number]   After Visit Summary: (MyChart) Due to this being a telephonic visit, the after visit summary with patients personalized plan was offered to patient via MyChart   Nurse Notes:

## 2024-07-13 ENCOUNTER — Other Ambulatory Visit (HOSPITAL_BASED_OUTPATIENT_CLINIC_OR_DEPARTMENT_OTHER): Payer: Self-pay | Admitting: Cardiology

## 2024-07-13 DIAGNOSIS — E782 Mixed hyperlipidemia: Secondary | ICD-10-CM

## 2024-07-13 DIAGNOSIS — I2510 Atherosclerotic heart disease of native coronary artery without angina pectoris: Secondary | ICD-10-CM

## 2024-07-16 ENCOUNTER — Encounter (HOSPITAL_BASED_OUTPATIENT_CLINIC_OR_DEPARTMENT_OTHER): Payer: Self-pay

## 2024-07-16 NOTE — Progress Notes (Signed)
 Cardiology Office Note:  .    Date:  07/17/2024  ID:  Juan Stanton, DOB 06-26-1946, MRN [id number] PCP: Juan Stanton  [facility] Providers Cardiologist:  Shelda Bruckner, MD     History of Present Illness: .    Juan Stanton is Stanton 78 y.o. male with Stanton hx of TIA (2002), myocardial infarction (2011), coronary artery disease s/p PCI x2, hypertension, hyperlipidemia, type 2 diabetes mellitus, GERD, and sleep apnea on CPAP, who is seen for follow-up today. I initially met him 07/12/2022 as Stanton new consult at the request of Juan Stanton for the evaluation and management of atherosclerosis.   Previously followed by Juan Stanton, last seen 09/2021. Moved here about 2 years prior, last cath was for abnormal stress test in 2019 (stent as above). Did not react well to lexiscan in the past. Has had difficulty getting his heart rate to target on the treadmill.  He states he had Stanton prior heart attack in 2011. He had Stanton stent put in because of Stanton 100% blockage. In 2019 he had another stent put in because of Stanton 70% blockage in his right coronary artery. He reports that back in 2011 around his heart attack was the last time he felt chest tightness.    Based on his stent cards: Stents [place] in [place], [place], Dr. Diego Blanch, [facility] 2011: stent placed OM, 100% blockage. Xience 2.25 x 18 mm 2019: stent placed RCA. Emerge 2 x 12 mm   Today: Overall doing well. We reviewed his lipid results. Specifically discussed TG at length, how diet/exercise affect this. He is largely sedentary, does eat carbs. We reviewed options to make healthy changes. No limitations/symptoms with activity but doesn't push himself. 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 Can walk, climb stairs, more limited by neuropathy.  Discussed antiplatelet again today, see below. Bruises easily.  Doing better on Mounjaro  than Ozempic .   ROS:  Denies chest pain, shortness of breath at rest  or with normal exertion. No PND, orthopnea, LE edema or unexpected weight gain. No syncope or palpitations. ROS otherwise negative except as noted.   Studies Reviewed: SABRA    EKG Interpretation Date/Time:  Tuesday July 17 2024 10:13:07 EDT Ventricular Rate:  76 PR Interval:  150 QRS Duration:  160 QT Interval:  432 QTC Calculation: 486 R Axis:   -18  Text Interpretation: Sinus rhythm with occasional Premature ventricular complexes Right bundle branch block Inferior infarct , age undetermined When compared with ECG of 14-Jul-2023 10:10, No significant change was found Confirmed by Bruckner Shelda [phone]) on 07/17/2024 10:30:16 AM     Physical Exam:    VS:  BP 112/80   Pulse 80   Ht 5' 9 (1.753 m)   Wt 260 lb 12.8 oz (118.3 kg)   SpO2 95%   BMI 38.51 kg/m    Wt Readings from Last 3 Encounters:  07/17/24 260 lb 12.8 oz (118.3 kg)  06/27/24 256 lb (116.1 kg)  05/31/24 256 lb 6.4 oz (116.3 kg)    GEN: Well nourished, well developed in no acute distress HEENT: Normal, moist mucous membranes NECK: No JVD CARDIAC: regular rhythm, normal S1 and S2, no rubs or gallops. No murmur. VASCULAR: Radial and DP pulses 2+ bilaterally. No carotid bruits RESPIRATORY:  Clear to auscultation without rales, wheezing or rhonchi  ABDOMEN: Soft, non-tender, non-distended MUSCULOSKELETAL:  Ambulates independently SKIN: Warm and dry, no edema NEUROLOGIC:  Alert and oriented x 3.  No focal neuro deficits noted. PSYCHIATRIC:  Normal affect   ASSESSMENT AND PLAN: .    Coronary artery disease History of MI 2011 ([place]) s/p PCI History of PCI ([place]) 2019 Family history of heart disease History of TIA Mixed hyperlipidemia -active, denies angina -currently on aspirin 81 mg daily, clopidogrel  75 mg daily, atorvastatin  80 mg daily -reviewed recommendations for use of SL nitroglycerin  -discussed dropping to single antiplatelet. He is having more bruising, so will drop clopidogrel  today -last LDL 40  10/31/23, at goal of <55 given diabetes. Continue atorvastatin  80 mg daily and ezetimibe  10 mg today -discussed TG, what affects them, ways to manage. Discussed lifestyle, he will focus on this and we will recheck after lifestyle changes -discussed vascepa if TG not less than 150 on recheck in Stanton few months -discussed lipoprotein Stanton, he is amenable   Type II diabetes -doing better on Mounjaro  than Ozempic    Hypertension -on candesartan -HCTZ 16-12.5 mg daily   OSA -continue CPAP   Cardiac risk counseling and prevention recommendations: -recommend heart healthy/Mediterranean diet, with whole grains, fruits, vegetable, fish, lean meats, nuts, and olive oil. Limit salt. -recommend moderate walking, 3-5 times/week for 30-50 minutes each session. Aim for at least 150 minutes.week. Goal should be pace of 3 miles/hours, or walking 1.5 miles in 30 minutes -recommend avoidance of tobacco products. Avoid excess alcohol.  Dispo: Follow-up in 1 year, or sooner as needed.  Signed, Shelda Bruckner, MD

## 2024-07-17 ENCOUNTER — Ambulatory Visit (INDEPENDENT_AMBULATORY_CARE_PROVIDER_SITE_OTHER): Admitting: Cardiology

## 2024-07-17 ENCOUNTER — Encounter (HOSPITAL_BASED_OUTPATIENT_CLINIC_OR_DEPARTMENT_OTHER): Payer: Self-pay | Admitting: Cardiology

## 2024-07-17 VITALS — BP 112/80 | HR 80 | Ht 69.0 in | Wt 260.8 lb

## 2024-07-17 DIAGNOSIS — E782 Mixed hyperlipidemia: Secondary | ICD-10-CM | POA: Diagnosis not present

## 2024-07-17 DIAGNOSIS — I2510 Atherosclerotic heart disease of native coronary artery without angina pectoris: Secondary | ICD-10-CM | POA: Diagnosis not present

## 2024-07-17 DIAGNOSIS — Z955 Presence of coronary angioplasty implant and graft: Secondary | ICD-10-CM

## 2024-07-17 DIAGNOSIS — Z7189 Other specified counseling: Secondary | ICD-10-CM

## 2024-07-17 DIAGNOSIS — E1159 Type 2 diabetes mellitus with other circulatory complications: Secondary | ICD-10-CM

## 2024-07-17 DIAGNOSIS — I10 Essential (primary) hypertension: Secondary | ICD-10-CM

## 2024-07-17 DIAGNOSIS — G4733 Obstructive sleep apnea (adult) (pediatric): Secondary | ICD-10-CM

## 2024-07-17 NOTE — Patient Instructions (Addendum)
 Stop plavix  (clopidogrel ) but continue aspirin.  Medication Instructions:  Stop clopidogrel  (Plavix )   Continue all other medications  *If you need a refill on your cardiac medications before your next appointment, please call your pharmacy*  Lab Work: Lp(a) when you go to next PCP appointment for blood work  Follow-Up: At [facility], you and your health needs are our priority.  As part of our continuing mission to provide you with exceptional heart care, our providers are all part of one team.  This team includes your primary Cardiologist (physician) and Advanced Practice Providers or APPs (Physician Assistants and Nurse Practitioners) who all work together to provide you with the care you need, when you need it.  Your next appointment:   12 month(s)  Provider:   Shelda Bruckner, MD, Rosaline Bane, NP, or Reche Finder, NP

## 2024-08-22 ENCOUNTER — Emergency Department (HOSPITAL_BASED_OUTPATIENT_CLINIC_OR_DEPARTMENT_OTHER)
Admission: EM | Admit: 2024-08-22 | Discharge: 2024-08-22 | Disposition: A | Discharge status: Home / Self Care | Attending: Emergency Medicine | Admitting: Emergency Medicine

## 2024-08-22 ENCOUNTER — Other Ambulatory Visit: Payer: Self-pay

## 2024-08-22 ENCOUNTER — Emergency Department (HOSPITAL_BASED_OUTPATIENT_CLINIC_OR_DEPARTMENT_OTHER)

## 2024-08-22 DIAGNOSIS — S50812A Abrasion of left forearm, initial encounter: Secondary | ICD-10-CM

## 2024-08-22 DIAGNOSIS — W11XXXA Fall on and from ladder, initial encounter: Secondary | ICD-10-CM | POA: Insufficient documentation

## 2024-08-22 DIAGNOSIS — S5012XA Contusion of left forearm, initial encounter: Secondary | ICD-10-CM | POA: Insufficient documentation

## 2024-08-22 DIAGNOSIS — S59912A Unspecified injury of left forearm, initial encounter: Secondary | ICD-10-CM | POA: Diagnosis present

## 2024-08-22 DIAGNOSIS — Z7982 Long term (current) use of aspirin: Secondary | ICD-10-CM | POA: Diagnosis not present

## 2024-08-22 MED ORDER — ACETAMINOPHEN 500 MG PO TABS
1000.0000 mg | ORAL_TABLET | Freq: Once | ORAL | Status: AC
  Administered 2024-08-22: 1000 mg via ORAL
  Filled 2024-08-22: qty 2

## 2024-08-22 NOTE — ED Provider Notes (Signed)
 [place] EMERGENCY DEPARTMENT AT [facility] Provider Note   CSN: [id number] Arrival date & time: 08/22/24  0830     Patient presents with: Arm Injury   Juan Stanton is a 78 y.o. male.   Patient with c/o fall ~ 2 weeks ago from ladder - pt thought he was on first step, but was actually on 2nd step, and when he went to step off ladder he lost balance and fell backwards hitting left mid forearm on a piece of furniture - had contusion/abrasion to area then. Indicates abrasion is healed, but if/when he leans onto the forearm he still has pain in mid forearm area. No shoulder or elbow pain. No wrist pain. Denies head injury or loc. No headache. No neck or back pain. Denies other any pain or injury. Indicates tetanus is up to date within past ten years. No numbness/weakness or loss of normal function.   The history is provided by the patient and medical records.  Arm Injury Associated symptoms: no back pain, no fever and no neck pain        Prior to Admission medications   Medication Sig Start Date End Date Taking? Authorizing Provider  allopurinol  (ZYLOPRIM ) 300 MG tablet TAKE 1 TABLET BY MOUTH DAILY 05/02/24   Kuneff, Renee A, DO  aspirin 81 MG chewable tablet Chew by mouth daily.    [provider]  atorvastatin  (LIPITOR) 80 MG tablet Take 1 tablet (80 mg total) by mouth daily. 10/31/23   Kuneff, Renee A, DO  bimatoprost (LUMIGAN) 0.03 % ophthalmic solution 1 drop at bedtime.    [provider]  bisacodyl  (DULCOLAX) 10 MG suppository Place 1 suppository (10 mg total) rectally as needed for moderate constipation. 03/15/24   Kuneff, Renee A, DO  brimonidine-timolol (COMBIGAN) 0.2-0.5 % ophthalmic solution Place 1 drop into both eyes every 12 (twelve) hours.    [provider]  candesartan -hydrochlorothiazide (ATACAND  HCT) 16-12.5 MG tablet TAKE 1 TABLET BY MOUTH DAILY 05/02/24   Kuneff, Renee A, DO  Coenzyme Q10 (CO Q-10) 100 MG CAPS     [provider]  cyclobenzaprine  (FLEXERIL ) 10 MG tablet Take 1 tablet (10 mg total) by mouth 2 (two) times daily as needed for muscle spasms. 03/10/24   Jerrol Agent, MD  DULoxetine  (CYMBALTA ) 60 MG capsule Take 1 capsule (60 mg total) by mouth daily. 04/09/24   Kuneff, Renee A, DO  ezetimibe  (ZETIA ) 10 MG tablet TAKE 1 TABLET BY MOUTH DAILY 07/17/24   Lonni Slain, MD  fluticasone  (FLONASE ) 50 MCG/ACT nasal spray USE 1 SPRAY IN BOTH NOSTRILS  DAILY 05/16/23   Kuneff, Renee A, DO  furosemide  (LASIX ) 20 MG tablet 1 tab po for 2 days, then as needed for edema 03/12/24   Kuneff, Renee A, DO  HYDROcodone -acetaminophen  (NORCO/VICODIN) 5-325 MG tablet Take 1-2 tablets by mouth every 6 (six) hours as needed. 03/10/24   Jerrol Agent, MD  Immune Globulin, Human,-ifas (PANZYGA IV) Inject into the vein.    [provider]  lactulose  (CEPHULAC ) 20 g packet Take 1 packet (20 g total) by mouth 2 (two) times daily. 03/15/24   Kuneff, Renee A, DO  levothyroxine  (SYNTHROID ) 150 MCG tablet Take 1 tablet (150 mcg total) by mouth daily. 11/01/23   Kuneff, Renee A, DO  lidocaine  4 % Place 1 patch onto the skin daily. Remove & Discard patch within 12 hours or as directed by MD 03/10/24   Jerrol Agent, MD  MOUNJARO  7.5 MG/0.5ML Pen INJECT THE CONTENTS OF  ONE PEN  SUBCUTANEOUSLY WEEKLY AS  DIRECTED 01/09/24   Kuneff, Renee A, DO  Multiple Vitamin (MULTIVITAMIN) capsule Take 1 capsule by mouth daily.    [provider]  nitroGLYCERIN  (NITROSTAT ) 0.4 MG SL tablet Place 1 tablet (0.4 mg total) under the tongue every 5 (five) minutes as needed for chest pain. 07/12/22   Lonni Slain, MD  pantoprazole  (PROTONIX ) 40 MG tablet Take 1 tablet (40 mg total) by mouth daily. 04/09/24   Kuneff, Renee A, DO  pregabalin  (LYRICA ) 150 MG capsule Take 1 capsule (150 mg total) by mouth 2 (two) times daily. 04/09/24   Kuneff, Renee A, DO  sennosides-docusate sodium  (SENOKOT-S) 8.6-50 MG tablet Take 2 tablets by  mouth daily. 03/15/24   Kuneff, Renee A, DO  tirzepatide  (MOUNJARO ) 10 MG/0.5ML Pen Inject 10 mg into the skin once a week. 04/09/24   Kuneff, Renee A, DO  tirzepatide  (MOUNJARO ) 12.5 MG/0.5ML Pen Inject 12.5 mg into the skin once a week. 05/28/24   Kuneff, Renee A, DO    Allergies: Celebrex [celecoxib], Iodine, and Regadenoson    Review of Systems  Constitutional:  Negative for fever.  Musculoskeletal:  Negative for back pain and neck pain.  Neurological:  Negative for weakness, numbness and headaches.    Updated Vital Signs BP (!) 136/91 (BP Location: Right Arm)   Pulse 74   Temp 98.7 F (37.1 C) (Oral)   Resp 18   SpO2 99%   Physical Exam Vitals and nursing note reviewed.  Constitutional:      Appearance: Normal appearance. He is well-developed.  HENT:     Head: Atraumatic.  Neck:     Trachea: No tracheal deviation.  Cardiovascular:     Rate and Rhythm: Normal rate.     Pulses: Normal pulses.  Pulmonary:     Effort: Pulmonary effort is normal. No accessory muscle usage or respiratory distress.  Musculoskeletal:        General: No swelling.     Comments: Mild tenderness mid left forearm. Superficial abrasion is healing without sign of infection. There is no severe swelling to forearm. Compartments of forearm and very soft, not tense. Radial pulse is palp. No pain w passive rom at elbow or wrist.   Skin:    General: Skin is warm and dry.     Findings: No rash.  Neurological:     Mental Status: He is alert.     Comments: Alert, speech clear. LUE/forearm is nvi.    Psychiatric:        Mood and Affect: Mood normal.     (all labs ordered are listed, but only abnormal results are displayed) Labs Reviewed - No data to display  EKG: None  Radiology: DG Forearm Left Result Date: 08/22/2024 EXAM: 2 VIEW(S) Xray of the Left Forearm 08/22/2024 09:11:07 AM COMPARISON: None available. CLINICAL HISTORY: Fall, pain FINDINGS: FINDINGS: BONES AND JOINTS: No acute fracture. No  focal osseous lesion. No joint dislocation. SOFT TISSUES: The soft tissues are unremarkable. IMPRESSION: 1. No significant abnormality. Electronically signed by: Evalene Coho MD 08/22/2024 09:39 AM EST RP Workstation: [id number]     Procedures   Medications Ordered in the ED - No data to display                                  Medical Decision Making Problems Addressed: Abrasion of left forearm, initial encounter: acute illness or injury Contusion of left forearm,  initial encounter: acute illness or injury Fall from ladder, initial encounter: acute illness or injury  Amount and/or Complexity of Data Reviewed External Data Reviewed: notes. Radiology: ordered and independent interpretation performed. Decision-making details documented in ED Course.  Risk OTC drugs.   Imaging ordered.   Reviewed nursing notes and prior charts for additional history.   Xrays reviewed/interpreted by me - no fx.   Acetaminophen  po.  Pt appears stable for ED d/c.          Final diagnoses:  None    ED Discharge Orders     None          Bernard Drivers, MD 08/22/24 [phone]

## 2024-08-22 NOTE — Discharge Instructions (Signed)
 It was our pleasure to provide your ER care today - we hope that you feel better.  Take acetaminophen  or ibuprofen as need.   Return to ER if worse, new symptoms, infection of wound, new/severe swelling, spreading redness, fevers, severe pain, or other concern.

## 2024-08-22 NOTE — ED Triage Notes (Signed)
 States fell off ladder 2 weeks ago, c/o left arm pain.

## 2024-08-28 ENCOUNTER — Other Ambulatory Visit: Payer: Self-pay | Admitting: Family Medicine

## 2024-09-03 ENCOUNTER — Other Ambulatory Visit: Payer: Self-pay | Admitting: Medical Genetics

## 2024-09-10 ENCOUNTER — Ambulatory Visit: Admitting: Family Medicine

## 2024-09-10 ENCOUNTER — Encounter: Payer: Self-pay | Admitting: Family Medicine

## 2024-09-10 VITALS — BP 128/80 | HR 78 | Temp 98.3°F | Wt 263.2 lb

## 2024-09-10 DIAGNOSIS — I252 Old myocardial infarction: Secondary | ICD-10-CM

## 2024-09-10 DIAGNOSIS — I700 Atherosclerosis of aorta: Secondary | ICD-10-CM | POA: Diagnosis not present

## 2024-09-10 DIAGNOSIS — I10 Essential (primary) hypertension: Secondary | ICD-10-CM

## 2024-09-10 DIAGNOSIS — E039 Hypothyroidism, unspecified: Secondary | ICD-10-CM | POA: Diagnosis not present

## 2024-09-10 DIAGNOSIS — D6869 Other thrombophilia: Secondary | ICD-10-CM

## 2024-09-10 DIAGNOSIS — E785 Hyperlipidemia, unspecified: Secondary | ICD-10-CM

## 2024-09-10 DIAGNOSIS — I2510 Atherosclerotic heart disease of native coronary artery without angina pectoris: Secondary | ICD-10-CM

## 2024-09-10 DIAGNOSIS — M1A4710 Other secondary chronic gout, right ankle and foot, without tophus (tophi): Secondary | ICD-10-CM

## 2024-09-10 DIAGNOSIS — Z8673 Personal history of transient ischemic attack (TIA), and cerebral infarction without residual deficits: Secondary | ICD-10-CM

## 2024-09-10 DIAGNOSIS — K219 Gastro-esophageal reflux disease without esophagitis: Secondary | ICD-10-CM | POA: Diagnosis not present

## 2024-09-10 DIAGNOSIS — E559 Vitamin D deficiency, unspecified: Secondary | ICD-10-CM

## 2024-09-10 DIAGNOSIS — Z955 Presence of coronary angioplasty implant and graft: Secondary | ICD-10-CM

## 2024-09-10 DIAGNOSIS — E1169 Type 2 diabetes mellitus with other specified complication: Secondary | ICD-10-CM

## 2024-09-10 LAB — POCT GLYCOSYLATED HEMOGLOBIN (HGB A1C)
HbA1c POC (<> result, manual entry): 5.4 % (ref 4.0–5.6)
HbA1c, POC (controlled diabetic range): 5.4 % (ref 0.0–7.0)
HbA1c, POC (prediabetic range): 5.4 % — AB (ref 5.7–6.4)
Hemoglobin A1C: 5.4 % (ref 4.0–5.6)

## 2024-09-10 MED ORDER — ALLOPURINOL 300 MG PO TABS: 300.0000 mg | ORAL_TABLET | Freq: Every day | ORAL | 1 refills | Status: AC

## 2024-09-10 MED ORDER — MOUNJARO 12.5 MG/0.5ML ~~LOC~~ SOAJ: 12.5000 mg | SUBCUTANEOUS | 1 refills | Status: AC

## 2024-09-10 MED ORDER — PANTOPRAZOLE SODIUM 40 MG PO TBEC: 40.0000 mg | DELAYED_RELEASE_TABLET | Freq: Every day | ORAL | 1 refills | Status: AC

## 2024-09-10 MED ORDER — LEVOTHYROXINE SODIUM 150 MCG PO TABS: 150.0000 ug | ORAL_TABLET | Freq: Every day | ORAL | 1 refills | Status: AC

## 2024-09-10 MED ORDER — PREGABALIN 150 MG PO CAPS: 150.0000 mg | ORAL_CAPSULE | Freq: Two times a day (BID) | ORAL | 1 refills | Status: AC

## 2024-09-10 MED ORDER — CANDESARTAN CILEXETIL-HCTZ 16-12.5 MG PO TABS: 1.0000 | ORAL_TABLET | Freq: Every day | ORAL | 1 refills | Status: AC

## 2024-09-10 MED ORDER — DULOXETINE HCL 60 MG PO CPEP: 60.0000 mg | ORAL_CAPSULE | Freq: Every day | ORAL | 1 refills | Status: AC

## 2024-09-10 MED ORDER — ATORVASTATIN CALCIUM 80 MG PO TABS: 80.0000 mg | ORAL_TABLET | Freq: Every day | ORAL | 3 refills | Status: AC

## 2024-09-10 NOTE — Progress Notes (Signed)
 Patient ID: Juan Stanton, male  DOB: March 10, 1946, 78 y.o.   MRN: [id number] Patient Care Team    Relationship Specialty Notifications Start End  Catherine Charlies LABOR, DO PCP - General Family Medicine  03/03/21   Lonni Slain, MD PCP - Cardiology Cardiology  07/12/22   Launie Alexa Shad, MD Referring Physician Cardiology  03/02/21   Bonney Melanie SAUNDERS, MD Consulting Physician Dermatology  03/02/21   Silva Juliene SAUNDERS, DPM Consulting Physician Podiatry  03/02/21   Jess Devona BIRCH, MD Consulting Physician Sleep Medicine  03/02/21   Robinson Mayo, OD Referring Physician Optometry  10/27/22     Chief Complaint  Patient presents with   Diabetes    Chronic Conditions/illness Management Pt is fasting.  Retina exam records-rr    Subjective: Juan Stanton is a 78 y.o.  male present for Chronic Conditions/illness Management  Gastroesophageal reflux disease, unspecified whether esophagitis present Patient reports condition is stable as long as he remains on Protonix  40 mg daily.  Patient is compliant with medications.  No complaints  Acquired hypothyroidism Patient reports compliance with 150 mg of levothyroxine  daily.  Labs up-to-date due next visit  Obstructive sleep apnea (adult) (pediatric) Managed by pulmonology.  Spinal stenosis of lumbar region without neurogenic claudication/Peripheral neuropathy, hereditary/idiopathic Patient reports compliance with 150 Lyrica  twice daily.  He has continued the B12.  B12 responded well per lab. Pt feels medication has been very helpfull  He has started seeing a neuropathy specialist at the chiropractic office. Prior note He reports he has neuropathy of both his distal lower extremities and distal upper extremities.  He states he has been worked up by neurology and there is an unknown cause of his neuropathy.  He has been prescribed Cymbalta  90 mg daily and Lyrica  100 mg twice daily.  He states these are both very helpful, however he believes his  neuropathy has been worsening over the last few months.  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  Patient denies chest pain, shortness of breath, dizziness or lower extremity edema.  Pt is  prescribed statin.  Patient is on chronic anticoagulation with aspirin/Plavix   Diabetes/obesity Pt reports compliance with mounjaro  12.5 mg weekly.He is controlling his A1c well Ozempic  2 mg weekly did not help with his weight loss. He has appreciated weight loss with mounjaro  Patient denies dizziness, hyperglycemic or hypoglycemic events. Patient denies numbness, tingling in the extremities or nonhealing wounds of feet.      09/10/2024    9:50 AM 06/27/2024   10:10 AM 04/09/2024    9:37 AM  Depression screen PHQ 2/9  Decreased Interest 1 1 1   Down, Depressed, Hopeless 1 1 1   PHQ - 2 Score 2 2 2   Altered sleeping 1 1 1   Tired, decreased energy 1 0 0  Change in appetite 0 0 0  Feeling bad or failure about yourself  0 0 0  Trouble concentrating 0 0 0  Moving slowly or fidgety/restless 0 0 0  Suicidal thoughts 0 0 0  PHQ-9 Score 4 3  3    Difficult doing work/chores Not difficult at all Not difficult at all Not difficult at all     Data saved with a previous flowsheet row definition  06/22/2023   10:06 AM 10/31/2023   11:03 AM 04/09/2024    9:37 AM 06/27/2024   10:05 AM 09/10/2024    9:49 AM  Fall Risk  Falls in the past year? 0 0 0 0 1  Was there an injury with Fall? 0  0   0  1  Fall Risk Category Calculator 0 0  0 2  Patient at Risk for  Falls Due to No Fall Risks No Fall Risks   No Fall Risks  Fall risk Follow up Falls prevention discussed;Education provided;Falls evaluation completed Falls evaluation completed Falls evaluation completed Falls evaluation completed;Education provided;Falls prevention discussed Falls evaluation completed;Education provided;Falls prevention discussed     Data saved with a previous flowsheet row definition    BP 128/80   Pulse 78   Temp 98.3 F (36.8 C)   Wt 263 lb 3.2 oz (119.4 kg)   SpO2 97%   BMI 38.87 kg/m  Physical Exam Vitals and nursing note reviewed. Exam conducted with a chaperone present.  Constitutional:      General: He is not in acute distress.    Appearance: Normal appearance. He is not ill-appearing, toxic-appearing or diaphoretic.  HENT:     Head: Normocephalic and atraumatic.  Eyes:     General: No scleral icterus.       Right eye: No discharge.        Left eye: No discharge.     Extraocular Movements: Extraocular movements intact.     Pupils: Pupils are equal, round, and reactive to light.  Cardiovascular:     Rate and Rhythm: Normal rate and regular rhythm.  Pulmonary:     Effort: Pulmonary effort is normal. No respiratory distress.     Breath sounds: Normal breath sounds. No wheezing, rhonchi or rales.  Musculoskeletal:     Right lower leg: No edema.     Left lower leg: No edema.  Skin:    General: Skin is warm.     Findings: No rash.  Neurological:     Mental Status: He is alert and oriented to person, place, and time. Mental status is at baseline.  Psychiatric:        Mood and Affect: Mood normal.        Behavior: Behavior normal.        Thought Content: Thought content normal.        Judgment: Judgment normal.    Lab Results  Component Value Date   HGBA1C 5.4 09/10/2024   HGBA1C 5.4 09/10/2024   HGBA1C 5.4 (A) 09/10/2024   HGBA1C 5.4 09/10/2024    Diabetic Foot Exam - Simple   Simple Foot Form Diabetic Foot exam was performed with the  following findings: Yes 09/10/2024 10:17 AM  Visual Inspection No deformities, no ulcerations, no other skin breakdown bilaterally: Yes Sensation Testing Intact to touch and monofilament testing bilaterally: Yes Pulse Check Posterior Tibialis and Dorsalis pulse intact bilaterally: Yes Comments     Assessment/plan: Juan Stanton is a 78 y.o. male present for chronic Conditions/illness Management Vitamin D  deficiency - Vitamin D  levels up-to-date-due next visit  Neuropathy Following neurology  Gastroesophageal reflux disease, unspecified whether esophagitis present/long term PPI use Stable Continue Protonix  40 mg daily  Acquired hypothyroidism Continue levothyroxine  150 micrograms daily.   Labs up-to-date-due next visit   Obstructive sleep apnea (adult) (pediatric) Managed by pulmonology  Spinal stenosis of lumbar region without neurogenic claudication/Peripheral neuropathy, hereditary/idiopathic Stable Continue Cymbalta  60 mg daily Continue Lyrica  to 150 mg twice daily.  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 Continue  Plavix  75 mg daily Continue atorvastatin  80 mg daily. Continue candesartan /HCTZ 16-12.5 mg daily.  Monitor, if blood pressure above goal next visit would increase dose.  He reported not taking his blood pressure medicine until about 3 months ago  Obesity/diabetes type II in obese Stable Continue Mounjaro  tapering up to Mounjaro  12.5 mg  We will follow-up closer so that we can maximize the potential with  his Mounjaro   - Hemoglobin A1c> 5.5 >5.8 >5.7> 6.4>5.5 >5.4 >5.4 A1c collected  today Very tight control of his sugars are recommended given his cardiac conditions. Patient is established with podiatry Yearly eye exams encouraged-completed 06/2023-requested this year's eye exam Foot exam up-to-date 09/10/2024 Urine microalbumin collected 04/09/2024   Gout: Stable Continue allopurinol  300 mg daily   Return in about 16 weeks (around 12/31/2024) for cpe (20 min), Routine chronic condition follow-up.  Orders Placed This Encounter  Procedures   POCT HgB A1C   Meds ordered this encounter  Medications   tirzepatide  (MOUNJARO ) 12.5 MG/0.5ML Pen    Sig: Inject 12.5 mg into the skin once a week.    Dispense:  6 mL    Refill:  1    Requesting 1 year supply   allopurinol  (ZYLOPRIM ) 300 MG tablet    Sig: Take 1 tablet (300 mg total) by mouth daily.    Dispense:  90 tablet    Refill:  1    Requesting 1 year supply   candesartan -hydrochlorothiazide (ATACAND  HCT) 16-12.5 MG tablet    Sig: Take 1 tablet by mouth daily.    Dispense:  90 tablet    Refill:  1    Requesting 1 year supply   DULoxetine  (CYMBALTA ) 60 MG capsule    Sig: Take 1 capsule (60 mg total) by mouth daily.    Dispense:  90 capsule    Refill:  1    Do not request 1 yr supply   pantoprazole  (PROTONIX ) 40 MG tablet    Sig: Take 1 tablet (40 mg total) by mouth daily.    Dispense:  90 tablet    Refill:  1    Do not request 1 yr supply   levothyroxine  (SYNTHROID ) 150 MCG tablet    Sig: Take 1 tablet (150 mcg total) by mouth daily.    Dispense:  90 tablet    Refill:  1   atorvastatin  (LIPITOR) 80 MG tablet    Sig: Take 1 tablet (80 mg total) by mouth daily.    Dispense:  90 tablet    Refill:  3   pregabalin  (LYRICA ) 150 MG capsule    Sig: Take 1 capsule (150 mg total) by mouth 2 (two) times daily.    Dispense:  180 capsule    Refill:  1    Referral Orders  No referral(s) requested today     Note is dictated  utilizing voice recognition software. Although note has been proof read prior to signing, occasional typographical errors still can be missed. If any questions arise, please do not hesitate to call for verification.  Electronically signed by: Charlies Bellini, DO [facility] [place]

## 2024-09-10 NOTE — Patient Instructions (Signed)

## 2024-09-27 ENCOUNTER — Other Ambulatory Visit: Payer: Self-pay | Admitting: Family Medicine

## 2024-10-02 ENCOUNTER — Ambulatory Visit: Admitting: Podiatry

## 2024-10-02 ENCOUNTER — Ambulatory Visit (INDEPENDENT_AMBULATORY_CARE_PROVIDER_SITE_OTHER): Admitting: Podiatry

## 2024-10-02 VITALS — Ht 69.0 in | Wt 263.0 lb

## 2024-10-02 DIAGNOSIS — B351 Tinea unguium: Secondary | ICD-10-CM

## 2024-10-02 DIAGNOSIS — E1142 Type 2 diabetes mellitus with diabetic polyneuropathy: Secondary | ICD-10-CM | POA: Diagnosis not present

## 2024-10-02 DIAGNOSIS — M79674 Pain in right toe(s): Secondary | ICD-10-CM | POA: Diagnosis not present

## 2024-10-02 DIAGNOSIS — M79675 Pain in left toe(s): Secondary | ICD-10-CM

## 2024-10-02 DIAGNOSIS — L84 Corns and callosities: Secondary | ICD-10-CM

## 2024-10-02 NOTE — Progress Notes (Signed)
"  °  Subjective:  Patient ID: Juan Stanton, male    DOB: [birth year],  MRN: [id number]  Chief Complaint  Patient presents with   Diabetes    [facility]    79 y.o. male returns with the above complaint. History confirmed with patient.  Still doing well, no new issues.   Nails are thick and elongated.     Objective:  Physical Exam: warm, good capillary refill, no trophic changes or ulcerative lesions and normal DP and PT pulses.  Absent light touch sensation and protective sensation distally.  He has nail dystrophy and residual onychomycosis of all nails with thickening dystrophy and subungual debris.  Submetatarsal 5 hyperkeratotic lesion bilateral  Assessment:   1. Pain due to onychomycosis of toenails of both feet   2. Callus of foot   3. Type 2 diabetes mellitus with hyperlipidemia (HCC)          Plan:  Patient was evaluated and treated and all questions answered.  Discussed the etiology and treatment options for the condition in detail with the patient.  Prior debridement has been helpful in reducing pain and improving function. Recommended debridement of the nails today. Sharp and mechanical debridement performed of all painful and mycotic nails today. Nails debrided in length and thickness using a nail nipper.  His diabetes remains well-controlled and is overall at low risk currently    All symptomatic hyperkeratoses were safely debrided with a sterile #15 blade to patient's level of comfort without incident. We discussed preventative and palliative care of these lesions including supportive and accommodative shoegear, padding, prefabricated and custom molded accommodative orthoses, use of a pumice stone and lotions/creams daily.    Return in about 3 months (around 12/31/2024) for at risk diabetic foot care.  "

## 2024-10-03 ENCOUNTER — Other Ambulatory Visit

## 2024-10-03 DIAGNOSIS — Z006 Encounter for examination for normal comparison and control in clinical research program: Secondary | ICD-10-CM

## 2024-10-04 ENCOUNTER — Ambulatory Visit: Payer: Medicare HMO | Admitting: Dermatology

## 2024-10-12 LAB — GENECONNECT MOLECULAR SCREEN: Genetic Analysis Overall Interpretation: NEGATIVE

## 2024-10-15 ENCOUNTER — Other Ambulatory Visit (HOSPITAL_COMMUNITY): Payer: Self-pay

## 2024-10-15 ENCOUNTER — Telehealth: Payer: Self-pay

## 2024-10-15 NOTE — Telephone Encounter (Signed)
 Pharmacy Patient Advocate Encounter   Received notification from [facility] KEY that prior authorization for Mounjaro  12.5 is required/requested.   Insurance verification completed.   The patient is insured through [employer] [facility] [employer].   Per test claim: PA required; PA submitted to above mentioned insurance via Latent Key/confirmation #/EOC [id number] Status is pending

## 2024-10-26 ENCOUNTER — Other Ambulatory Visit (HOSPITAL_COMMUNITY): Payer: Self-pay

## 2024-10-26 NOTE — Telephone Encounter (Signed)
 Pharmacy Patient Advocate Encounter  Received notification from [employer] that Prior Authorization for Mounjaro  12.5 has been APPROVED from 10/15/24 to 10/15/25. Ran test claim, Copay is $63.00. This test claim was processed through [facility]- copay amounts may vary at other pharmacies due to pharmacy/plan contracts, or as the patient moves through the different stages of their insurance plan.   PA #/Case ID/Reference #: # [id number]

## 2025-01-01 ENCOUNTER — Ambulatory Visit: Admitting: Podiatry

## 2025-01-03 ENCOUNTER — Encounter: Admitting: Family Medicine

## 2025-01-09 ENCOUNTER — Encounter: Admitting: Family Medicine

## 2025-07-03 ENCOUNTER — Ambulatory Visit
